# Patient Record
Sex: Male | Born: 1945
Health system: Southern US, Community
[De-identification: ages and names within clinical notes are randomized; demographics above are authoritative.]

## PROBLEM LIST (undated history)

## (undated) DIAGNOSIS — E119 Type 2 diabetes mellitus without complications: Secondary | ICD-10-CM

## (undated) DIAGNOSIS — R519 Headache, unspecified: Secondary | ICD-10-CM

## (undated) DIAGNOSIS — R0600 Dyspnea, unspecified: Secondary | ICD-10-CM

## (undated) DIAGNOSIS — I480 Paroxysmal atrial fibrillation: Secondary | ICD-10-CM

## (undated) DIAGNOSIS — I2510 Atherosclerotic heart disease of native coronary artery without angina pectoris: Secondary | ICD-10-CM

## (undated) DIAGNOSIS — I219 Acute myocardial infarction, unspecified: Secondary | ICD-10-CM

## (undated) DIAGNOSIS — K8020 Calculus of gallbladder without cholecystitis without obstruction: Secondary | ICD-10-CM

## (undated) DIAGNOSIS — I639 Cerebral infarction, unspecified: Secondary | ICD-10-CM

## (undated) DIAGNOSIS — T4145XA Adverse effect of unspecified anesthetic, initial encounter: Secondary | ICD-10-CM

## (undated) DIAGNOSIS — N200 Calculus of kidney: Secondary | ICD-10-CM

## (undated) DIAGNOSIS — K769 Liver disease, unspecified: Secondary | ICD-10-CM

## (undated) DIAGNOSIS — G8929 Other chronic pain: Secondary | ICD-10-CM

## (undated) DIAGNOSIS — I619 Nontraumatic intracerebral hemorrhage, unspecified: Secondary | ICD-10-CM

## (undated) DIAGNOSIS — M1990 Unspecified osteoarthritis, unspecified site: Secondary | ICD-10-CM

## (undated) DIAGNOSIS — H6690 Otitis media, unspecified, unspecified ear: Secondary | ICD-10-CM

## (undated) DIAGNOSIS — R51 Headache: Secondary | ICD-10-CM

## (undated) HISTORY — DX: Type 2 diabetes mellitus without complications: E11.9

## (undated) HISTORY — PX: CARPAL TUNNEL RELEASE: SHX101

## (undated) HISTORY — DX: Nontraumatic intracerebral hemorrhage, unspecified: I61.9

## (undated) HISTORY — DX: Atherosclerotic heart disease of native coronary artery without angina pectoris: I25.10

## (undated) HISTORY — DX: Calculus of kidney: N20.0

## (undated) HISTORY — DX: Paroxysmal atrial fibrillation: I48.0

## (undated) HISTORY — PX: HERNIA REPAIR: SHX51

## (undated) HISTORY — DX: Calculus of gallbladder without cholecystitis without obstruction: K80.20

## (undated) HISTORY — PX: KNEE ARTHROSCOPY: SUR90

## (undated) HISTORY — DX: Liver disease, unspecified: K76.9

## (undated) HISTORY — DX: Acute myocardial infarction, unspecified: I21.9

---

## 1977-03-28 DIAGNOSIS — H6690 Otitis media, unspecified, unspecified ear: Secondary | ICD-10-CM

## 1977-03-28 HISTORY — DX: Otitis media, unspecified, unspecified ear: H66.90

## 1995-03-29 DIAGNOSIS — C4491 Basal cell carcinoma of skin, unspecified: Secondary | ICD-10-CM

## 1995-03-29 HISTORY — DX: Basal cell carcinoma of skin, unspecified: C44.91

## 1998-03-28 DIAGNOSIS — Z8673 Personal history of transient ischemic attack (TIA), and cerebral infarction without residual deficits: Secondary | ICD-10-CM

## 1998-03-28 HISTORY — DX: Personal history of transient ischemic attack (TIA), and cerebral infarction without residual deficits: Z86.73

## 1998-06-26 ENCOUNTER — Inpatient Hospital Stay (HOSPITAL_COMMUNITY): Admission: EM | Admit: 1998-06-26 | Discharge: 1998-07-03 | Payer: Self-pay | Admitting: Emergency Medicine

## 1998-08-21 ENCOUNTER — Ambulatory Visit (HOSPITAL_COMMUNITY): Admission: RE | Admit: 1998-08-21 | Discharge: 1998-08-21 | Payer: Self-pay | Admitting: Neurosurgery

## 1998-08-25 ENCOUNTER — Ambulatory Visit (HOSPITAL_COMMUNITY): Admission: RE | Admit: 1998-08-25 | Discharge: 1998-08-25 | Payer: Self-pay | Admitting: Neurosurgery

## 1998-08-28 ENCOUNTER — Ambulatory Visit (HOSPITAL_COMMUNITY): Admission: RE | Admit: 1998-08-28 | Discharge: 1998-08-28 | Payer: Self-pay | Admitting: Neurosurgery

## 1999-09-15 ENCOUNTER — Ambulatory Visit (HOSPITAL_COMMUNITY): Admission: RE | Admit: 1999-09-15 | Discharge: 1999-09-15 | Payer: Self-pay | Admitting: Neurosurgery

## 2001-03-28 DIAGNOSIS — I219 Acute myocardial infarction, unspecified: Secondary | ICD-10-CM

## 2001-03-28 HISTORY — DX: Acute myocardial infarction, unspecified: I21.9

## 2002-02-15 ENCOUNTER — Inpatient Hospital Stay (HOSPITAL_COMMUNITY): Admission: RE | Admit: 2002-02-15 | Discharge: 2002-02-18 | Payer: Self-pay | Admitting: Cardiology

## 2002-02-15 ENCOUNTER — Encounter: Payer: Self-pay | Admitting: Cardiology

## 2002-02-17 ENCOUNTER — Encounter: Payer: Self-pay | Admitting: *Deleted

## 2003-03-29 HISTORY — PX: CORONARY STENT PLACEMENT: SHX1402

## 2013-01-24 ENCOUNTER — Ambulatory Visit: Payer: Self-pay | Admitting: General Practice

## 2013-10-16 ENCOUNTER — Encounter (INDEPENDENT_AMBULATORY_CARE_PROVIDER_SITE_OTHER): Payer: Self-pay

## 2013-10-16 ENCOUNTER — Encounter: Payer: Self-pay | Admitting: Family Medicine

## 2013-10-16 ENCOUNTER — Ambulatory Visit (INDEPENDENT_AMBULATORY_CARE_PROVIDER_SITE_OTHER): Payer: Commercial Managed Care - HMO | Admitting: Family Medicine

## 2013-10-16 VITALS — BP 142/75 | HR 74 | Temp 98.0°F | Ht 65.75 in | Wt 227.0 lb

## 2013-10-16 DIAGNOSIS — I6990 Unspecified sequelae of unspecified cerebrovascular disease: Secondary | ICD-10-CM

## 2013-10-16 DIAGNOSIS — E785 Hyperlipidemia, unspecified: Secondary | ICD-10-CM

## 2013-10-16 DIAGNOSIS — Z0000 Encounter for general adult medical examination without abnormal findings: Secondary | ICD-10-CM

## 2013-10-16 DIAGNOSIS — I2510 Atherosclerotic heart disease of native coronary artery without angina pectoris: Secondary | ICD-10-CM

## 2013-10-16 LAB — POCT CBC
GRANULOCYTE PERCENT: 63.8 % (ref 37–80)
HCT, POC: 49.3 % (ref 43.5–53.7)
Hemoglobin: 16.3 g/dL (ref 14.1–18.1)
Lymph, poc: 2.9 (ref 0.6–3.4)
MCH, POC: 30.8 pg (ref 27–31.2)
MCHC: 33 g/dL (ref 31.8–35.4)
MCV: 93.3 fL (ref 80–97)
MPV: 9.5 fL (ref 0–99.8)
PLATELET COUNT, POC: 202 10*3/uL (ref 142–424)
POC Granulocyte: 5.9 (ref 2–6.9)
POC LYMPH PERCENT: 31.4 %L (ref 10–50)
RBC: 5.3 M/uL (ref 4.69–6.13)
RDW, POC: 12.3 %
WBC: 9.3 10*3/uL (ref 4.6–10.2)

## 2013-10-16 NOTE — Patient Instructions (Signed)
Consider Immunizations: Shingles, pneumonia, and Tdap. Review handouts on these.

## 2013-10-16 NOTE — Progress Notes (Signed)
   Subjective:    Patient ID: Daniel Short, male    DOB: 10-06-1945, 68 y.o.   MRN: [id number]  HPI 24 your gentleman here today for a physical at the request of his insurance company. He admits that he does not like to go to the doctor much generally feels well except for being overweight. The more he talks information comes out apparently has some stroke or cerebral hemorrhage hemorrhage in the past at which time coronary artery blockages were found and he had 2 stents placed he does not have any knowledge of what the insurance covered it would like to have done today but my suspicion is that would like blood glucose and lipids ordered with consideration to updating taxanes such as pneumococcal vaccine and tdap    Review of Systems  Constitutional: Negative.   HENT: Negative.   Eyes: Negative.   Respiratory: Positive for wheezing.   Cardiovascular: Negative.   Gastrointestinal: Negative.   Endocrine: Negative.   Genitourinary: Negative.   Musculoskeletal: Negative.   Skin: Negative.   Neurological: Negative.   Psychiatric/Behavioral: Negative.        Objective:   Physical Exam  Nursing note and vitals reviewed. Constitutional: He is oriented to person, place, and time. He appears well-developed and well-nourished.  Pt is obese and admits to same  HENT:  Head: Normocephalic.  Right Ear: External ear normal.  Left Ear: External ear normal.  Nose: Nose normal.  Mouth/Throat: Oropharynx is clear and moist.  Eyes: Conjunctivae and EOM are normal. Pupils are equal, round, and reactive to light.  Neck: Normal range of motion. Neck supple.  Cardiovascular: Normal rate, regular rhythm, normal heart sounds and intact distal pulses.   Pulmonary/Chest: Effort normal and breath sounds normal.  Abdominal: Soft. Bowel sounds are normal.  Musculoskeletal: Normal range of motion.  Neurological: He is alert and oriented to person, place, and time.  Skin: Skin is warm and dry.    Psychiatric: He has a normal mood and affect. His behavior is normal. Judgment and thought content normal.          Assessment & Plan:

## 2013-10-17 LAB — HEPATITIS C ANTIBODY: Hep C Virus Ab: <0.1 s/co ratio (ref 0.0–0.9)

## 2013-10-17 LAB — CMP14+EGFR
A/G RATIO: 2 (ref 1.1–2.5)
ALK PHOS: 47 IU/L (ref 39–117)
ALT: 25 IU/L (ref 0–44)
AST: 22 IU/L (ref 0–40)
Albumin: 4.3 g/dL (ref 3.6–4.8)
BUN / CREAT RATIO: 8 — AB (ref 10–22)
BUN: 9 mg/dL (ref 8–27)
CALCIUM: 10 mg/dL (ref 8.6–10.2)
CO2: 25 mmol/L (ref 18–29)
Chloride: 100 mmol/L (ref 97–108)
Creatinine, Ser: 1.06 mg/dL (ref 0.76–1.27)
GFR calc Af Amer: 83 mL/min/{1.73_m2} (ref >59)
GFR, EST NON AFRICAN AMERICAN: 72 mL/min/{1.73_m2} (ref >59)
Globulin, Total: 2.2 g/dL (ref 1.5–4.5)
Glucose: 75 mg/dL (ref 65–99)
Potassium: 4.5 mmol/L (ref 3.5–5.2)
SODIUM: 141 mmol/L (ref 134–144)
Total Bilirubin: 0.8 mg/dL (ref 0.0–1.2)
Total Protein: 6.5 g/dL (ref 6.0–8.5)

## 2013-10-17 LAB — LIPID PANEL
CHOL/HDL RATIO: 8.2 ratio — AB (ref 0.0–5.0)
Cholesterol, Total: 230 mg/dL — ABNORMAL HIGH (ref 100–199)
HDL: 28 mg/dL — AB (ref >39)
LDL Calculated: 145 mg/dL — ABNORMAL HIGH (ref 0–99)
TRIGLYCERIDES: 286 mg/dL — AB (ref 0–149)
VLDL Cholesterol Cal: 57 mg/dL — ABNORMAL HIGH (ref 5–40)

## 2013-10-21 ENCOUNTER — Other Ambulatory Visit: Payer: Self-pay | Admitting: Family Medicine

## 2013-10-22 ENCOUNTER — Telehealth: Payer: Self-pay | Admitting: Family Medicine

## 2013-10-22 MED ORDER — ATORVASTATIN CALCIUM 20 MG PO TABS: 20.0000 mg | ORAL_TABLET | Freq: Every day | ORAL | Status: DC

## 2013-10-22 NOTE — Telephone Encounter (Signed)
rx sent in per [facility] on labs

## 2013-11-27 DIAGNOSIS — E785 Hyperlipidemia, unspecified: Secondary | ICD-10-CM

## 2013-11-27 DIAGNOSIS — I2510 Atherosclerotic heart disease of native coronary artery without angina pectoris: Secondary | ICD-10-CM | POA: Insufficient documentation

## 2013-11-27 DIAGNOSIS — E1169 Type 2 diabetes mellitus with other specified complication: Secondary | ICD-10-CM | POA: Insufficient documentation

## 2013-11-27 NOTE — Progress Notes (Addendum)
   Subjective:    Patient ID: Daniel Short, male    DOB: [birth year], 68 y.o.   MRN: [id number]  HPI    Review of Systems     Objective:   Physical Exam        Assessment & Plan:  1. Coronary atherosclerosis of unspecified type of vessel, native or graft Since on statin, check labs - Lipid panel - CMP14+EGFR  2. Unspecified late effects of cerebrovascular disease No sequela  3. Routine general medical examination at a health care facility Seems to be doing well - POCT CBC - Hepatitis C antibody  4. Other and unspecified hyperlipidemia   Wardell Honour MD

## 2014-09-23 ENCOUNTER — Telehealth: Payer: Self-pay | Admitting: Family Medicine

## 2014-10-14 DIAGNOSIS — L820 Inflamed seborrheic keratosis: Secondary | ICD-10-CM | POA: Diagnosis not present

## 2014-10-14 DIAGNOSIS — D485 Neoplasm of uncertain behavior of skin: Secondary | ICD-10-CM | POA: Diagnosis not present

## 2014-11-24 ENCOUNTER — Ambulatory Visit (INDEPENDENT_AMBULATORY_CARE_PROVIDER_SITE_OTHER): Payer: Commercial Managed Care - HMO | Admitting: Family Medicine

## 2014-11-24 ENCOUNTER — Encounter: Payer: Self-pay | Admitting: Family Medicine

## 2014-11-24 VITALS — BP 146/86 | HR 77 | Temp 97.3°F | Ht 65.75 in | Wt 221.8 lb

## 2014-11-24 DIAGNOSIS — Z87891 Personal history of nicotine dependence: Secondary | ICD-10-CM | POA: Insufficient documentation

## 2014-11-24 DIAGNOSIS — Z720 Tobacco use: Secondary | ICD-10-CM

## 2014-11-24 DIAGNOSIS — Z0000 Encounter for general adult medical examination without abnormal findings: Secondary | ICD-10-CM

## 2014-11-24 DIAGNOSIS — E785 Hyperlipidemia, unspecified: Secondary | ICD-10-CM

## 2014-11-24 DIAGNOSIS — I2510 Atherosclerotic heart disease of native coronary artery without angina pectoris: Secondary | ICD-10-CM

## 2014-11-24 DIAGNOSIS — E669 Obesity, unspecified: Secondary | ICD-10-CM

## 2014-11-24 MED ORDER — ATORVASTATIN CALCIUM 20 MG PO TABS: 20.0000 mg | ORAL_TABLET | Freq: Every day | ORAL | Status: DC

## 2014-11-24 NOTE — Progress Notes (Signed)
   HPI  Patient presents today for annual physical  HLD Not taking meds Not exercising, mows once a week - has Bl leg/knee swelling (r knee >L) without paiun and general low back soreness with activity Not watching diet  Smoking- has cut back, still 3-4 per day  CAD- previous heart attack with stents X 2 No Chest pain, unusual dyspnea, or palpitations  Obesity- no exercise- diet improvements as above  PMH: Smoking status noted Past medical =, surgical, and social Hx reviewed and updated in EMR ROS: Per HPI, otherwise enegative  Objective: BP 146/86 mmHg  Pulse 77  Temp(Src) 97.3 F (36.3 C) (Oral)  Ht 5' 5.75" (1.67 m)  Wt 221 lb 12.8 oz (100.608 kg)  BMI 36.07 kg/m2 Gen: NAD, alert, cooperative with exam HEENT: NCAT, Tms and nares WNL, oropharaynx clear Neck supple- trachea midline CV: RRR, good S1/S2, no murmur Resp: CTABL, no wheezes, non-labored Abd: SNTND, BS present, no guarding or organomegaly Ext: No edema, warm Neuro: Alert and oriented, No gross deficits  Assessment and plan:  # HLD Lipitor- agree wiwth dr. Miller Repeat labs 2 moinths, check up 1 week later Diet and exercise discussed  # Tobacco abuse offerd help, and clinical pharmacist congratulated on progress He is thinking about cutting back but not ready to quit  # obesity Diet adn exercise as above  # annual physical Labs, diet and exercise Declined prostate testing- offered/discussed PSA and rectal Declined C scope    Orders Placed This Encounter  Procedures  . CMP14+EGFR    Standing Status: Future     Number of Occurrences:      Standing Expiration Date: 11/24/2015  . Lipid Panel    Standing Status: Future     Number of Occurrences:      Standing Expiration Date: 11/24/2015    Meds ordered this encounter  Medications  . atorvastatin (LIPITOR) 20 MG tablet    Sig: Take 1 tablet (20 mg total) by mouth daily.    Dispense:  30 tablet    Refill:  3    Sam Bradshaw,  MD [facility] 11/24/2014, 2:48 PM      

## 2014-11-24 NOTE — Patient Instructions (Signed)
Great to meet you!  You have high cholesterol, we have sent lipitor for you to start  We should re-check your labs in 2 months, make a lab appt and follow up in the clinic after that.   Try to cut down on fried and fatty foods.   High Cholesterol High cholesterol refers to having a high level of cholesterol in your blood. Cholesterol is a white, waxy, fat-like protein that your body needs in small amounts. Your liver makes all the cholesterol you need. Excess cholesterol comes from the food you eat. Cholesterol travels in your bloodstream through your blood vessels. If you have high cholesterol, deposits (plaque) may build up on the walls of your blood vessels. This makes the arteries narrower and stiffer. Plaque increases your risk of heart attack and stroke. Work with your health care provider to keep your cholesterol levels in a healthy range. RISK FACTORS Several things can make you more likely to have high cholesterol. These include:   Eating foods high in animal fat (saturated fat) or cholesterol.  Being overweight.  Not getting enough exercise.  Having a family history of high cholesterol. SIGNS AND SYMPTOMS High cholesterol does not cause symptoms. DIAGNOSIS  Your health care provider can do a blood test to check whether you have high cholesterol. If you are older than 20, your health care provider may check your cholesterol every 4-6 years. You may be checked more often if you already have high cholesterol or other risk factors for heart disease. The blood test for cholesterol measures the following:  Bad cholesterol (LDL cholesterol). This is the type of cholesterol that causes heart disease. This number should be less than 100.  Good cholesterol (HDL cholesterol). This type helps protect against heart disease. A healthy level of HDL cholesterol is 60 or higher.  Total cholesterol. This is the combined number of LDL cholesterol and HDL cholesterol. A healthy number is less  than 200. TREATMENT  High cholesterol can be treated with diet changes, lifestyle changes, and medicine.   Diet changes may include eating more whole grains, fruits, vegetables, nuts, and fish. You may also have to cut back on red meat and foods with a lot of added sugar.  Lifestyle changes may include getting at least 40 minutes of aerobic exercise three times a week. Aerobic exercises include walking, biking, and swimming. Aerobic exercise along with a healthy diet can help you maintain a healthy weight. Lifestyle changes may also include quitting smoking.  If diet and lifestyle changes are not enough to lower your cholesterol, your health care provider may prescribe a statin medicine. This medicine has been shown to lower cholesterol and also lower the risk of heart disease. HOME CARE INSTRUCTIONS  Only take over-the-counter or prescription medicines as directed by your health care provider.   Follow a healthy diet as directed by your health care provider. For instance:   Eat chicken (without skin), fish, veal, shellfish, ground [place] breast, and round or loin cuts of red meat.  Do not eat fried foods and fatty meats, such as hot dogs and salami.   Eat plenty of fruits, such as apples.   Eat plenty of vegetables, such as broccoli, potatoes, and carrots.   Eat beans, peas, and lentils.   Eat grains, such as barley, rice, couscous, and bulgur wheat.   Eat pasta without cream sauces.   Use skim or nonfat milk and low-fat or nonfat yogurt and cheeses. Do not eat or drink whole milk, cream, ice  cream, egg yolks, and hard cheeses.   Do not eat stick margarine or tub margarines that contain trans fats (also called partially hydrogenated oils).   Do not eat cakes, cookies, crackers, or other baked goods that contain trans fats.   Do not eat saturated tropical oils, such as coconut and palm oil.   Exercise as directed by your health care provider. Increase your activity  level with activities such as gardening or walking.   Keep all follow-up appointments.  SEEK MEDICAL CARE IF:  You are struggling to maintain a healthy diet or weight.  You need help starting an exercise program.  You need help to stop smoking. SEEK IMMEDIATE MEDICAL CARE IF:  You have chest pain.  You have trouble breathing. Document Released: 03/14/2005 Document Revised: 07/29/2013 Document Reviewed: 01/04/2013 [facility] Patient Information 2015 [place], [place]. This information is not intended to replace advice given to you by your health care provider. Make sure you discuss any questions you have with your health care provider.

## 2015-02-18 ENCOUNTER — Telehealth: Payer: Self-pay | Admitting: Family Medicine

## 2015-02-24 NOTE — Telephone Encounter (Signed)
Patient needs a flu shot and an FOBT to close quality gap for this year.

## 2015-04-16 ENCOUNTER — Ambulatory Visit (INDEPENDENT_AMBULATORY_CARE_PROVIDER_SITE_OTHER): Payer: Commercial Managed Care - HMO | Admitting: Family Medicine

## 2015-04-16 ENCOUNTER — Encounter: Payer: Self-pay | Admitting: Family Medicine

## 2015-04-16 VITALS — BP 133/84 | HR 68 | Temp 96.8°F | Ht 65.75 in | Wt 223.8 lb

## 2015-04-16 DIAGNOSIS — Z720 Tobacco use: Secondary | ICD-10-CM

## 2015-04-16 DIAGNOSIS — E785 Hyperlipidemia, unspecified: Secondary | ICD-10-CM

## 2015-04-16 DIAGNOSIS — Z0000 Encounter for general adult medical examination without abnormal findings: Secondary | ICD-10-CM | POA: Insufficient documentation

## 2015-04-16 DIAGNOSIS — I2510 Atherosclerotic heart disease of native coronary artery without angina pectoris: Secondary | ICD-10-CM | POA: Diagnosis not present

## 2015-04-16 DIAGNOSIS — R8299 Other abnormal findings in urine: Secondary | ICD-10-CM | POA: Diagnosis not present

## 2015-04-16 DIAGNOSIS — E875 Hyperkalemia: Secondary | ICD-10-CM

## 2015-04-16 DIAGNOSIS — E669 Obesity, unspecified: Secondary | ICD-10-CM

## 2015-04-16 DIAGNOSIS — R82998 Other abnormal findings in urine: Secondary | ICD-10-CM

## 2015-04-16 DIAGNOSIS — R17 Unspecified jaundice: Secondary | ICD-10-CM

## 2015-04-16 LAB — POCT URINALYSIS DIPSTICK
BILIRUBIN UA: NEGATIVE
Glucose, UA: NEGATIVE
KETONES UA: NEGATIVE
Nitrite, UA: NEGATIVE
PH UA: 6
Protein, UA: NEGATIVE
RBC UA: NEGATIVE
SPEC GRAV UA: 1.02
Urobilinogen, UA: NEGATIVE

## 2015-04-16 LAB — POCT UA - MICROSCOPIC ONLY
Bacteria, U Microscopic: NEGATIVE
CASTS, UR, LPF, POC: NEGATIVE
Crystals, Ur, HPF, POC: NEGATIVE
EPITHELIAL CELLS, URINE PER MICROSCOPY: NEGATIVE
MUCUS UA: NEGATIVE
RBC, urine, microscopic: NEGATIVE
YEAST UA: NEGATIVE

## 2015-04-16 LAB — POCT GLYCOSYLATED HEMOGLOBIN (HGB A1C): Hemoglobin A1C: 5.9

## 2015-04-16 NOTE — Progress Notes (Signed)
   HPI  Patient presents today today for follow-up and discuss back pain.  Hyperlipidemia Not really watching his diet He is taking Lipitor. No myalgias  Back pain Described as left-sided intermittent aggravating type pain that comes and goes without aggravating or alleviating factors. No radiation No bowel or bladder incontinence, leg weakness, or saddle anesthesia.  Smoking Not really interested in quitting, has cut back to less than a half a pack per day.  Healthcare maintenance Not interested in flu shots His wife has had several colonoscopies and he is beginning to think about colonoscopy. He has seen blood in his stool  Reports dark urine for a week or 2 No blood No dysuria, hematuria, foul-smelling urine, persistent back pain, fever, or current illness  He discloses that he drinks mostly sodas and eats honey buns every morning for breakfast   PMH: Smoking status noted ROS: Per HPI  Objective: BP 133/84 mmHg  Pulse 68  Temp(Src) 96.8 F (36 C) (Oral)  Ht 5' 5.75" (1.67 m)  Wt 223 lb 12.8 oz (101.515 kg)  BMI 36.40 kg/m2 Gen: NAD, alert, cooperative with exam HEENT: NCAT CV: RRR, good S1/S2, no murmur Resp: Nonlabored, inspiratory and expiratory wheezes scattered throughout Ext: No edema, warm Neuro: Alert and oriented, No gross deficits  MSK: No paraspinal muscle or bony tenderness of the low back  Assessment and plan:  # Dark urine Discussed possible dehydration He has been drinking mostly sodas, hardly any water at all  # Hyperlipidemia Continue atorvastatin Labs  # HealthCare maintenance Discussed colonoscopy at length, he will discuss this with his wife, FOBT card given  # Sacroiliac pain Given stretches for rehabilitation.   Orders Placed This Encounter  Procedures  . Lipid panel  . CMP14+EGFR  . POCT UA - Microscopic Only  . POCT urinalysis dipstick  . POCT glycosylated hemoglobin (Hb A1C)     Laroy Apple, MD [facility] 04/16/2015, 2:25 PM

## 2015-04-16 NOTE — Patient Instructions (Addendum)
Great to meet you!  Consider the colonoscopy, Check your stool for blood using the kit I sent with you  Continue your lipitor  We will call with results within 1 week  Come back in 4 months  Try the exercises for your back pain

## 2015-04-17 LAB — CMP14+EGFR
A/G RATIO: 1.8 (ref 1.1–2.5)
ALT: 25 IU/L (ref 0–44)
AST: 22 IU/L (ref 0–40)
Albumin: 4.6 g/dL (ref 3.5–4.8)
Alkaline Phosphatase: 51 IU/L (ref 39–117)
BUN/Creatinine Ratio: 8 — ABNORMAL LOW (ref 10–22)
BUN: 8 mg/dL (ref 8–27)
Bilirubin Total: 1.3 mg/dL — ABNORMAL HIGH (ref 0.0–1.2)
CALCIUM: 9.9 mg/dL (ref 8.6–10.2)
CO2: 24 mmol/L (ref 18–29)
Chloride: 100 mmol/L (ref 96–106)
Creatinine, Ser: 1.02 mg/dL (ref 0.76–1.27)
GFR, EST AFRICAN AMERICAN: 86 mL/min/{1.73_m2} (ref >59)
GFR, EST NON AFRICAN AMERICAN: 74 mL/min/{1.73_m2} (ref >59)
GLOBULIN, TOTAL: 2.6 g/dL (ref 1.5–4.5)
Glucose: 84 mg/dL (ref 65–99)
POTASSIUM: 5.4 mmol/L — AB (ref 3.5–5.2)
SODIUM: 143 mmol/L (ref 134–144)
TOTAL PROTEIN: 7.2 g/dL (ref 6.0–8.5)

## 2015-04-17 LAB — LIPID PANEL
CHOLESTEROL TOTAL: 114 mg/dL (ref 100–199)
Chol/HDL Ratio: 3.9 ratio units (ref 0.0–5.0)
HDL: 29 mg/dL — ABNORMAL LOW (ref >39)
LDL Calculated: 55 mg/dL (ref 0–99)
Triglycerides: 149 mg/dL (ref 0–149)
VLDL Cholesterol Cal: 30 mg/dL (ref 5–40)

## 2015-04-17 NOTE — Addendum Note (Signed)
Addended by: Thana Ates on: 04/17/2015 02:56 PM   Modules accepted: Orders

## 2015-08-20 ENCOUNTER — Other Ambulatory Visit: Payer: Self-pay | Admitting: Family Medicine

## 2015-10-28 ENCOUNTER — Other Ambulatory Visit: Payer: Self-pay | Admitting: Family Medicine

## 2015-11-03 ENCOUNTER — Telehealth: Payer: Self-pay | Admitting: Family Medicine

## 2015-11-03 DIAGNOSIS — Z139 Encounter for screening, unspecified: Secondary | ICD-10-CM

## 2015-11-03 NOTE — Telephone Encounter (Signed)
Pt called to request referral to Dr Denna Haggard for annual skin check referral entered in epic Pt notified

## 2015-11-06 DIAGNOSIS — L820 Inflamed seborrheic keratosis: Secondary | ICD-10-CM | POA: Diagnosis not present

## 2015-11-06 DIAGNOSIS — D485 Neoplasm of uncertain behavior of skin: Secondary | ICD-10-CM | POA: Diagnosis not present

## 2015-11-06 DIAGNOSIS — L821 Other seborrheic keratosis: Secondary | ICD-10-CM | POA: Diagnosis not present

## 2015-12-29 ENCOUNTER — Other Ambulatory Visit: Payer: Self-pay | Admitting: Pediatrics

## 2016-05-19 ENCOUNTER — Ambulatory Visit (INDEPENDENT_AMBULATORY_CARE_PROVIDER_SITE_OTHER): Payer: Medicare HMO

## 2016-05-19 ENCOUNTER — Ambulatory Visit (INDEPENDENT_AMBULATORY_CARE_PROVIDER_SITE_OTHER): Payer: Medicare HMO | Admitting: Family Medicine

## 2016-05-19 ENCOUNTER — Encounter: Payer: Self-pay | Admitting: Family Medicine

## 2016-05-19 VITALS — BP 171/100 | HR 85 | Temp 97.5°F | Ht 65.75 in | Wt 226.4 lb

## 2016-05-19 DIAGNOSIS — M5441 Lumbago with sciatica, right side: Secondary | ICD-10-CM | POA: Diagnosis not present

## 2016-05-19 DIAGNOSIS — G8929 Other chronic pain: Secondary | ICD-10-CM

## 2016-05-19 DIAGNOSIS — I10 Essential (primary) hypertension: Secondary | ICD-10-CM

## 2016-05-19 DIAGNOSIS — E1159 Type 2 diabetes mellitus with other circulatory complications: Secondary | ICD-10-CM | POA: Insufficient documentation

## 2016-05-19 DIAGNOSIS — E785 Hyperlipidemia, unspecified: Secondary | ICD-10-CM

## 2016-05-19 DIAGNOSIS — R030 Elevated blood-pressure reading, without diagnosis of hypertension: Secondary | ICD-10-CM | POA: Diagnosis not present

## 2016-05-19 DIAGNOSIS — M5442 Lumbago with sciatica, left side: Secondary | ICD-10-CM

## 2016-05-19 DIAGNOSIS — M25641 Stiffness of right hand, not elsewhere classified: Secondary | ICD-10-CM | POA: Diagnosis not present

## 2016-05-19 NOTE — Progress Notes (Signed)
HPI  Patient presents today with back pain, hand pain, and review of chronic medical conditions.  Back pain Bilateral low back pain been going on for more than one year, described as dull achy back pain worse with walking with intermittent bilateral radiation down the posterior leg. No leg weakness, numbness or laying in the legs, loss of sensation in the saddle area, or bowel or bladder dysfunction.  Hand swelling Right hand pain and swelling and numbness and tingling across the first 3 fingers for about one week. Patient states that the swelling is most predominant with pain across the posterior hand from the second to fourth MCPs. No Injury His wife is concerned it is RA  Elevated blood pressure Previously noted elevated blood pressure Denies any increased salt intake Denies chest pain, headache, or dyspnea.  Hyperlipidemia Taking statin every other day, previously stopped for hand numbness and tingling similar to his that he is experiencing currently This did not improve with stopping   PMH: Smoking status noted ROS: Per HPI  Objective: BP (!) 171/100   Pulse 85   Temp 97.5 F (36.4 C) (Oral)   Ht 5' 5.75" (1.67 m)   Wt 226 lb 6.4 oz (102.7 kg)   BMI 36.82 kg/m  Gen: NAD, alert, cooperative with exam HEENT: NCAT, EOMI, PERRL CV: RRR, good S1/S2, no murmur Resp: CTABL, no wheezes, non-labored Abd: SNTND, BS present, no guarding or organomegaly Ext: No edema, warm Neuro: Alert and oriented, No gross deficits  Assessment and plan:  # Elevated blood pressure without diagnosis of hypertension Blood pressure log, return to clinic in 4-6 weeks.  # Bilateral back pain with bilateral sciatica, chronic Plain film today Symptoms slightly worse No red flags Consider gabapentin, Cymbalta Discussed avoiding NSAIDs with history of heart disease  # Joint stiffness hand Plain film pending Trial of Tylenol Labs to rule out RA Patient's main complaint is stiffness and  pain across the 2nd through fourth MCP, consider carpal tunnel syndrome considering numbness and tingling pain, Tinel's sign is negative  hyperlipidemia [place] statin, checking labs, nonfasting so LDL was drawn    Orders Placed This Encounter  Procedures  . DG Lumbar Spine 2-3 Views    Standing Status:   Future    Number of Occurrences:   1    Standing Expiration Date:   07/19/2017    Order Specific Question:   Reason for Exam (SYMPTOM  OR DIAGNOSIS REQUIRED)    Answer:   low back pain >1 year, intermittent scaitica BL    Order Specific Question:   Preferred imaging location?    Answer:   Internal  . DG Hand Complete Right    Standing Status:   Future    Number of Occurrences:   1    Standing Expiration Date:   07/17/2017    Order Specific Question:   Reason for Exam (SYMPTOM  OR DIAGNOSIS REQUIRED)    Answer:   R hand swelling and pain X 1 week    Order Specific Question:   Preferred imaging location?    Answer:   Internal  . Sedimentation Rate  . Rheumatoid factor  . LDL Cholesterol, Direct  . CMP14+EGFR  . CBC with [place], MD [facility] 05/19/2016, 5:25 PM

## 2016-05-19 NOTE — Patient Instructions (Signed)
Great to see you!  Stop smoking, avoid ibuprofen and aleve Tylenol 2 pills 2-3 times a day for pain.   We will call with labs and x ray results within 1 week  Come back in 1-2 months with a blood pressure log

## 2016-05-20 LAB — CMP14+EGFR
ALK PHOS: 54 IU/L (ref 39–117)
ALT: 22 IU/L (ref 0–44)
AST: 21 IU/L (ref 0–40)
Albumin/Globulin Ratio: 1.8 (ref 1.2–2.2)
Albumin: 4.2 g/dL (ref 3.5–4.8)
BUN/Creatinine Ratio: 9 — ABNORMAL LOW (ref 10–24)
BUN: 8 mg/dL (ref 8–27)
Bilirubin Total: 1.1 mg/dL (ref 0.0–1.2)
CALCIUM: 9.4 mg/dL (ref 8.6–10.2)
CO2: 24 mmol/L (ref 18–29)
CREATININE: 0.89 mg/dL (ref 0.76–1.27)
Chloride: 100 mmol/L (ref 96–106)
GFR calc Af Amer: 99 (ref >59)
GFR, EST NON AFRICAN AMERICAN: 86 (ref >59)
GLOBULIN, TOTAL: 2.3 (ref 1.5–4.5)
GLUCOSE: 93 mg/dL (ref 65–99)
Potassium: 4.4 mmol/L (ref 3.5–5.2)
SODIUM: 141 mmol/L (ref 134–144)
Total Protein: 6.5 g/dL (ref 6.0–8.5)

## 2016-05-20 LAB — CBC WITH DIFFERENTIAL/PLATELET
BASOS: 1 %
Basophils Absolute: 0.1 10*3/uL (ref 0.0–0.2)
EOS (ABSOLUTE): 0.2 10*3/uL (ref 0.0–0.4)
EOS: 2 %
HEMOGLOBIN: 14.9 g/dL (ref 13.0–17.7)
Hematocrit: 44.5 % (ref 37.5–51.0)
IMMATURE GRANS (ABS): 0 10*3/uL (ref 0.0–0.1)
IMMATURE GRANULOCYTES: 0 %
Lymphocytes Absolute: 3.2 10*3/uL — ABNORMAL HIGH (ref 0.7–3.1)
Lymphs: 34 %
MCH: 30.7 pg (ref 26.6–33.0)
MCHC: 33.5 g/dL (ref 31.5–35.7)
MCV: 92 fL (ref 79–97)
MONOCYTES: 11 %
Monocytes Absolute: 1.1 10*3/uL — ABNORMAL HIGH (ref 0.1–0.9)
NEUTROS PCT: 52 %
Neutrophils Absolute: 4.9 10*3/uL (ref 1.4–7.0)
Platelets: 205 10*3/uL (ref 150–379)
RBC: 4.85 x10E6/uL (ref 4.14–5.80)
RDW: 12.8 % (ref 12.3–15.4)
WBC: 9.4 10*3/uL (ref 3.4–10.8)

## 2016-05-20 LAB — LDL CHOLESTEROL, DIRECT: LDL Direct: 82 mg/dL (ref 0–99)

## 2016-05-20 LAB — RHEUMATOID FACTOR

## 2016-05-20 LAB — SEDIMENTATION RATE: Sed Rate: 7 mm/hr (ref 0–30)

## 2016-06-21 ENCOUNTER — Ambulatory Visit (INDEPENDENT_AMBULATORY_CARE_PROVIDER_SITE_OTHER): Payer: Medicare HMO | Admitting: Family Medicine

## 2016-06-21 ENCOUNTER — Encounter: Payer: Self-pay | Admitting: Family Medicine

## 2016-06-21 VITALS — BP 142/78 | HR 87 | Temp 96.9°F | Ht 65.75 in | Wt 227.0 lb

## 2016-06-21 DIAGNOSIS — M79641 Pain in right hand: Secondary | ICD-10-CM | POA: Diagnosis not present

## 2016-06-21 DIAGNOSIS — M79642 Pain in left hand: Secondary | ICD-10-CM | POA: Diagnosis not present

## 2016-06-21 DIAGNOSIS — I10 Essential (primary) hypertension: Secondary | ICD-10-CM | POA: Diagnosis not present

## 2016-06-21 DIAGNOSIS — G8929 Other chronic pain: Secondary | ICD-10-CM

## 2016-06-21 DIAGNOSIS — M545 Low back pain: Secondary | ICD-10-CM

## 2016-06-21 MED ORDER — HYDROCHLOROTHIAZIDE 12.5 MG PO CAPS: 12.5000 mg | ORAL_CAPSULE | Freq: Every day | ORAL | 3 refills | Status: DC

## 2016-06-21 MED ORDER — GABAPENTIN 300 MG PO CAPS: 300.0000 mg | ORAL_CAPSULE | Freq: Three times a day (TID) | ORAL | 3 refills | Status: DC

## 2016-06-21 NOTE — Progress Notes (Signed)
   HPI  Patient presents today to follow-up for hypertension  Complains of hand pain, tingling pain across the knuckles on bilateral hands and swelling. Labs were negative for rheumatoid arthritis last month. Patient states his pain is been going on for several months.  Patient also has chronic back pain. Patient states he has bilateral low back pain with sciatica mostly in the mornings, he also has flares occasionally the sciatica is difficult to tolerate.  High blood pressure Patient brings in a log today blood pressures ranging 903 systolic to 833 systolic over 38V and 29V. 13 of the 25 blood pressures recorded are above 916 systolic. His wife like him to start medication.  PMH: Smoking status noted ROS: Per HPI  Objective: BP (!) 142/78   Pulse 87   Temp (!) 96.9 F (36.1 C) (Oral)   Ht 5' 5.75" (1.67 m)   Wt 227 lb (103 kg)   BMI 36.92 kg/m  Gen: NAD, alert, cooperative with exam HEENT: NCAT, EOMI, PERRL CV: RRR, good S1/S2, no murmur Resp: CTABL, no wheezes, non-labored Ext: No edema, warm Neuro: Alert and oriented, plus symmetric patellar tendon reflexes Strength 5/5 and sensation intact in bilateral lower extremities  MSK Mild tenderness to palpation in bilateral lumbar spine and the paraspinal muscles and mild tenderness over the bony prominences and lumbar area  Assessment and plan:  # Hypertension New diagnosis Start HCTZ Follow-up 3-4 weeks, repeat labs at that time  # Hand pain Most likely osteoarthritis Patient does complain of swelling and tingling type pain across the bilateral MCPs. Gabapentin also given for nighttime pain as well as possible neuropathic pain on the back No median nerve distribution, no fingertip pain, no warm erythema to cause concern for inflammatory arthritis Previous RF and ESR normal  # Back pain With intermittent bilateral sciatica Starting gabapentin Follow-up 3-4 weeks   Meds ordered this encounter  Medications  .  hydrochlorothiazide (MICROZIDE) 12.5 MG capsule    Sig: Take 1 capsule (12.5 mg total) by mouth daily.    Dispense:  30 capsule    Refill:  3  . gabapentin (NEURONTIN) 300 MG capsule    Sig: Take 1 capsule (300 mg total) by mouth 3 (three) times daily.    Dispense:  90 capsule    Refill:  [place], MD [facility] 06/21/2016, 5:26 PM

## 2016-06-21 NOTE — Patient Instructions (Signed)
Great to see you!  I have started hydrochlorothiazide for blood pressure.   I have started gabapentin for pain. Start with 1 pill once a day at night for 1 week, then add a daytime dose in the morning if it doesn't make you sleepy. After another week you can start an afternoon dose as well.   Come back in 1 month for follow up for blood pressure and pain.

## 2016-07-14 ENCOUNTER — Telehealth: Payer: Self-pay | Admitting: Family Medicine

## 2016-07-14 NOTE — Telephone Encounter (Signed)
appt scheduled Pt notified 

## 2016-07-15 ENCOUNTER — Ambulatory Visit (INDEPENDENT_AMBULATORY_CARE_PROVIDER_SITE_OTHER): Payer: Medicare HMO | Admitting: Family Medicine

## 2016-07-15 ENCOUNTER — Encounter: Payer: Self-pay | Admitting: Family Medicine

## 2016-07-15 VITALS — BP 137/80 | HR 83 | Temp 97.4°F | Ht 65.75 in | Wt 224.2 lb

## 2016-07-15 DIAGNOSIS — I10 Essential (primary) hypertension: Secondary | ICD-10-CM

## 2016-07-15 DIAGNOSIS — G5690 Unspecified mononeuropathy of unspecified upper limb: Secondary | ICD-10-CM

## 2016-07-15 DIAGNOSIS — M545 Low back pain, unspecified: Secondary | ICD-10-CM

## 2016-07-15 DIAGNOSIS — R7309 Other abnormal glucose: Secondary | ICD-10-CM

## 2016-07-15 DIAGNOSIS — M792 Neuralgia and neuritis, unspecified: Secondary | ICD-10-CM

## 2016-07-15 MED ORDER — PREGABALIN 75 MG PO CAPS: 75.0000 mg | ORAL_CAPSULE | Freq: Two times a day (BID) | ORAL | 2 refills | Status: DC

## 2016-07-15 MED ORDER — PREDNISONE 20 MG PO TABS: ORAL_TABLET | ORAL | 0 refills | Status: DC

## 2016-07-15 MED ORDER — HYDROCHLOROTHIAZIDE 25 MG PO TABS: 25.0000 mg | ORAL_TABLET | Freq: Every day | ORAL | 3 refills | Status: DC

## 2016-07-15 NOTE — Progress Notes (Signed)
   HPI  Patient presents today here for back and hand pain as well as hypertension.  Patient has started HCTZ with no improvement of the hand tightness, blood pressure still elevated. No chest pain, dyspnea, palpitations.  Low back pain, midline radiating to bilateral paraspinal muscles. Worsened with activity. Not helped by gabapentin, gabapentin causing months.  Bilateral hand pain described as tightness and tingling with burning type pain. No significant neck pain. No injury to the hands or back.  Family would like to see a specialist  PMH: Smoking status noted ROS: Per HPI  Objective: BP (!) 160/87   Pulse 81   Temp 97.4 F (36.3 C) (Oral)   Ht 5' 5.75" (1.67 m)   Wt 224 lb 3.2 oz (101.7 kg)   BMI 36.46 kg/m  Gen: NAD, alert, cooperative with exam HEENT: NCAT CV: RRR, good S1/S2, no murmur Resp: CTABL, no wheezes, non-labored Ext: No edema, warm Neuro: Alert and oriented, No gross deficits  Msk Tenderness to palpation of the midline lumbar spine, tenderness also in the paraspinal muscles.  Assessment and plan:  # Hypertension New diagnosis, titrate HCTZ to 25 mg.   # Neuropathic hand pain Bilateral, unclear etiology Burning and tingling type pain. Change from gabapentin to Lyrica, may need prior authorization. With tightness type pain and bleeding diuretic may also be helpful rheumatoid factor and sedimentation rate were normal last visit   # Low back pain Musculoskeletal back pain, previous x-ray from last visit shows diffuse degenerative changes, no acute fracture or changes. Short course of prednisone given. Trial of Lyrica Referral to orthopedics for their opinion and evaluation    Orders Placed This Encounter  Procedures  . Ambulatory referral to Orthopedic Surgery    Referral Priority:   Routine    Referral Type:   Surgical    Referral Reason:   Specialty Services Required    Requested Specialty:   Orthopedic Surgery    Number of Visits  Requested:   1    Meds ordered this encounter  Medications  . predniSONE (DELTASONE) 20 MG tablet    Sig: 2 po at sametime daily for 7 days    Dispense:  14 tablet    Refill:  0  . pregabalin (LYRICA) 75 MG capsule    Sig: Take 1 capsule (75 mg total) by mouth 2 (two) times daily.    Dispense:  60 capsule    Refill:  2  . hydrochlorothiazide (HYDRODIURIL) 25 MG tablet    Sig: Take 1 tablet (25 mg total) by mouth daily.    Dispense:  90 tablet    Refill:  [place], MD [facility] 07/15/2016, 8:23 AM

## 2016-07-15 NOTE — Patient Instructions (Signed)
Great to see you!  Stop gabapentin, try lyrica instead.   Use prednisone 2 tabs once daily for 1 week.   I have increased HCTZ to 25 mg.   We will call with lab results  We will work on a referral to orthopedic surgery, you will hear from [place] in about 1 week.

## 2016-07-16 LAB — BMP8+EGFR
BUN/Creatinine Ratio: 9 — ABNORMAL LOW (ref 10–24)
BUN: 9 mg/dL (ref 8–27)
CHLORIDE: 98 mmol/L (ref 96–106)
CO2: 24 mmol/L (ref 18–29)
Calcium: 9.6 mg/dL (ref 8.6–10.2)
Creatinine, Ser: 1.05 mg/dL (ref 0.76–1.27)
GFR calc Af Amer: 82 mL/min/{1.73_m2} (ref >59)
GFR calc non Af Amer: 71 mL/min/{1.73_m2} (ref >59)
Glucose: 133 mg/dL — ABNORMAL HIGH (ref 65–99)
POTASSIUM: 4.2 mmol/L (ref 3.5–5.2)
Sodium: 139 mmol/L (ref 134–144)

## 2016-07-19 DIAGNOSIS — R7309 Other abnormal glucose: Secondary | ICD-10-CM | POA: Diagnosis not present

## 2016-07-19 LAB — BAYER DCA HB A1C WAIVED: HB A1C (BAYER DCA - WAIVED): 6.1 % (ref <7.0)

## 2016-07-19 NOTE — Addendum Note (Signed)
Addended by: Nigel Berthold C on: 07/19/2016 11:41 AM   Modules accepted: Orders

## 2016-07-27 DIAGNOSIS — M5136 Other intervertebral disc degeneration, lumbar region: Secondary | ICD-10-CM | POA: Diagnosis not present

## 2016-07-27 DIAGNOSIS — M545 Low back pain: Secondary | ICD-10-CM | POA: Diagnosis not present

## 2016-08-10 DIAGNOSIS — M5136 Other intervertebral disc degeneration, lumbar region: Secondary | ICD-10-CM | POA: Diagnosis not present

## 2016-08-16 DIAGNOSIS — G8929 Other chronic pain: Secondary | ICD-10-CM | POA: Diagnosis not present

## 2016-08-16 DIAGNOSIS — M4316 Spondylolisthesis, lumbar region: Secondary | ICD-10-CM | POA: Diagnosis not present

## 2016-08-16 DIAGNOSIS — M5136 Other intervertebral disc degeneration, lumbar region: Secondary | ICD-10-CM | POA: Diagnosis not present

## 2016-08-16 DIAGNOSIS — M545 Low back pain: Secondary | ICD-10-CM | POA: Diagnosis not present

## 2016-08-18 ENCOUNTER — Telehealth: Payer: Self-pay | Admitting: Family Medicine

## 2016-08-18 MED ORDER — ATORVASTATIN CALCIUM 20 MG PO TABS: 20.0000 mg | ORAL_TABLET | Freq: Every day | ORAL | 2 refills | Status: DC

## 2016-08-18 NOTE — Telephone Encounter (Signed)
What is the name of the medication? Generic lipitor  Have you contacted your pharmacy to request a refill? Yes. But needs to go to [facility]. New rx with them.   Which pharmacy would you like this sent to? [facility].   Patient notified that their request is being sent to the clinical staff for review and that they should receive a call once it is complete. If they do not receive a call within 24 hours they can check with their pharmacy or our office.

## 2016-08-18 NOTE — Telephone Encounter (Signed)
done

## 2016-08-19 ENCOUNTER — Other Ambulatory Visit: Payer: Self-pay | Admitting: *Deleted

## 2016-08-23 DIAGNOSIS — G5602 Carpal tunnel syndrome, left upper limb: Secondary | ICD-10-CM | POA: Diagnosis not present

## 2016-08-23 DIAGNOSIS — G5601 Carpal tunnel syndrome, right upper limb: Secondary | ICD-10-CM | POA: Diagnosis not present

## 2016-08-23 DIAGNOSIS — G5603 Carpal tunnel syndrome, bilateral upper limbs: Secondary | ICD-10-CM | POA: Diagnosis not present

## 2016-08-26 ENCOUNTER — Ambulatory Visit: Payer: Self-pay | Admitting: Physician Assistant

## 2016-08-27 DIAGNOSIS — G5602 Carpal tunnel syndrome, left upper limb: Secondary | ICD-10-CM | POA: Diagnosis not present

## 2016-08-27 DIAGNOSIS — G5603 Carpal tunnel syndrome, bilateral upper limbs: Secondary | ICD-10-CM | POA: Diagnosis not present

## 2016-08-27 DIAGNOSIS — G5601 Carpal tunnel syndrome, right upper limb: Secondary | ICD-10-CM | POA: Diagnosis not present

## 2016-08-30 ENCOUNTER — Encounter: Payer: Self-pay | Admitting: Family Medicine

## 2016-08-30 ENCOUNTER — Ambulatory Visit (INDEPENDENT_AMBULATORY_CARE_PROVIDER_SITE_OTHER): Payer: Medicare HMO

## 2016-08-30 ENCOUNTER — Ambulatory Visit (INDEPENDENT_AMBULATORY_CARE_PROVIDER_SITE_OTHER): Payer: Medicare HMO | Admitting: Family Medicine

## 2016-08-30 VITALS — BP 136/81 | HR 77 | Temp 97.4°F | Ht 65.75 in | Wt 217.4 lb

## 2016-08-30 DIAGNOSIS — Z720 Tobacco use: Secondary | ICD-10-CM

## 2016-08-30 DIAGNOSIS — R05 Cough: Secondary | ICD-10-CM | POA: Diagnosis not present

## 2016-08-30 DIAGNOSIS — R059 Cough, unspecified: Secondary | ICD-10-CM

## 2016-08-30 DIAGNOSIS — I2510 Atherosclerotic heart disease of native coronary artery without angina pectoris: Secondary | ICD-10-CM

## 2016-08-30 DIAGNOSIS — Z01818 Encounter for other preprocedural examination: Secondary | ICD-10-CM | POA: Diagnosis not present

## 2016-08-30 NOTE — Patient Instructions (Signed)
Great to see you!  We will call with xray results  We are working on a referral to cardiology, you may also call for an appointment

## 2016-08-30 NOTE — Progress Notes (Signed)
   HPI  Patient presents today here for preoperative clearance.  Patient has recent diagnosis of carpal tunnel syndrome, he is being scheduled for lumbar decompression. He has a history of coronary artery disease with stenting 2. He denies chest pain or unusual shortness of breath with walking to the mailbox or climbing a single flight of stairs.  Patient has not been seen by his cardiologist in several years.  He had a slight cough and congestion lately. He states tonight and cough. He denies shortness of breath, fever, chills. He is tolerating food and fluids like usual   PMH: Smoking status noted ROS: Per HPI  Objective: BP 136/81   Pulse 77   Temp 97.4 F (36.3 C) (Oral)   Ht 5' 5.75" (1.67 m)   Wt 217 lb 6.4 oz (98.6 kg)   BMI 35.36 kg/m  Gen: NAD, alert, cooperative with exam HEENT: NCAT, EOMI, PERRL CV: RRR, good S1/S2, no murmur Resp: Nonlabored Ext: No edema, warm Neuro: Alert and oriented, No gross deficits  Assessment and plan:  # Reoperative clearance Given history of CAD without recent cardiology follow-up up recommended seeing a cardiologist to discuss preop evaluation. Medically he is clear, evaluating cough today but likely only seasonal allergies  # Tobacco abuse Recommended cessation, he is not ready  # Cough Likely seasonal allergies/postnasal drip Given smoking evaluated chest x-ray which is pending.  # CAD Pt is due for repeat lipids, previously controlled Continue statin Asymptomatic Refer to cardiology   Orders Placed This Encounter  Procedures  . DG Chest 2 View    Standing Status:   Future    Number of Occurrences:   1    Standing Expiration Date:   10/30/2017    Order Specific Question:   Reason for Exam (SYMPTOM  OR DIAGNOSIS REQUIRED)    Answer:   cough    Order Specific Question:   Preferred imaging location?    Answer:   Internal    Order Specific Question:   Radiology Contrast Protocol - do NOT remove file path    Answer:    \\[email or web address]  . Ambulatory referral to Cardiology    Referral Priority:   Routine    Referral Type:   Consultation    Referral Reason:   Specialty Services Required    Requested Specialty:   Cardiology    Number of Visits Requested:   [place], MD [facility] 08/30/2016, 3:54 PM

## 2016-08-31 ENCOUNTER — Telehealth: Payer: Self-pay | Admitting: Family Medicine

## 2016-08-31 NOTE — Telephone Encounter (Signed)
Wife talked with Dr. Kennon Holter office They stated he needed EKG, labs & note from Dr. Wendi Snipes sent

## 2016-08-31 NOTE — Telephone Encounter (Signed)
Will discuss with referral coordinator  Laroy Apple, MD [facility] 08/31/2016, 12:47 PM

## 2016-09-06 ENCOUNTER — Ambulatory Visit: Payer: Self-pay | Admitting: Cardiology

## 2016-09-06 NOTE — Pre-Procedure Instructions (Signed)
LA DIBELLA  09/06/2016      [facility] [address] [place] [address] [place] [address] Phone: [phone] Fax: [phone]  [place], [place] [address] Phone: [phone] Fax: [phone]    Your procedure is scheduled on Wed. June 20  Report to [facility] Admitting at 12:00 P.M.  Call this number if you have problems the morning of surgery:  [phone]   Remember:  Do not eat food or drink liquids after midnight on Tues. June 19   Take these medicines the morning of surgery with A SIP OF WATER :lyrica,tramadol if needed               1 week prior to surgery stop:advil,motrin, aleve, BC Powders, Goody's, vitamins/herbal medicines               Stop aspirin per Dr. Rolena Infante   Do not wear jewelry.  Do not wear lotions, powders, or perfumes, or deoderant.  Do not shave 48 hours prior to surgery.  Men may shave face and neck.  Do not bring valuables to the hospital.  [facility] is not responsible for any belongings or valuables.  Contacts, dentures or bridgework may not be worn into surgery.  Leave your suitcase in the car.  After surgery it may be brought to your room.  For patients admitted to the hospital, discharge time will be determined by your treatment team.  Patients discharged the day of surgery will not be allowed to drive home.   Special instructions:  [facility]- Preparing For Surgery  Before surgery, you can play an important role. Because skin is not sterile, your skin needs to be as free of germs as possible. You can reduce the number of germs on your skin by washing with CHG (chlorahexidine gluconate) Soap before surgery.  CHG is an antiseptic cleaner which kills germs and bonds with the skin to continue killing germs even after washing.  Please do not use if you have an allergy to CHG or antibacterial soaps. If your skin  becomes reddened/irritated stop using the CHG.  Do not shave (including legs and underarms) for at least 48 hours prior to first CHG shower. It is OK to shave your face.  Please follow these instructions carefully.   1. Shower the NIGHT BEFORE SURGERY and the MORNING OF SURGERY with CHG.   2. If you chose to wash your hair, wash your hair first as usual with your normal shampoo.  3. After you shampoo, rinse your hair and body thoroughly to remove the shampoo.  4. Use CHG as you would any other liquid soap. You can apply CHG directly to the skin and wash gently with a scrungie or a clean washcloth.   5. Apply the CHG Soap to your body ONLY FROM THE NECK DOWN.  Do not use on open wounds or open sores. Avoid contact with your eyes, ears, mouth and genitals (private parts). Wash genitals (private parts) with your normal soap.  6. Wash thoroughly, paying special attention to the area where your surgery will be performed.  7. Thoroughly rinse your body with warm water from the neck down.  8. DO NOT shower/wash with your normal soap after using and rinsing off the CHG Soap.  9. Pat yourself dry with a CLEAN TOWEL.   10. Wear CLEAN PAJAMAS   11. Place CLEAN  SHEETS on your bed the night of your first shower and DO NOT SLEEP WITH PETS.    Day of Surgery: Do not apply any deodorants/lotions. Please wear clean clothes to the hospital/surgery center.      Please read over the following fact sheets that you were given. Coughing and Deep Breathing, MRSA Information and Surgical Site Infection Prevention

## 2016-09-06 NOTE — Telephone Encounter (Signed)
Appointment made and pt aware

## 2016-09-07 ENCOUNTER — Encounter: Payer: Self-pay | Admitting: Cardiovascular Disease

## 2016-09-07 ENCOUNTER — Encounter (HOSPITAL_COMMUNITY): Payer: Self-pay

## 2016-09-07 ENCOUNTER — Encounter (HOSPITAL_COMMUNITY)
Admission: RE | Admit: 2016-09-07 | Discharge: 2016-09-07 | Disposition: A | Discharge status: Home / Self Care | Payer: Medicare HMO | Source: Ambulatory Visit | Attending: Orthopedic Surgery | Admitting: Orthopedic Surgery

## 2016-09-07 ENCOUNTER — Ambulatory Visit (INDEPENDENT_AMBULATORY_CARE_PROVIDER_SITE_OTHER): Payer: Medicare HMO | Admitting: Cardiovascular Disease

## 2016-09-07 VITALS — BP 114/70 | HR 86 | Ht 67.0 in | Wt 219.2 lb

## 2016-09-07 DIAGNOSIS — E669 Obesity, unspecified: Secondary | ICD-10-CM | POA: Diagnosis not present

## 2016-09-07 DIAGNOSIS — I10 Essential (primary) hypertension: Secondary | ICD-10-CM | POA: Diagnosis not present

## 2016-09-07 DIAGNOSIS — I2510 Atherosclerotic heart disease of native coronary artery without angina pectoris: Secondary | ICD-10-CM

## 2016-09-07 DIAGNOSIS — I4510 Unspecified right bundle-branch block: Secondary | ICD-10-CM | POA: Diagnosis not present

## 2016-09-07 DIAGNOSIS — Z01810 Encounter for preprocedural cardiovascular examination: Secondary | ICD-10-CM | POA: Diagnosis not present

## 2016-09-07 DIAGNOSIS — Z720 Tobacco use: Secondary | ICD-10-CM

## 2016-09-07 DIAGNOSIS — Z01818 Encounter for other preprocedural examination: Secondary | ICD-10-CM | POA: Diagnosis not present

## 2016-09-07 DIAGNOSIS — E7800 Pure hypercholesterolemia, unspecified: Secondary | ICD-10-CM

## 2016-09-07 DIAGNOSIS — Z6834 Body mass index (BMI) 34.0-34.9, adult: Secondary | ICD-10-CM | POA: Diagnosis not present

## 2016-09-07 DIAGNOSIS — I252 Old myocardial infarction: Secondary | ICD-10-CM | POA: Diagnosis not present

## 2016-09-07 DIAGNOSIS — F17200 Nicotine dependence, unspecified, uncomplicated: Secondary | ICD-10-CM | POA: Diagnosis not present

## 2016-09-07 DIAGNOSIS — Z8249 Family history of ischemic heart disease and other diseases of the circulatory system: Secondary | ICD-10-CM | POA: Diagnosis not present

## 2016-09-07 DIAGNOSIS — Z01812 Encounter for preprocedural laboratory examination: Secondary | ICD-10-CM | POA: Diagnosis not present

## 2016-09-07 DIAGNOSIS — R9431 Abnormal electrocardiogram [ECG] [EKG]: Secondary | ICD-10-CM | POA: Diagnosis not present

## 2016-09-07 HISTORY — DX: Adverse effect of unspecified anesthetic, initial encounter: T41.45XA

## 2016-09-07 HISTORY — DX: Headache: R51

## 2016-09-07 HISTORY — DX: Dyspnea, unspecified: R06.00

## 2016-09-07 HISTORY — DX: Otitis media, unspecified, unspecified ear: H66.90

## 2016-09-07 HISTORY — DX: Unspecified osteoarthritis, unspecified site: M19.90

## 2016-09-07 HISTORY — DX: Cerebral infarction, unspecified: I63.9

## 2016-09-07 HISTORY — DX: Headache, unspecified: R51.9

## 2016-09-07 LAB — SURGICAL PCR SCREEN
MRSA, PCR: NEGATIVE
Staphylococcus aureus: NEGATIVE

## 2016-09-07 LAB — BASIC METABOLIC PANEL
Anion gap: 10 (ref 5–15)
BUN: 7 mg/dL (ref 6–20)
CO2: 26 mmol/L (ref 22–32)
CREATININE: 1.07 mg/dL (ref 0.61–1.24)
Calcium: 9.8 mg/dL (ref 8.9–10.3)
Chloride: 103 mmol/L (ref 101–111)
GFR calc Af Amer: >60 mL/min (ref >60)
GLUCOSE: 105 mg/dL — AB (ref 65–99)
Potassium: 3.7 mmol/L (ref 3.5–5.1)
SODIUM: 139 mmol/L (ref 135–145)

## 2016-09-07 LAB — TYPE AND SCREEN
ABO/RH(D): B NEG
Antibody Screen: NEGATIVE

## 2016-09-07 LAB — CBC
HEMATOCRIT: 48.8 % (ref 39.0–52.0)
Hemoglobin: 16.6 g/dL (ref 13.0–17.0)
MCH: 31 pg (ref 26.0–34.0)
MCHC: 34 g/dL (ref 30.0–36.0)
MCV: 91 fL (ref 78.0–100.0)
PLATELETS: 186 10*3/uL (ref 150–400)
RBC: 5.36 MIL/uL (ref 4.22–5.81)
RDW: 12.4 % (ref 11.5–15.5)
WBC: 10.4 10*3/uL (ref 4.0–10.5)

## 2016-09-07 LAB — ABO/RH: ABO/RH(D): B NEG

## 2016-09-07 NOTE — Assessment & Plan Note (Signed)
History of hyperlipidemia on statin therapy followed by his PCP 

## 2016-09-07 NOTE — Assessment & Plan Note (Signed)
History of continued tobacco abuse of one third pack per day which he has been doing for 55 years recalcitrant to risk factor modification.

## 2016-09-07 NOTE — Assessment & Plan Note (Signed)
History of essential hypertension blood pressure measures A1 14/70. He is on hydrochlorothiazide. Continue current meds at current dosing

## 2016-09-07 NOTE — Addendum Note (Signed)
Addended by: Zebedee Iba on: 09/07/2016 02:57 PM   Modules accepted: Orders

## 2016-09-07 NOTE — Assessment & Plan Note (Signed)
History of CAD status post stenting back in 2003. He's had no issues since. He apparently needs back surgery of L23 and 4. He does have risk factors including hypertension hyperlipidemia and family history. I am going to get a thrombotic Myoview stress test to risk stratify him.

## 2016-09-07 NOTE — Progress Notes (Signed)
09/07/2016 Daniel Short   04-09-1945  [id number]  Primary Physician Daniel Euler, MD Primary Cardiologist: Daniel Harp MD Daniel Short  HPI:  Daniel Short is a delightful 71 year old mildly overweight married Caucasian male father of 2, grandfather of 4 grandchildren accompanied by his wife Daniel Short  Today. 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 He apparently is scheduled to undergo extensive back surgery on 09/14/16 by Dr. Rolena Short of L34 and 5. His cardiac risk factors include continued tobacco abuse smoking one third pack per day for last 55 years, treated hypertension hyperlipidemia. He did have stents back in 2003 and has a strong family history for heart disease. He does have dyspnea but denies chest pain.   Current Outpatient Prescriptions  Medication Sig Dispense Refill  . aspirin 325 MG tablet Take 325 mg by mouth daily.    Marland Kitchen atorvastatin (LIPITOR) 20 MG tablet Take 1 tablet (20 mg total) by mouth daily. 30 tablet 2  . hydrochlorothiazide (HYDRODIURIL) 25 MG tablet Take 1 tablet (25 mg total) by mouth daily. 90 tablet 3  . pregabalin (LYRICA) 75 MG capsule Take 1 capsule (75 mg total) by mouth 2 (two) times daily. 60 capsule 2  . traMADol (ULTRAM) 50 MG tablet Take 50 mg by mouth every 8 (eight) hours as needed for moderate pain.     No current facility-administered medications for this visit.     No Known Allergies  Social History   Social History  . Marital status: Married    Spouse name: N/A  . Number of children: N/A  . Years of education: N/A   Occupational History  . Not on file.   Social History Main Topics  . Smoking status: Current Every Day Smoker    Packs/day: 0.25    Years: 54.00    Types: Cigarettes  . Smokeless tobacco: Never Used  . Alcohol use No  . Drug use: No  . Sexual activity: Not on file   Other Topics Concern  . Not on file   Social History Narrative  . No narrative on file     Review of  Systems: General: negative for chills, fever, night sweats or weight changes.  Cardiovascular: negative for chest pain, dyspnea on exertion, edema, orthopnea, palpitations, paroxysmal nocturnal dyspnea or shortness of breath Dermatological: negative for rash Respiratory: negative for cough or wheezing Urologic: negative for hematuria Abdominal: negative for nausea, vomiting, diarrhea, bright red blood per rectum, melena, or hematemesis Neurologic: negative for visual changes, syncope, or dizziness All other systems reviewed and are otherwise negative except as noted above.    Blood pressure 114/70, pulse 86, height 5\' 7"  (1.702 m), weight 219 lb 3.2 oz (99.4 kg).  General appearance: alert and no distress Neck: no adenopathy, no carotid bruit, no JVD, supple, symmetrical, trachea midline and thyroid not enlarged, symmetric, no tenderness/mass/nodules Lungs: clear to auscultation bilaterally Heart: regular rate and rhythm, S1, S2 normal, no murmur, click, rub or gallop Extremities: extremities normal, atraumatic, no cyanosis or edema  EKG sinus rhythm 86 with incomplete right bundle branch block and occasional PVCs. I personally reviewed this EKG.  ASSESSMENT AND PLAN:   CAD (coronary artery disease), native coronary artery History of CAD status post stenting back in 2003. He's had no issues since. He apparently needs back surgery of L23 and 4. He does have risk factors including hypertension hyperlipidemia and family history. I am going to get a thrombotic Myoview stress  test to risk stratify him.  HLD (hyperlipidemia) History of hyperlipidemia on statin therapy followed by his PCP  Tobacco abuse History of continued tobacco abuse of one third pack per day which he has been doing for 55 years recalcitrant to risk factor modification.  HTN (hypertension) History of essential hypertension blood pressure measures A1 14/70. He is on hydrochlorothiazide. Continue current meds at current  dosing      Daniel Harp MD [facility], [facility] 09/07/2016 2:48 PM

## 2016-09-07 NOTE — Progress Notes (Addendum)
PCP: Dr. Wendi Snipes @ [facility]  Pt. Stating he is seeing Dr. Gwenlyn Found today for cardiac clearance. Pt. Reported Dr. Rolena Infante told him to stop aspirin today (09/07/16)

## 2016-09-07 NOTE — Patient Instructions (Signed)
Medication Instructions: Your physician recommends that you continue on your current medications as directed. Please refer to the Current Medication list given to you today.   Testing/Procedures: Your physician has requested that you have a lexiscan myoview. For further information please visit [email or web address]. Please follow instruction sheet, as given.  Follow-Up: Your physician recommends that you schedule a follow-up appointment as needed with Dr. Berry.    

## 2016-09-08 ENCOUNTER — Telehealth (HOSPITAL_COMMUNITY): Payer: Self-pay

## 2016-09-08 ENCOUNTER — Telehealth: Payer: Self-pay | Admitting: *Deleted

## 2016-09-08 NOTE — Telephone Encounter (Signed)
Encounter complete. 

## 2016-09-08 NOTE — Telephone Encounter (Signed)
Requesting surgical clearance:  1. Type of surgery: Spine decomplression L3-5, insitu fusion L4-5, central decompression laminectomy- (spinal stenosis) each add segments cervical, thoracic or lumbar, fusion/arthrodesis posterior lumbar fusion, instrumentation posterior non-segmental, graft allograft spine surgery morselized.  2. Surgeon: Dr. Melina Schools  3.Surgical Date:09/14/16  4. Medications that need to be held: None   5. CAD: Yes  6. I will defer to:  Dr. Pearla Dubonnet Information: Antionette Char Fax # [phone] attention Derl Barrow   OV 6/13-stress test 6/15 to determine clearance.

## 2016-09-08 NOTE — Progress Notes (Addendum)
Anesthesia chart review: Patient is a 71 year old scheduled for decompression L3-5 and in situ fusion L4-5 on 09/14/2016 by Dr. Rolena Infante.  History includes smoking, CAD/MI s/p stents '03 (there is a 02/15/02 encounter listed with Dr. Bing Quarry, but records are not otherwise viewable in Epic), hemorrhagic CVA ~ '00 [facility]), exertional dyspnea, arthritis, hernia repair. BMI is consistent with obesity.  - PCP is Dr. Kenn File at [facility]. He medically cleared patient, but recommended cardiology evaluation prior to surgery. - Cardiologist is Dr. Quay Burow, last visit 09/07/16 for preoperative clearance. A stress test was ordered and is being done 09/09/16.   Meds include aspirin 325 mg (on hold), Lipitor, HCTZ, Lyrica, tramadol.  BP (!) 141/73   Pulse 92   Temp 36.6 C   Resp 20   Ht 5\' 7"  (1.702 m)   Wt 217 lb 3.2 oz (98.5 kg)   SpO2 95%   BMI 34.02 kg/m   EKG 09/07/16 ([facility]): SR with occasional PVCs and PACs, LAD, incomplete right BBB, non-specific T wave abnormality.   Cardiac cath 01/2012 requested from [facility], as available.   CXR 08/30/16: IMPRESSION: Enlargement of cardiac silhouette. Hyperinflation without acute infiltrate  Preoperative labs noted. Cr 1.07. Glucose 105. CBC WNL. T&S done.   Chart will be left for follow-up stress test results and clearance status.  George Hugh [facility] Short Stay Center/Anesthesiology Phone [phone] 09/08/2016 1:08 PM  Addendum: 02/15/02 cath reports (Dr. Christy Sartorius) received. Patient initially had LHC that showed 30% mid left main. 30% ostial LAD with remainder of LAD with mild irregularities. Left circumflex with mild irregularities. RCA had ostial 70% and 30% mid stenosis. Distal branch vessels had mild irregularities. EF greater than 55%. Plan included consideration of medical therapy versus percutaneous intervention (given risk of anticoagulation with history of SAH). However,  following removal of arterial sheath patient developed vasovagal reaction and third-degree AV block with heart rate of 30 bpm associated with substernal chest discomfort, diaphoresis and change in mental status. ECG showed ST elevation in inferior leads consistent with inferior wall injury. Patient was taken back to the cath lab and underwent: placement of temporary RV pacemaker, PTCA/Zeta stent X 2 (proximal and ostial) RCA.  George Hugh [facility] Short Stay Center/Anesthesiology Phone [phone] 09/08/2016 5:21 PM  Addendum: Preoperative stress test was low risk.  Nuclear stress test 09/09/16:   The left ventricular ejection fraction is mildly decreased (45-54%).  Nuclear stress EF: 50%.  There was no ST segment deviation noted during stress.  Findings consistent with prior myocardial infarction.  This is a low risk study.  Small area of mid inferior wall infarct no ischemia EF estimated at 50% no discrete RWMA.  Dr. Gwenlyn Found cleared for surgery at low risk.  George Hugh [facility] Short Stay Center/Anesthesiology Phone [phone] 09/13/2016 1:32 PM

## 2016-09-09 ENCOUNTER — Encounter (HOSPITAL_COMMUNITY): Payer: Self-pay

## 2016-09-09 ENCOUNTER — Ambulatory Visit (HOSPITAL_COMMUNITY)
Admission: RE | Admit: 2016-09-09 | Discharge: 2016-09-09 | Disposition: A | Discharge status: Home / Self Care | Payer: Medicare HMO | Source: Ambulatory Visit | Attending: Cardiovascular Disease | Admitting: Cardiovascular Disease

## 2016-09-09 DIAGNOSIS — I2510 Atherosclerotic heart disease of native coronary artery without angina pectoris: Secondary | ICD-10-CM | POA: Insufficient documentation

## 2016-09-09 DIAGNOSIS — Z01818 Encounter for other preprocedural examination: Secondary | ICD-10-CM | POA: Insufficient documentation

## 2016-09-09 DIAGNOSIS — Z8249 Family history of ischemic heart disease and other diseases of the circulatory system: Secondary | ICD-10-CM | POA: Insufficient documentation

## 2016-09-09 DIAGNOSIS — I252 Old myocardial infarction: Secondary | ICD-10-CM | POA: Diagnosis not present

## 2016-09-09 DIAGNOSIS — F17200 Nicotine dependence, unspecified, uncomplicated: Secondary | ICD-10-CM | POA: Insufficient documentation

## 2016-09-09 DIAGNOSIS — I10 Essential (primary) hypertension: Secondary | ICD-10-CM | POA: Diagnosis not present

## 2016-09-09 DIAGNOSIS — R9431 Abnormal electrocardiogram [ECG] [EKG]: Secondary | ICD-10-CM | POA: Diagnosis not present

## 2016-09-09 DIAGNOSIS — Z01812 Encounter for preprocedural laboratory examination: Secondary | ICD-10-CM | POA: Insufficient documentation

## 2016-09-09 DIAGNOSIS — I4510 Unspecified right bundle-branch block: Secondary | ICD-10-CM | POA: Insufficient documentation

## 2016-09-09 DIAGNOSIS — E669 Obesity, unspecified: Secondary | ICD-10-CM | POA: Insufficient documentation

## 2016-09-09 DIAGNOSIS — Z01810 Encounter for preprocedural cardiovascular examination: Secondary | ICD-10-CM | POA: Diagnosis not present

## 2016-09-09 DIAGNOSIS — M48062 Spinal stenosis, lumbar region with neurogenic claudication: Secondary | ICD-10-CM | POA: Diagnosis not present

## 2016-09-09 DIAGNOSIS — Z6834 Body mass index (BMI) 34.0-34.9, adult: Secondary | ICD-10-CM | POA: Insufficient documentation

## 2016-09-09 LAB — MYOCARDIAL PERFUSION IMAGING
CHL CUP NUCLEAR SRS: 6
CHL CUP NUCLEAR SSS: 7
LV dias vol: 105 mL (ref 62–150)
LV sys vol: 52 mL
NUC STRESS TID: 1.18
Peak HR: 90 {beats}/min
Rest HR: 65 {beats}/min
SDS: 2

## 2016-09-09 MED ORDER — TECHNETIUM TC 99M TETROFOSMIN IV KIT
10.3000 | PACK | Freq: Once | INTRAVENOUS | Status: AC | PRN
  Administered 2016-09-09: 10.3 via INTRAVENOUS
  Filled 2016-09-09: qty 11

## 2016-09-09 MED ORDER — TECHNETIUM TC 99M TETROFOSMIN IV KIT
31.9000 | PACK | Freq: Once | INTRAVENOUS | Status: AC | PRN
  Administered 2016-09-09: 31.9 via INTRAVENOUS
  Filled 2016-09-09: qty 32

## 2016-09-09 MED ORDER — REGADENOSON 0.4 MG/5ML IV SOLN
0.4000 mg | Freq: Once | INTRAVENOUS | Status: AC
  Administered 2016-09-09: 0.4 mg via INTRAVENOUS

## 2016-09-12 NOTE — Telephone Encounter (Signed)
Follow up       Request for surgical clearance:  1. What type of surgery is being performed?  Back surgery  When is this surgery scheduled? 09-14-16 Are there any medications that need to be held prior to surgery and how long?  Need medical clearance 2. Name of physician performing surgery? Dr Rolena Infante What is your office phone and fax number?  Fax [phone]

## 2016-09-12 NOTE — Telephone Encounter (Signed)
F/U Call:  Patient calling states that he would like an update on the previous message. Thanks.

## 2016-09-12 NOTE — Telephone Encounter (Signed)
Told pt that I have sent this to Dr. Gwenlyn Found and will give them a call as soon as I hear back from him. Explained that he is in the hospital today, but will be in the office tomorrow and will ask him about it then.   If cleared, I will send clearance letter to number provided and give pt a call to let them know.  Pt verbalized appreciation and understanding.

## 2016-09-13 ENCOUNTER — Telehealth: Payer: Self-pay | Admitting: Cardiovascular Disease

## 2016-09-13 MED ORDER — CEFAZOLIN SODIUM-DEXTROSE 2-4 GM/100ML-% IV SOLN
2.0000 g | INTRAVENOUS | Status: AC
  Administered 2016-09-14 (×2): 2 g via INTRAVENOUS
  Filled 2016-09-13: qty 100

## 2016-09-13 NOTE — Telephone Encounter (Signed)
Spoke with the Dr. Kennon Holter assist. She stated that Dr. Gwenlyn Found stated he is a low risk for the surgery. The patient has been notified and verbalized understanding.

## 2016-09-13 NOTE — Telephone Encounter (Signed)
Clearance has been sent via Epic to Derl Barrow at [facility].

## 2016-09-13 NOTE — Telephone Encounter (Signed)
New message    Pt wife is calling to follow up about surgical clearance. Please call.

## 2016-09-13 NOTE — Addendum Note (Signed)
Addended by: Ricci Barker on: 09/13/2016 01:40 PM   Modules accepted: Level of Service, SmartSet

## 2016-09-13 NOTE — Telephone Encounter (Signed)
This encounter was created in error - please disregard.

## 2016-09-13 NOTE — Telephone Encounter (Signed)
New message    Would like a conversation with Dr Gwenlyn Found

## 2016-09-14 ENCOUNTER — Encounter (HOSPITAL_COMMUNITY): Payer: Self-pay | Admitting: *Deleted

## 2016-09-14 ENCOUNTER — Inpatient Hospital Stay (HOSPITAL_COMMUNITY): Payer: Medicare HMO | Admitting: Anesthesiology

## 2016-09-14 ENCOUNTER — Inpatient Hospital Stay (HOSPITAL_COMMUNITY): Payer: Medicare HMO

## 2016-09-14 ENCOUNTER — Observation Stay (HOSPITAL_COMMUNITY)
Admission: RE | Admit: 2016-09-14 | Discharge: 2016-09-16 | Disposition: A | Discharge status: Home / Self Care | Payer: Medicare HMO | Source: Ambulatory Visit | Attending: Orthopedic Surgery | Admitting: Orthopedic Surgery

## 2016-09-14 ENCOUNTER — Inpatient Hospital Stay (HOSPITAL_COMMUNITY): Payer: Medicare HMO | Admitting: Vascular Surgery

## 2016-09-14 ENCOUNTER — Encounter (HOSPITAL_COMMUNITY)
Admission: RE | Disposition: A | Discharge status: Home / Self Care | Payer: Self-pay | Source: Ambulatory Visit | Attending: Orthopedic Surgery

## 2016-09-14 DIAGNOSIS — I252 Old myocardial infarction: Secondary | ICD-10-CM | POA: Diagnosis not present

## 2016-09-14 DIAGNOSIS — Z8673 Personal history of transient ischemic attack (TIA), and cerebral infarction without residual deficits: Secondary | ICD-10-CM | POA: Diagnosis not present

## 2016-09-14 DIAGNOSIS — E669 Obesity, unspecified: Secondary | ICD-10-CM

## 2016-09-14 DIAGNOSIS — M4316 Spondylolisthesis, lumbar region: Secondary | ICD-10-CM | POA: Diagnosis present

## 2016-09-14 DIAGNOSIS — M4805 Spinal stenosis, thoracolumbar region: Secondary | ICD-10-CM | POA: Diagnosis not present

## 2016-09-14 DIAGNOSIS — M549 Dorsalgia, unspecified: Secondary | ICD-10-CM | POA: Diagnosis present

## 2016-09-14 DIAGNOSIS — Z8249 Family history of ischemic heart disease and other diseases of the circulatory system: Secondary | ICD-10-CM

## 2016-09-14 DIAGNOSIS — M5116 Intervertebral disc disorders with radiculopathy, lumbar region: Secondary | ICD-10-CM | POA: Diagnosis present

## 2016-09-14 DIAGNOSIS — Z7982 Long term (current) use of aspirin: Secondary | ICD-10-CM | POA: Diagnosis not present

## 2016-09-14 DIAGNOSIS — M47896 Other spondylosis, lumbar region: Secondary | ICD-10-CM | POA: Diagnosis not present

## 2016-09-14 DIAGNOSIS — G9763 Postprocedural seroma of a nervous system organ or structure following a nervous system procedure: Secondary | ICD-10-CM | POA: Diagnosis present

## 2016-09-14 DIAGNOSIS — Z79899 Other long term (current) drug therapy: Secondary | ICD-10-CM

## 2016-09-14 DIAGNOSIS — Z9889 Other specified postprocedural states: Secondary | ICD-10-CM | POA: Diagnosis not present

## 2016-09-14 DIAGNOSIS — M5126 Other intervertebral disc displacement, lumbar region: Secondary | ICD-10-CM | POA: Diagnosis not present

## 2016-09-14 DIAGNOSIS — Y839 Surgical procedure, unspecified as the cause of abnormal reaction of the patient, or of later complication, without mention of misadventure at the time of the procedure: Secondary | ICD-10-CM | POA: Diagnosis present

## 2016-09-14 DIAGNOSIS — F17200 Nicotine dependence, unspecified, uncomplicated: Secondary | ICD-10-CM

## 2016-09-14 DIAGNOSIS — G9761 Postprocedural hematoma of a nervous system organ or structure following a nervous system procedure: Secondary | ICD-10-CM | POA: Diagnosis present

## 2016-09-14 DIAGNOSIS — M4326 Fusion of spine, lumbar region: Secondary | ICD-10-CM | POA: Diagnosis not present

## 2016-09-14 DIAGNOSIS — E785 Hyperlipidemia, unspecified: Secondary | ICD-10-CM | POA: Diagnosis present

## 2016-09-14 DIAGNOSIS — G970 Cerebrospinal fluid leak from spinal puncture: Secondary | ICD-10-CM | POA: Diagnosis not present

## 2016-09-14 DIAGNOSIS — Z955 Presence of coronary angioplasty implant and graft: Secondary | ICD-10-CM | POA: Diagnosis not present

## 2016-09-14 DIAGNOSIS — M5489 Other dorsalgia: Secondary | ICD-10-CM | POA: Diagnosis not present

## 2016-09-14 DIAGNOSIS — M545 Low back pain: Secondary | ICD-10-CM | POA: Diagnosis not present

## 2016-09-14 DIAGNOSIS — M48062 Spinal stenosis, lumbar region with neurogenic claudication: Secondary | ICD-10-CM | POA: Diagnosis not present

## 2016-09-14 DIAGNOSIS — R404 Transient alteration of awareness: Secondary | ICD-10-CM | POA: Diagnosis not present

## 2016-09-14 DIAGNOSIS — Z836 Family history of other diseases of the respiratory system: Secondary | ICD-10-CM

## 2016-09-14 DIAGNOSIS — M96841 Postprocedural hematoma of a musculoskeletal structure following other procedure: Secondary | ICD-10-CM | POA: Diagnosis not present

## 2016-09-14 DIAGNOSIS — F17210 Nicotine dependence, cigarettes, uncomplicated: Secondary | ICD-10-CM | POA: Diagnosis present

## 2016-09-14 DIAGNOSIS — M25569 Pain in unspecified knee: Secondary | ICD-10-CM | POA: Diagnosis not present

## 2016-09-14 DIAGNOSIS — Z8679 Personal history of other diseases of the circulatory system: Secondary | ICD-10-CM

## 2016-09-14 DIAGNOSIS — I10 Essential (primary) hypertension: Secondary | ICD-10-CM | POA: Diagnosis not present

## 2016-09-14 DIAGNOSIS — R531 Weakness: Secondary | ICD-10-CM | POA: Diagnosis not present

## 2016-09-14 DIAGNOSIS — M1990 Unspecified osteoarthritis, unspecified site: Secondary | ICD-10-CM

## 2016-09-14 DIAGNOSIS — M48061 Spinal stenosis, lumbar region without neurogenic claudication: Secondary | ICD-10-CM

## 2016-09-14 DIAGNOSIS — I2510 Atherosclerotic heart disease of native coronary artery without angina pectoris: Secondary | ICD-10-CM

## 2016-09-14 DIAGNOSIS — Z419 Encounter for procedure for purposes other than remedying health state, unspecified: Secondary | ICD-10-CM

## 2016-09-14 DIAGNOSIS — Z6835 Body mass index (BMI) 35.0-35.9, adult: Secondary | ICD-10-CM

## 2016-09-14 DIAGNOSIS — M79605 Pain in left leg: Secondary | ICD-10-CM | POA: Diagnosis not present

## 2016-09-14 DIAGNOSIS — M109 Gout, unspecified: Secondary | ICD-10-CM | POA: Diagnosis present

## 2016-09-14 DIAGNOSIS — M25562 Pain in left knee: Secondary | ICD-10-CM | POA: Diagnosis not present

## 2016-09-14 HISTORY — PX: LUMBAR LAMINECTOMY/DECOMPRESSION MICRODISCECTOMY: SHX5026

## 2016-09-14 SURGERY — LUMBAR LAMINECTOMY/DECOMPRESSION MICRODISCECTOMY 2 LEVELS
Anesthesia: General

## 2016-09-14 MED ORDER — HYDROMORPHONE HCL 1 MG/ML IJ SOLN
INTRAMUSCULAR | Status: AC
  Filled 2016-09-14: qty 0.5

## 2016-09-14 MED ORDER — FENTANYL CITRATE (PF) 100 MCG/2ML IJ SOLN
INTRAMUSCULAR | Status: DC | PRN
  Administered 2016-09-14: 100 ug via INTRAVENOUS
  Administered 2016-09-14: 150 ug via INTRAVENOUS
  Administered 2016-09-14 (×2): 100 ug via INTRAVENOUS
  Administered 2016-09-14: 50 ug via INTRAVENOUS

## 2016-09-14 MED ORDER — HYDROMORPHONE HCL 1 MG/ML IJ SOLN
INTRAMUSCULAR | Status: DC | PRN
  Administered 2016-09-14: 1 mg via INTRAVENOUS

## 2016-09-14 MED ORDER — ARTIFICIAL TEARS OPHTHALMIC OINT
TOPICAL_OINTMENT | OPHTHALMIC | Status: AC
  Filled 2016-09-14: qty 3.5

## 2016-09-14 MED ORDER — PROMETHAZINE HCL 25 MG/ML IJ SOLN: 6.2500 mg | INTRAMUSCULAR | Status: DC | PRN

## 2016-09-14 MED ORDER — LIDOCAINE HCL (CARDIAC) 20 MG/ML IV SOLN
INTRAVENOUS | Status: DC | PRN
  Administered 2016-09-14: 80 mg via INTRAVENOUS

## 2016-09-14 MED ORDER — VECURONIUM BROMIDE 10 MG IV SOLR
INTRAVENOUS | Status: AC
  Filled 2016-09-14: qty 10

## 2016-09-14 MED ORDER — CEFAZOLIN SODIUM 1 G IJ SOLR
INTRAMUSCULAR | Status: AC
  Filled 2016-09-14: qty 20

## 2016-09-14 MED ORDER — BUPIVACAINE-EPINEPHRINE (PF) 0.25% -1:200000 IJ SOLN
INTRAMUSCULAR | Status: AC
  Filled 2016-09-14: qty 30

## 2016-09-14 MED ORDER — OXYCODONE HCL 5 MG PO TABS
10.0000 mg | ORAL_TABLET | ORAL | Status: DC | PRN
  Administered 2016-09-14 – 2016-09-16 (×5): 10 mg via ORAL
  Filled 2016-09-14 (×6): qty 2

## 2016-09-14 MED ORDER — BUPIVACAINE-EPINEPHRINE (PF) 0.25% -1:200000 IJ SOLN
INTRAMUSCULAR | Status: DC | PRN
  Administered 2016-09-14: 10 mL

## 2016-09-14 MED ORDER — VECURONIUM BROMIDE 10 MG IV SOLR
INTRAVENOUS | Status: DC | PRN
  Administered 2016-09-14: 4 mg via INTRAVENOUS

## 2016-09-14 MED ORDER — SUCCINYLCHOLINE CHLORIDE 20 MG/ML IJ SOLN
INTRAMUSCULAR | Status: DC | PRN
  Administered 2016-09-14: 120 mg via INTRAVENOUS

## 2016-09-14 MED ORDER — HYDROCHLOROTHIAZIDE 25 MG PO TABS
25.0000 mg | ORAL_TABLET | Freq: Every day | ORAL | Status: DC
  Administered 2016-09-15 – 2016-09-16 (×2): 25 mg via ORAL
  Filled 2016-09-14 (×2): qty 1

## 2016-09-14 MED ORDER — METHOCARBAMOL 500 MG PO TABS
500.0000 mg | ORAL_TABLET | Freq: Four times a day (QID) | ORAL | Status: DC | PRN
  Administered 2016-09-16: 500 mg via ORAL
  Filled 2016-09-14 (×2): qty 1

## 2016-09-14 MED ORDER — ONDANSETRON HCL 4 MG/2ML IJ SOLN
INTRAMUSCULAR | Status: AC
  Filled 2016-09-14: qty 2

## 2016-09-14 MED ORDER — POLYETHYLENE GLYCOL 3350 17 G PO PACK: 17.0000 g | PACK | Freq: Every day | ORAL | Status: DC | PRN

## 2016-09-14 MED ORDER — ONDANSETRON HCL 4 MG/2ML IJ SOLN: 4.0000 mg | Freq: Four times a day (QID) | INTRAMUSCULAR | Status: DC | PRN

## 2016-09-14 MED ORDER — MORPHINE SULFATE (PF) 2 MG/ML IV SOLN: 2.0000 mg | INTRAVENOUS | Status: AC | PRN

## 2016-09-14 MED ORDER — PROPOFOL 10 MG/ML IV BOLUS
INTRAVENOUS | Status: DC | PRN
  Administered 2016-09-14: 200 mg via INTRAVENOUS

## 2016-09-14 MED ORDER — MIDAZOLAM HCL 2 MG/2ML IJ SOLN
INTRAMUSCULAR | Status: AC
  Filled 2016-09-14: qty 2

## 2016-09-14 MED ORDER — 0.9 % SODIUM CHLORIDE (POUR BTL) OPTIME
TOPICAL | Status: DC | PRN
  Administered 2016-09-14: 1000 mL

## 2016-09-14 MED ORDER — ALBUTEROL SULFATE (2.5 MG/3ML) 0.083% IN NEBU
2.5000 mg | INHALATION_SOLUTION | Freq: Once | RESPIRATORY_TRACT | Status: AC
  Administered 2016-09-14: 2.5 mg via RESPIRATORY_TRACT

## 2016-09-14 MED ORDER — DEXAMETHASONE SODIUM PHOSPHATE 10 MG/ML IJ SOLN
INTRAMUSCULAR | Status: AC
  Filled 2016-09-14: qty 1

## 2016-09-14 MED ORDER — ONDANSETRON HCL 4 MG/2ML IJ SOLN
INTRAMUSCULAR | Status: DC | PRN
  Administered 2016-09-14: 4 mg via INTRAVENOUS

## 2016-09-14 MED ORDER — DEXAMETHASONE SODIUM PHOSPHATE 10 MG/ML IJ SOLN
INTRAMUSCULAR | Status: DC | PRN
  Administered 2016-09-14: 10 mg via INTRAVENOUS

## 2016-09-14 MED ORDER — THROMBIN 20000 UNITS EX SOLR
CUTANEOUS | Status: AC
  Filled 2016-09-14: qty 20000

## 2016-09-14 MED ORDER — ROCURONIUM BROMIDE 10 MG/ML (PF) SYRINGE
PREFILLED_SYRINGE | INTRAVENOUS | Status: AC
  Filled 2016-09-14: qty 5

## 2016-09-14 MED ORDER — DEXAMETHASONE SODIUM PHOSPHATE 4 MG/ML IJ SOLN: 4.0000 mg | Freq: Four times a day (QID) | INTRAMUSCULAR | Status: AC

## 2016-09-14 MED ORDER — FENTANYL CITRATE (PF) 250 MCG/5ML IJ SOLN
INTRAMUSCULAR | Status: AC
  Filled 2016-09-14: qty 10

## 2016-09-14 MED ORDER — SODIUM CHLORIDE 0.9 % IV SOLN: 250.0000 mL | INTRAVENOUS | Status: DC

## 2016-09-14 MED ORDER — CEFAZOLIN SODIUM-DEXTROSE 2-4 GM/100ML-% IV SOLN
2.0000 g | Freq: Three times a day (TID) | INTRAVENOUS | Status: AC
  Administered 2016-09-14 – 2016-09-15 (×2): 2 g via INTRAVENOUS
  Filled 2016-09-14 (×3): qty 100

## 2016-09-14 MED ORDER — DEXAMETHASONE 4 MG PO TABS
4.0000 mg | ORAL_TABLET | Freq: Four times a day (QID) | ORAL | Status: AC
  Administered 2016-09-14 – 2016-09-15 (×3): 4 mg via ORAL
  Filled 2016-09-14 (×3): qty 1

## 2016-09-14 MED ORDER — DEXTROSE 5 % IV SOLN
INTRAVENOUS | Status: DC | PRN
  Administered 2016-09-14: 15:00:00 via INTRAVENOUS

## 2016-09-14 MED ORDER — ACETAMINOPHEN 10 MG/ML IV SOLN
INTRAVENOUS | Status: AC
  Filled 2016-09-14: qty 100

## 2016-09-14 MED ORDER — PROPOFOL 10 MG/ML IV BOLUS
INTRAVENOUS | Status: AC
  Filled 2016-09-14: qty 20

## 2016-09-14 MED ORDER — METHOCARBAMOL 1000 MG/10ML IJ SOLN
500.0000 mg | Freq: Four times a day (QID) | INTRAVENOUS | Status: DC | PRN
  Filled 2016-09-14: qty 5

## 2016-09-14 MED ORDER — ARTIFICIAL TEARS OPHTHALMIC OINT
TOPICAL_OINTMENT | OPHTHALMIC | Status: DC | PRN
  Administered 2016-09-14: 1 via OPHTHALMIC

## 2016-09-14 MED ORDER — ALBUMIN HUMAN 5 % IV SOLN
INTRAVENOUS | Status: DC | PRN
  Administered 2016-09-14 (×2): via INTRAVENOUS

## 2016-09-14 MED ORDER — SUGAMMADEX SODIUM 200 MG/2ML IV SOLN
INTRAVENOUS | Status: DC | PRN
  Administered 2016-09-14: 200 mg via INTRAVENOUS

## 2016-09-14 MED ORDER — LACTATED RINGERS IV SOLN
INTRAVENOUS | Status: DC
  Administered 2016-09-14: 23:00:00 via INTRAVENOUS

## 2016-09-14 MED ORDER — ROCURONIUM BROMIDE 100 MG/10ML IV SOLN
INTRAVENOUS | Status: DC | PRN
  Administered 2016-09-14: 100 mg via INTRAVENOUS

## 2016-09-14 MED ORDER — ACETAMINOPHEN 10 MG/ML IV SOLN
INTRAVENOUS | Status: DC | PRN
  Administered 2016-09-14: 1000 mg via INTRAVENOUS

## 2016-09-14 MED ORDER — MENTHOL 3 MG MT LOZG: 1.0000 | LOZENGE | OROMUCOSAL | Status: DC | PRN

## 2016-09-14 MED ORDER — EPHEDRINE SULFATE 50 MG/ML IJ SOLN
INTRAMUSCULAR | Status: DC | PRN
  Administered 2016-09-14: 15 mg via INTRAVENOUS

## 2016-09-14 MED ORDER — ACETAMINOPHEN 325 MG PO TABS: 650.0000 mg | ORAL_TABLET | ORAL | Status: DC | PRN

## 2016-09-14 MED ORDER — EPHEDRINE 5 MG/ML INJ
INTRAVENOUS | Status: AC
  Filled 2016-09-14: qty 10

## 2016-09-14 MED ORDER — SODIUM CHLORIDE 0.9% FLUSH
3.0000 mL | Freq: Two times a day (BID) | INTRAVENOUS | Status: DC
  Administered 2016-09-14 – 2016-09-16 (×3): 3 mL via INTRAVENOUS

## 2016-09-14 MED ORDER — LIDOCAINE 2% (20 MG/ML) 5 ML SYRINGE
INTRAMUSCULAR | Status: AC
  Filled 2016-09-14: qty 5

## 2016-09-14 MED ORDER — SUCCINYLCHOLINE CHLORIDE 200 MG/10ML IV SOSY
PREFILLED_SYRINGE | INTRAVENOUS | Status: AC
  Filled 2016-09-14: qty 20

## 2016-09-14 MED ORDER — PHENOL 1.4 % MT LIQD: 1.0000 | OROMUCOSAL | Status: DC | PRN

## 2016-09-14 MED ORDER — HEMOSTATIC AGENTS (NO CHARGE) OPTIME
TOPICAL | Status: DC | PRN
  Administered 2016-09-14 (×3): 1 via TOPICAL

## 2016-09-14 MED ORDER — ATORVASTATIN CALCIUM 10 MG PO TABS
20.0000 mg | ORAL_TABLET | Freq: Every day | ORAL | Status: DC
  Administered 2016-09-15 – 2016-09-16 (×2): 20 mg via ORAL
  Filled 2016-09-14 (×2): qty 2

## 2016-09-14 MED ORDER — HYDROMORPHONE HCL 1 MG/ML IJ SOLN
0.2500 mg | INTRAMUSCULAR | Status: DC | PRN
  Administered 2016-09-14: 0.5 mg via INTRAVENOUS

## 2016-09-14 MED ORDER — ACETAMINOPHEN 650 MG RE SUPP: 650.0000 mg | RECTAL | Status: DC | PRN

## 2016-09-14 MED ORDER — LACTATED RINGERS IV SOLN
INTRAVENOUS | Status: DC | PRN
  Administered 2016-09-14 (×3): via INTRAVENOUS

## 2016-09-14 MED ORDER — HYDROMORPHONE HCL 1 MG/ML IJ SOLN
INTRAMUSCULAR | Status: AC
  Administered 2016-09-14: 0.5 mg via INTRAVENOUS
  Filled 2016-09-14: qty 0.5

## 2016-09-14 MED ORDER — SODIUM CHLORIDE 0.9% FLUSH: 3.0000 mL | INTRAVENOUS | Status: DC | PRN

## 2016-09-14 MED ORDER — MIDAZOLAM HCL 5 MG/5ML IJ SOLN
INTRAMUSCULAR | Status: DC | PRN
  Administered 2016-09-14 (×2): 1 mg via INTRAVENOUS

## 2016-09-14 MED ORDER — SUGAMMADEX SODIUM 200 MG/2ML IV SOLN
INTRAVENOUS | Status: AC
  Filled 2016-09-14: qty 2

## 2016-09-14 MED ORDER — THROMBIN 20000 UNITS EX KIT
PACK | CUTANEOUS | Status: DC | PRN
  Administered 2016-09-14: 20000 [IU] via TOPICAL

## 2016-09-14 MED ORDER — DEXTROSE 5 % IV SOLN
INTRAVENOUS | Status: DC | PRN
  Administered 2016-09-14: 45 ug/min via INTRAVENOUS

## 2016-09-14 MED ORDER — LACTATED RINGERS IV SOLN
INTRAVENOUS | Status: DC
  Administered 2016-09-14: 14:00:00 via INTRAVENOUS

## 2016-09-14 MED ORDER — ALBUTEROL SULFATE (2.5 MG/3ML) 0.083% IN NEBU
INHALATION_SOLUTION | RESPIRATORY_TRACT | Status: AC
  Filled 2016-09-14: qty 3

## 2016-09-14 MED ORDER — ONDANSETRON HCL 4 MG PO TABS
4.0000 mg | ORAL_TABLET | Freq: Four times a day (QID) | ORAL | Status: DC | PRN
Start: 2016-09-14 — End: 2016-09-16

## 2016-09-14 SURGICAL SUPPLY — 70 items
AGENT HMST KT MTR STRL THRMB (HEMOSTASIS) ×3
BNDG GAUZE ELAST 4 BULKY (GAUZE/BANDAGES/DRESSINGS) ×3 IMPLANT
BUR EGG ELITE 4.0 (BURR) IMPLANT
BUR EGG ELITE 4.0MM (BURR)
CLOSURE WOUND 1/2 X4 (GAUZE/BANDAGES/DRESSINGS) ×1
CORDS BIPOLAR (ELECTRODE) ×3 IMPLANT
DRAIN CHANNEL 15F RND FF W/TCR (WOUND CARE) ×2 IMPLANT
DRAPE C-ARM 42X72 X-RAY (DRAPES) ×3 IMPLANT
DRAPE POUCH INSTRU U-SHP 10X18 (DRAPES) ×3 IMPLANT
DRAPE SURG 17X11 SM STRL (DRAPES) ×3 IMPLANT
DRAPE U-SHAPE 47X51 STRL (DRAPES) ×3 IMPLANT
DRSG AQUACEL AG ADV 3.5X 6 (GAUZE/BANDAGES/DRESSINGS) ×3 IMPLANT
DRSG MEPILEX BORDER 4X4 (GAUZE/BANDAGES/DRESSINGS) ×2 IMPLANT
DRSG OPSITE POSTOP 4X8 (GAUZE/BANDAGES/DRESSINGS) ×2 IMPLANT
DURAPREP 26ML APPLICATOR (WOUND CARE) ×3 IMPLANT
ELECT BLADE 4.0 EZ CLEAN MEGAD (MISCELLANEOUS) ×3
ELECT CAUTERY BLADE 6.4 (BLADE) ×3 IMPLANT
ELECT PENCIL ROCKER SW 15FT (MISCELLANEOUS) ×3 IMPLANT
ELECT REM PT RETURN 9FT ADLT (ELECTROSURGICAL) ×3
ELECTRODE BLDE 4.0 EZ CLN MEGD (MISCELLANEOUS) ×1 IMPLANT
ELECTRODE REM PT RTRN 9FT ADLT (ELECTROSURGICAL) ×1 IMPLANT
EVACUATOR SILICONE 100CC (DRAIN) ×2 IMPLANT
FLOSEAL (HEMOSTASIS) IMPLANT
GLOVE BIO SURGEON STRL SZ 6.5 (GLOVE) ×2 IMPLANT
GLOVE BIO SURGEONS STRL SZ 6.5 (GLOVE) ×1
GLOVE BIOGEL PI IND STRL 6.5 (GLOVE) ×1 IMPLANT
GLOVE BIOGEL PI IND STRL 7.0 (GLOVE) IMPLANT
GLOVE BIOGEL PI IND STRL 8.5 (GLOVE) ×1 IMPLANT
GLOVE BIOGEL PI INDICATOR 6.5 (GLOVE) ×2
GLOVE BIOGEL PI INDICATOR 7.0 (GLOVE) ×2
GLOVE BIOGEL PI INDICATOR 8.5 (GLOVE) ×2
GLOVE SS BIOGEL STRL SZ 8.5 (GLOVE) ×1 IMPLANT
GLOVE SUPERSENSE BIOGEL SZ 8.5 (GLOVE) ×2
GLOVE SURG SS PI 7.0 STRL IVOR (GLOVE) ×2 IMPLANT
GOWN STRL REUS W/ TWL LRG LVL3 (GOWN DISPOSABLE) ×1 IMPLANT
GOWN STRL REUS W/TWL 2XL LVL3 (GOWN DISPOSABLE) ×6 IMPLANT
GOWN STRL REUS W/TWL LRG LVL3 (GOWN DISPOSABLE) ×3
KIT BASIN OR (CUSTOM PROCEDURE TRAY) ×3 IMPLANT
NDL SPNL 18GX3.5 QUINCKE PK (NEEDLE) ×2 IMPLANT
NEEDLE 22X1 1/2 (OR ONLY) (NEEDLE) ×3 IMPLANT
NEEDLE SPNL 18GX3.5 QUINCKE PK (NEEDLE) ×6 IMPLANT
NS IRRIG 1000ML POUR BTL (IV SOLUTION) ×3 IMPLANT
PACK LAMINECTOMY ORTHO (CUSTOM PROCEDURE TRAY) ×3 IMPLANT
PACK UNIVERSAL I (CUSTOM PROCEDURE TRAY) ×3 IMPLANT
PATTIES SURGICAL .5 X.5 (GAUZE/BANDAGES/DRESSINGS) IMPLANT
PATTIES SURGICAL .5 X1 (DISPOSABLE) ×5 IMPLANT
SPONGE LAP 4X18 X RAY DECT (DISPOSABLE) ×6 IMPLANT
SPONGE SURGIFOAM ABS GEL 100 (HEMOSTASIS) ×3 IMPLANT
STAPLER VISISTAT 35W (STAPLE) IMPLANT
STRIP CLOSURE SKIN 1/2X4 (GAUZE/BANDAGES/DRESSINGS) ×1 IMPLANT
SURGIFLO W/THROMBIN 8M KIT (HEMOSTASIS) ×6 IMPLANT
SUT BONE WAX W31G (SUTURE) ×3 IMPLANT
SUT ETHILON 2 0 FS 18 (SUTURE) ×2 IMPLANT
SUT MNCRL AB 3-0 PS2 18 (SUTURE) ×2 IMPLANT
SUT MON AB 3-0 SH 27 (SUTURE) ×3
SUT MON AB 3-0 SH27 (SUTURE) ×1 IMPLANT
SUT VIC AB 0 CT1 27 (SUTURE) ×3
SUT VIC AB 0 CT1 27XBRD ANBCTR (SUTURE) IMPLANT
SUT VIC AB 1 CT1 18XCR BRD 8 (SUTURE) ×1 IMPLANT
SUT VIC AB 1 CT1 27 (SUTURE) ×6
SUT VIC AB 1 CT1 27XBRD ANTBC (SUTURE) ×2 IMPLANT
SUT VIC AB 1 CT1 8-18 (SUTURE) ×3
SUT VIC AB 2-0 CT1 18 (SUTURE) ×6 IMPLANT
SUT VICRYL 0 UR6 27IN ABS (SUTURE) ×3 IMPLANT
SYR BULB IRRIGATION 50ML (SYRINGE) ×3 IMPLANT
SYR CONTROL 10ML LL (SYRINGE) ×3 IMPLANT
TOWEL OR 17X26 10 PK STRL BLUE (TOWEL DISPOSABLE) ×6 IMPLANT
TRAY FOLEY W/METER SILVER 16FR (SET/KITS/TRAYS/PACK) ×2 IMPLANT
WATER STERILE IRR 1000ML POUR (IV SOLUTION) ×3 IMPLANT
YANKAUER SUCT BULB TIP NO VENT (SUCTIONS) ×3 IMPLANT

## 2016-09-14 NOTE — Transfer of Care (Signed)
Immediate Anesthesia Transfer of Care Note  Patient: Daniel Short  Procedure(s) Performed: Procedure(s) with comments: Decompression L3-5, insitu fusion L4-5 (N/A) - 3 hrs  Patient Location: PACU  Anesthesia Type:General  Level of Consciousness: awake and alert   Airway & Oxygen Therapy: Patient Spontanous Breathing and Patient connected to nasal cannula oxygen  Post-op Assessment: Report given to RN and Post -op Vital signs reviewed and stable  Post vital signs: Reviewed and stable  Last Vitals:  Vitals:   09/14/16 1139  BP: 136/73  Pulse: (!) 55  Resp: 20  Temp: 37 C    Last Pain:  Vitals:   09/14/16 1310  TempSrc:   PainSc: 6          Complications: No apparent anesthesia complications

## 2016-09-14 NOTE — Brief Op Note (Signed)
09/14/2016  5:42 PM  PATIENT:  [name]  71 y.o. male  PRE-OPERATIVE DIAGNOSIS:  Grade 1 slip L4-5, stenosis L3-5  POST-OPERATIVE DIAGNOSIS:  Grade 1 slip L4-5, stenosis L3-5  PROCEDURE:  Procedure(s) with comments: Decompression L3-5, insitu fusion L4-5 (N/A) - 3 hrs  SURGEON:  Surgeon(s) and Role:    Melina Schools, MD - Primary  PHYSICIAN ASSISTANT:   ASSISTANTS: carmen mayo   ANESTHESIA:   general  EBL:  Total I/O In: 2100 [I.V.:1600; IV Piggyback:500] Out: 600 [Urine:300; Blood:300]  BLOOD ADMINISTERED:none  DRAINS: hemovac in the back   LOCAL MEDICATIONS USED:  MARCAINE     SPECIMEN:  No Specimen  DISPOSITION OF SPECIMEN:  N/A  COUNTS:  YES  TOURNIQUET:  * No tourniquets in log *  DICTATION: .Dragon Dictation  PLAN OF CARE: Admit to inpatient   PATIENT DISPOSITION:  PACU - hemodynamically stable.

## 2016-09-14 NOTE — Anesthesia Procedure Notes (Signed)
Procedure Name: Intubation Date/Time: 09/14/2016 2:38 PM Performed by: Jacquiline Doe A Pre-anesthesia Checklist: Patient identified, Emergency Drugs available, Suction available and Patient being monitored Patient Re-evaluated:Patient Re-evaluated prior to inductionOxygen Delivery Method: Circle System Utilized and Circle system utilized Preoxygenation: Pre-oxygenation with 100% oxygen Intubation Type: IV induction and Cricoid Pressure applied Ventilation: Mask ventilation without difficulty Laryngoscope Size: Mac and 4 Grade View: Grade I Tube type: Oral Tube size: 8.0 mm Number of attempts: 1 Airway Equipment and Method: Stylet Placement Confirmation: ETT inserted through vocal cords under direct vision,  positive ETCO2 and breath sounds checked- equal and bilateral Secured at: 23 cm Tube secured with: Tape Dental Injury: Teeth and Oropharynx as per pre-operative assessment

## 2016-09-14 NOTE — Anesthesia Preprocedure Evaluation (Addendum)
Anesthesia Evaluation  Patient identified by MRN, date of birth, ID band Patient awake    Reviewed: Allergy & Precautions, NPO status   Airway Mallampati: II  TM Distance: <3 FB Neck ROM: Limited    Dental  (+) Edentulous Upper   Pulmonary shortness of breath, Current Smoker,    + rhonchi  + wheezing      Cardiovascular hypertension, + CAD and + Past MI   Rhythm:Regular Rate:Normal  Stress test results noted   Neuro/Psych    GI/Hepatic negative GI ROS, Neg liver ROS,   Endo/Other  negative endocrine ROS  Renal/GU negative Renal ROS     Musculoskeletal  (+) Arthritis ,   Abdominal (+) + obese,   Peds  Hematology negative hematology ROS (+)   Anesthesia Other Findings   Reproductive/Obstetrics                           Anesthesia Physical Anesthesia Plan  ASA: III  Anesthesia Plan: General   Post-op Pain Management:    Induction: Intravenous  PONV Risk Score and Plan: 3 and Ondansetron, Dexamethasone, Propofol and Midazolam  Airway Management Planned: Oral ETT and Video Laryngoscope Planned  Additional Equipment:   Intra-op Plan:   Post-operative Plan: Extubation in OR and Possible Post-op intubation/ventilation  Informed Consent: I have reviewed the patients History and Physical, chart, labs and discussed the procedure including the risks, benefits and alternatives for the proposed anesthesia with the patient or authorized representative who has indicated his/her understanding and acceptance.   Dental advisory given  Plan Discussed with: CRNA and Anesthesiologist  Anesthesia Plan Comments:        Anesthesia Quick Evaluation

## 2016-09-14 NOTE — Op Note (Signed)
Operative note.  Preoperative diagnosis:  lumbar spinal stenosis L3-4 L4-5      Grade 1 degenerative spondylolisthesis L4-5. Postoperative diagnosis same  Operative procedure  L3-L5 posterior decompression     L4-5 in situ fusion   Complications none  First assistant Asencion Partridge male  Indications. 71 year old male who is had progressive debilitating back buttock and bilateral leg pain consistent with spinal stenosis with neurogenic claudication. Attempts at conservative management fail to alleviate his pain. Despite injection therapy physical therapy and medications he continued to deteriorate and had a very poor quality of life. After discussing treatment options we elected to proceed with surgery. I did review all appropriate risks benefits and alternatives. We obtained preoperative medical clearance from his primary care physician and cardiologist.   Operative note. patient was brought to the operating room placed on the operating table. After successful induction general anesthesia and endotracheal intubation teds SCDs and Foley were inserted. He was turned prone onto the Wilson frame and all bony prominences were well padded.  The back was then prepped and draped in a standard fashion. Timeout was taken to confirm patient procedure and all other pertinent important data.  2 needles were placed into the back to confirm the level for incision with x-ray.  The incision site was marked and infiltrated with quarter percent Marcaine with epinephrine. Midline incision was made, and sharp dissection was carried down to the deep fascia using the deep fascia was then incised and stripped with a Cobb elevator to expose the spinous processes and lamina of L3-L4 and L4-5. I then exposed the L4-5 facet complex and the transverse process of L3,4, and 5.  Once the exposure was complete I placed a Penfield 4 underneath the L4 lamina and took an intraoperative lateral x-ray. This confirmed that I was at the L4-5  level should be noted that compared to a previous x-ray done in February the patient had a transitional lumbar vertebrae. Therefore the radiologist contacted me and thought I was at L5-S1. After reviewing the x-rays I realized that based on the preoperative MRI that I was at the appropriate level for the decompression  The spinous processes of L4 was removed as well as the bulk of the L3 spinous process. Using Kerrison rongeurs I performed a complete laminectomy of L4 and a subtotal laminotomy of L3. I removed the very thickened ligamentum flavum in the central region using Penfield 4 and 2 and 3 mm Kerrison punches. I also removed the superior portion of the L5 spinous process and lamina in order to adequately decompress the spine from L3-L5. Once the central decompression was complete I used my Penfield 4 dissector into the lateral recess and went to 3 mm Kerrison punch to remove all the overhanging osteophytes from the facet as well as the thickened ligamentum flavum causing spinal stenosis. I then performed a foraminotomy of L3 and L4 and L5.  Once the decompression was complete I could then easily used my [facility] and pass it along the entire length of the decompression centrally in the lateral recess and out each foramen without any difficulty. At this point I was pleased with the overall decompression I then decorticated the transverse processes with a high-speed bur and used the autograft harvested from the decompression for my in situ posterior lateral arthrodesis.  Once this was complete I did take a final x-ray confirming that I was spanning the area of maximal spinal stenosis. At this point the wound was copiously irrigated with normal saline and  I used bipolar cautery as well as FloSeal to obtain hemostasis. A drain was placed and brought out of a separate stab incision.  The wound was closed in a layered fashion with interrupted #1 Vicryl suture 2-0 Vicryl suture and a 3-0 Monocryl.  Steri-Strips dry dressings were applied and the patient was ultimately extubated and transferred to PACU that incident.  At the end of the case all needle and sponge counts were correct there were no adverse intraoperative events.

## 2016-09-14 NOTE — Anesthesia Postprocedure Evaluation (Signed)
Anesthesia Post Note  Patient: Daniel Short  Procedure(s) Performed: Procedure(s) (LRB): Decompression L3-5, insitu fusion L4-5 (N/A)     Patient location during evaluation: PACU Anesthesia Type: General Level of consciousness: awake Pain management: pain level controlled Vital Signs Assessment: post-procedure vital signs reviewed and stable Respiratory status: spontaneous breathing Cardiovascular status: stable Anesthetic complications: no    Last Vitals:  Vitals:   09/14/16 1139  BP: 136/73  Pulse: (!) 55  Resp: 20  Temp: 37 C    Last Pain:  Vitals:   09/14/16 1310  TempSrc:   PainSc: 6                  Chapman Matteucci

## 2016-09-14 NOTE — H&P (Signed)
History of Present Illness  The patient is a 71 year old male who comes in today for a preoperative History and Physical. The patient is scheduled for a decompression L3-5, insitu fusion to be performed by Dr. Duane Lope D. Rolena Infante, MD at [facility] on 09-14-16 . Please see the hospital record for complete dictated history and physical. The pt has heart stents and has just had a stress test this am. We do not have the results yet. Pt had a hemorage on his brain in year 2000. The pt is on chronic aspirin.   Problem List/Past Medical  Chronic midline low back pain without sciatica (M54.5)  Lumbar DDD (M51.36)  Spondylolisthesis of lumbar region (M43.16)  Carpal tunnel syndrome of left wrist (G56.02)  Spinal stenosis of lumbar region with neurogenic claudication (Y85.027)  Problems Reconciled   Allergies No Known Drug Allergies Allergies Reconciled   Family History  Chronic Obstructive Lung Disease  father Congestive Heart Failure  brother Heart Disease  mother  Social History  Alcohol use  never consumed alcohol Children  2 Current work status  retired Engineer, agricultural (Currently)  no Drug/Alcohol Rehab (Previously)  no Exercise  does running / walking Illicit drug use  no Living situation  live with spouse Marital status  married Pain Contract  no Tobacco / smoke exposure  no Tobacco use  current every day smoker; smoke(d) 1/2 pack(s) per day  Medication History Lyrica (75MG  Capsule, Oral) Active. (bid) HydroCHLOROthiazide (25MG  Tablet, Oral) Active. (qd) Atorvastatin Calcium (20MG  Tablet, Oral) Active. (qd) Aspirin (325MG  Tablet, Oral) Active. (qd) TraMADol HCl  Other Problems  Bleeding disorder  Blood Clot  Coronary artery disease  Gout  High blood pressure  Hypercholesterolemia  Myocardial infarction   Vitals  09/09/2016 11:46 AM Weight: 219 lb Height: 66in Body Surface Area: 2.08 m Body Mass Index: 35.35 kg/m   Temp.: 98.76F(Oral)  Pulse: 68 (Regular)  BP: 141/91 (Sitting, Left Arm, Standard)  General General Appearance-Not in acute distress. Orientation-Oriented X3. Build & Nutrition-Well nourished and Well developed.  Integumentary General Characteristics Surgical Scars - no surgical scar evidence of previous lumbar surgery. Lumbar Spine-Skin examination of the lumbar spine is without deformity, skin lesions, lacerations or abrasions.  Chest and Lung Exam Auscultation Breath sounds - Rattling - Right Lung Field and Left Lung Field. Stridor - Right Lung Field and Left Lung Field. Hissing sound - Right Lung Field and Left Lung Field.  Cardiovascular Auscultation Rhythm - Regular rate and rhythm.  Abdomen Palpation/Percussion Palpation and Percussion of the abdomen reveal - Soft, Non Tender and No Rebound tenderness.  Peripheral Vascular Lower Extremity Palpation - Posterior tibial pulse - Bilateral - 2+. Dorsalis pedis pulse - Bilateral - 2+.  Neurologic Sensation Lower Extremity - Bilateral - sensation is intact in the lower extremity. Reflexes Patellar Reflex - Bilateral - 2+. Achilles Reflex - Bilateral - 2+. Clonus - Bilateral - clonus not present. Hoffman's Sign - Bilateral - Hoffman's sign not present. Testing Seated Straight Leg Raise - Bilateral - Seated straight leg raise negative.  Musculoskeletal Spine/Ribs/Pelvis  Lumbosacral Spine: Inspection and Palpation - Tenderness - no soft tissue tenderness to palpation and no bony tenderness to palpation, bony and soft tissue palpation of the lumbar spine and SI joint does not recreate their typical pain. Strength and Tone: Strength - Hip Flexion - Bilateral - 5/5. Knee Extension - Bilateral - 5/5. Knee Flexion - Bilateral - 5/5. Ankle Dorsiflexion - Bilateral - 5/5. Ankle Plantarflexion - Bilateral - 5/5. Heel walk -  Bilateral - able to heel walk without difficulty. Toe Walk - Bilateral - able to walk on toes  without difficulty. Heel-Toe Walk - Bilateral - able to heel-toe walk without difficulty. ROM - Flexion - moderately decreased range of motion and painful. Extension - moderately decreased range of motion and painful. Left Lateral Bending - moderately decreased range of motion and painful. Right Lateral Bending - moderately decreased range of motion and painful. Right Rotation - moderately decreased range of motion and painful. Left Rotation - moderately decreased range of motion and painful. Pain - neither flexion or extension is more painful than the other. Lumbosacral Spine - Waddell's Signs - no Waddell's signs present. Lower Extremity Range of Motion - No true hip, knee or ankle pain with range of motion. Gait and Station - [employer] - no assistive devices.  MRI from 08/10/2016 clearly shows severe of canal stenosis at L3-L4, and L4-5.  Also a grade 1 degenerative slip at L4-L5 with lateral recess and foraminal stenosis.  A/P At this point in time, the patient has classic spinal stenosis with neurogenic claudication.. His quality of life is severely limited and the pain is significant. At this point, we have discussed a lumbar decompression as the primary means of alleviating the stenosis to address his pain. Because he has a low grade spondylolisthesis at L4-L5, I would likely also do an insight fusion to prevent iatrogenic worsening of the slip. I do not think the slip is significant enough to warrant screws and an instrumented fusion.   Goal Of Surgery: Discussed that goal of surgery is to reduce pain and improve function and quality of life. Patient is aware that despite all appropriate treatment that there pain and function could be the same, worse, or different.  Posterior Lumbar Decompression/disectomy: Risks of surgery include infection, bleeding, nerve damage, death, stroke, paralysis, failure to heal, need for further surgery, ongoing or worse pain, need for further surgery, CSF  leak, loss of bowel or bladder, and recurrent disc herniation or Stenosis which would necessitate need for further surgery.

## 2016-09-15 ENCOUNTER — Encounter (HOSPITAL_COMMUNITY): Payer: Self-pay | Admitting: Orthopedic Surgery

## 2016-09-15 LAB — POCT I-STAT 4, (NA,K, GLUC, HGB,HCT)
Glucose, Bld: 122 mg/dL — ABNORMAL HIGH (ref 65–99)
HEMATOCRIT: 41 % (ref 39.0–52.0)
HEMOGLOBIN: 13.9 g/dL (ref 13.0–17.0)
Potassium: 3.8 mmol/L (ref 3.5–5.1)
SODIUM: 139 mmol/L (ref 135–145)

## 2016-09-15 MED ORDER — CEFAZOLIN SODIUM-DEXTROSE 2-4 GM/100ML-% IV SOLN
2.0000 g | Freq: Three times a day (TID) | INTRAVENOUS | Status: DC
  Administered 2016-09-15 – 2016-09-16 (×2): 2 g via INTRAVENOUS
  Filled 2016-09-15 (×5): qty 100

## 2016-09-15 MED ORDER — ONDANSETRON 4 MG PO TBDP: 4.0000 mg | ORAL_TABLET | Freq: Three times a day (TID) | ORAL | 0 refills | Status: DC | PRN

## 2016-09-15 MED ORDER — METHOCARBAMOL 500 MG PO TABS: 500.0000 mg | ORAL_TABLET | Freq: Four times a day (QID) | ORAL | 0 refills | Status: DC | PRN

## 2016-09-15 MED ORDER — OXYCODONE HCL 10 MG PO TABS: 10.0000 mg | ORAL_TABLET | ORAL | 0 refills | Status: AC | PRN

## 2016-09-15 MED FILL — Thrombin For Soln 20000 Unit: CUTANEOUS | Qty: 1 | Status: AC

## 2016-09-15 NOTE — Progress Notes (Signed)
    Subjective: 1 Day Post-Op Procedure(s) (LRB): Decompression L3-5, insitu fusion L4-5 (N/A) Patient reports pain as 4 on 0-10 scale.   Denies CP or SOB.  Foley has just been pulled.  Pt has not voided yet.  Positive flatus. Objective: Vital signs in last 24 hours: Temp:  [97.5 F (36.4 C)-98.6 F (37 C)] 98 F (36.7 C) (06/21 0513) Pulse Rate:  [55-85] 76 (06/21 0513) Resp:  [12-20] 20 (06/21 0513) BP: (101-136)/(55-81) 110/57 (06/21 0513) SpO2:  [93 %-98 %] 94 % (06/21 0513) Weight:  [99.3 kg (219 lb)-100.2 kg (220 lb 12.8 oz)] 100.2 kg (220 lb 12.8 oz) (06/20 2104)  Intake/Output from previous day: 06/20 0701 - 06/21 0700 In: 4901.1 [P.O.:600; I.V.:3701.1; IV Piggyback:600] Out: 2435 [Urine:1950; Drains:135; Blood:350] Intake/Output this shift: No intake/output data recorded.  Labs: No results for input(s): HGB in the last 72 hours. No results for input(s): WBC, RBC, HCT, PLT in the last 72 hours. No results for input(s): NA, K, CL, CO2, BUN, CREATININE, GLUCOSE, CALCIUM in the last 72 hours. No results for input(s): LABPT, INR in the last 72 hours.  Physical Exam: Neurologically intact ABD soft Sensation intact distally Dorsiflexion/Plantar flexion intact Incision: no drainage Compartment soft  Assessment/Plan: 1 Day Post-Op Procedure(s) (LRB): Decompression L3-5, insitu fusion L4-5 (N/A) Advance diet Up with therapy    [place], Darla Lesches for Dr. Melina Schools [facility] [phone] 09/15/2016, 7:34 AM    Patient ID: Daniel Short, male   DOB: [birth year], 71 y.o.   MRN: [id number]

## 2016-09-15 NOTE — Progress Notes (Signed)
Patient foley removed at this time patient due to void after noon. Will continue to monitor and update on coming nurse.

## 2016-09-15 NOTE — Anesthesia Postprocedure Evaluation (Signed)
Anesthesia Post Note  Patient: Daniel Short  Procedure(s) Performed: Procedure(s) (LRB): Decompression L3-5, insitu fusion L4-5 (N/A)     Patient location during evaluation: PACU Anesthesia Type: General Level of consciousness: awake Pain management: pain level controlled Vital Signs Assessment: post-procedure vital signs reviewed and stable Respiratory status: spontaneous breathing Cardiovascular status: stable Anesthetic complications: no    Last Vitals:  Vitals:   09/15/16 1030 09/15/16 1334  BP: 119/74 (!) 111/55  Pulse: 75 (!) 57  Resp: 16 20  Temp: 36.5 C 36.5 C    Last Pain:  Vitals:   09/15/16 1334  TempSrc: Oral  PainSc:                  [name]

## 2016-09-15 NOTE — Clinical Social Work Note (Signed)
CSW consulted for "Skilled nursing facility placement." P/T and O/T recommend no follow up. CSW signing off as no further Social Work needs identified. Please reconsult if new Social Work needs arise.   Oretha Ellis, [place], [place] Work  [phone]

## 2016-09-15 NOTE — Progress Notes (Signed)
Patient ID: Daniel Short, male   DOB: 08-Dec-1945, 71 y.o.   MRN: [id number]  Called nurse in [facility] taking care of this pt.  She said his drain input for today was 90 so far.  After discussion with Dr. Rolena Infante we will keep this pt until the am.  Dr. Rolena Infante will roun on him in the morning.  I continued Ancef q8.  Ronette Deter, [facility]

## 2016-09-15 NOTE — Care Management Obs Status (Signed)
[facility] NOTIFICATION   Patient Details  Name: DONIE LEMELIN MRN: [id number] Date of Birth: May 17, 1945   Medicare Observation Status Notification Given:  Yes    Pollie Friar, RN 09/15/2016, 5:06 PM

## 2016-09-15 NOTE — Progress Notes (Signed)
Occupational Therapy Evaluation Patient Details Name: Daniel Short MRN: [id number] DOB: December 25, 1945 Today's Date: 09/15/2016    History of Present Illness L3-L5 posterior decompression; L4-5 fusion. See chart for  PMH   Clinical Impression   PTA, pt independent with ADL and mobility without an AD. Began education regarding back precautions for ADL and functional mobility. Educated on available AE and compensatory techniques for ADL. Son present for session and verbalized understanding. Pt wanting to DC home today. Feel pt safe to DC home with initial 24/7 S when medically stable.     Follow Up Recommendations  No OT follow up;Supervision/Assistance - 24 hour (initially)    Equipment Recommendations  Other (comment) (RW)    Recommendations for Other Services PT consult     Precautions / Restrictions Precautions Precautions: Back Precaution Booklet Issued: Yes (comment) Required Braces or Orthoses: Spinal Brace Spinal Brace: Lumbar corset;Applied in sitting position Restrictions Weight Bearing Restrictions: No      Mobility Bed Mobility Overal bed mobility: Needs Assistance Bed Mobility: Rolling;Sidelying to Sit Rolling: Supervision Sidelying to sit: Min guard       General bed mobility comments: vc for correct technique  Transfers Overall transfer level: Needs assistance Equipment used: Rolling walker (2 wheeled) Transfers: Sit to/from Stand Sit to Stand: Min guard         General transfer comment: multiple cues to not pull up on RW. Pt became frustrated and stated "this is how I need to do it"    Balance Overall balance assessment: No apparent balance deficits (not formally assessed)                                         ADL either performed or assessed with clinical judgement   ADL Overall ADL's : Needs assistance/impaired     Grooming: Supervision/safety;Set up;Standing   Upper Body Bathing: Set up;Supervision/ safety;Sitting    Lower Body Bathing: Moderate assistance;Sit to/from stand   Upper Body Dressing : Supervision/safety;Set up;Sitting   Lower Body Dressing: Moderate assistance;Sit to/from stand   Toilet Transfer: Supervision/safety;RW;Comfort height toilet;Ambulation   Toileting- Clothing Manipulation and Hygiene: Moderate assistance Toileting - Clothing Manipulation Details (indicate cue type and reason): educated on use of AE for hygiene after toileting Tub/ Shower Transfer: Supervision/safety   Functional mobility during ADLs: Supervision/safety;Rolling walker;Cueing for safety General ADL Comments: Educated pt/son on back precautions for ADL and funcitonal mobility. Educated on AE to assist with LB ADL. Educated on reducing risk of falls and proper home set up to increase adherance to back precautions and maximize independence     Vision         Perception     Praxis      Pertinent Vitals/Pain Pain Assessment: 0-10 Pain Score: 4  Pain Location: back Pain Descriptors / Indicators: Aching Pain Intervention(s): Limited activity within patient's tolerance     Hand Dominance Right   Extremity/Trunk Assessment Upper Extremity Assessment Upper Extremity Assessment: Overall WFL for tasks assessed   Lower Extremity Assessment Lower Extremity Assessment: Defer to PT evaluation   Cervical / Trunk Assessment Cervical / Trunk Assessment: Other exceptions;Kyphotic (back fusion)   Communication Communication Communication: No difficulties   Cognition Arousal/Alertness: Awake/alert Behavior During Therapy: Flat affect Overall Cognitive Status: Within Functional Limits for tasks assessed  General Comments: slow processing/decreased STM. pt states he didn't sleep last night. Son states his cognition is normal   General Comments       Exercises     Shoulder Instructions      Home Living Family/patient expects to be discharged to:: Private  residence Living Arrangements: Spouse/significant other;Children Available Help at Discharge: Family;Available 24 hours/day Type of Home: House Home Access: Stairs to enter [employer] of Steps: 1 Entrance Stairs-Rails: None Home Layout: One level     Bathroom Shower/Tub: Occupational psychologist: Handicapped height Bathroom Accessibility: Yes How Accessible: Accessible via walker Home Equipment: Walker - standard;Cane - single point;Shower seat - built in          Prior Functioning/Environment Level of Independence: Independent        Comments: drives        OT Problem List: Decreased activity tolerance;Decreased safety awareness;Decreased knowledge of use of DME or AE;Decreased knowledge of precautions;Pain;Obesity      OT Treatment/Interventions: Self-care/ADL training;DME and/or AE instruction;Therapeutic activities;Patient/family education    OT Goals(Current goals can be found in the care plan section) Acute Rehab OT Goals Patient Stated Goal: to go home today OT Goal Formulation: With patient/family Time For Goal Achievement: 09/22/16 Potential to Achieve Goals: Good ADL Goals Pt Will Perform Lower Body Bathing: with caregiver independent in assisting;with adaptive equipment;sit to/from stand;with supervision Pt Will Perform Lower Body Dressing: with supervision;with caregiver independent in assisting;with adaptive equipment;sit to/from stand Additional ADL Goal #1: Pt/family will independently verbalize understanding of 3/3/ back precautions  OT Frequency: Min 2X/week   Barriers to D/C:            Co-evaluation              AM-PAC PT "6 Clicks" Daily Activity     Outcome Measure Help from another person eating meals?: None Help from another person taking care of personal grooming?: A Little Help from another person toileting, which includes using toliet, bedpan, or urinal?: A Little Help from another person bathing (including  washing, rinsing, drying)?: A Little Help from another person to put on and taking off regular upper body clothing?: None Help from another person to put on and taking off regular lower body clothing?: A Little 6 Click Score: 20   End of Session Equipment Utilized During Treatment: Rolling walker;Back brace Nurse Communication: Mobility status;Precautions  Activity Tolerance: Patient tolerated treatment well Patient left: in chair;with call bell/phone within reach;with family/visitor present  OT Visit Diagnosis: Other abnormalities of gait and mobility (R26.89);Pain;Muscle weakness (generalized) (M62.81) Pain - part of body:  (back)                Time: 7782-4235 OT Time Calculation (min): 34 min Charges:  OT General Charges $OT Visit: 1 Procedure OT Evaluation $OT Eval Moderate Complexity: 1 Procedure OT Treatments $Self Care/Home Management : 8-22 mins G-Codes: OT G-codes **NOT FOR INPATIENT CLASS** Functional Assessment Tool Used: Clinical judgement Functional Limitation: Self care Self Care Current Status (T6144): At least 40 percent but less than 60 percent impaired, limited or restricted Self Care Goal Status (R1540): At least 1 percent but less than 20 percent impaired, limited or restricted   [facility], OT/L  [phone] 09/15/2016  Ruthanna Macchia,H[place] 09/15/2016, 10:28 AM

## 2016-09-15 NOTE — Evaluation (Addendum)
Physical Therapy Evaluation Patient Details Name: Daniel Short MRN: [id number] DOB: 06/05/1945 Today's Date: 09/15/2016   History of Present Illness  L3-L5 posterior decompression; L4-5 fusion. See chart for  PMH  Clinical Impression  Patient is s/p above surgery resulting in functional limitations due to the deficits listed below (see PT Problem List). Pt is supervision for bed mobility, transfers, ambulation of 150 feet and ascent/descent of 1 step. Pt able to recall 3/3 back precautions. Patient's mobility is currently appropriate for discharge home however PT will continue to follow in the acute setting to progress ambulation and improve endurance to safely mobilize in his home environment.       Follow Up Recommendations No PT follow up;Supervision for mobility/OOB    Equipment Recommendations  None recommended by PT    Recommendations for Other Services OT consult     Precautions / Restrictions Precautions Precautions: Back Precaution Booklet Issued: Yes (comment) Required Braces or Orthoses: Spinal Brace Spinal Brace: Lumbar corset;Applied in sitting position Restrictions Weight Bearing Restrictions: No      Mobility  Bed Mobility Overal bed mobility: Needs Assistance Bed Mobility: Rolling;Sidelying to Sit Rolling: Supervision Sidelying to sit: Min guard       General bed mobility comments: vc for correct technique  Transfers Overall transfer level: Needs assistance Equipment used: Rolling walker (2 wheeled) Transfers: Sit to/from Stand Sit to Stand: Min guard         General transfer comment: vc for hand placement for ascend/descend  Ambulation/Gait Ambulation/Gait assistance: Supervision Ambulation Distance (Feet): 200 Feet Assistive device: Rolling walker (2 wheeled) Gait Pattern/deviations: Step-through pattern;Decreased step length - right;Decreased step length - left;Trunk flexed;Antalgic;Shuffle Gait velocity: slowed Gait velocity  interpretation: Below normal speed for age/gender General Gait Details: slow, steady candence, with slight shuffle secondary to pain, vc for upright posture  Stairs Stairs: Yes Stairs assistance: Supervision Stair Management: One rail Right Number of Stairs: 2 General stair comments: safe ascent/descent, vc for not twisting toward the stairs from RW      Balance Overall balance assessment: No apparent balance deficits (not formally assessed)                                           Pertinent Vitals/Pain Pain Assessment: 0-10 Pain Score: 4  Pain Location: back Pain Descriptors / Indicators: Aching Pain Intervention(s): Monitored during session;Limited activity within patient's tolerance  VSS    Home Living Family/patient expects to be discharged to:: Private residence Living Arrangements: Spouse/significant other;Children Available Help at Discharge: Family;Available 24 hours/day Type of Home: House Home Access: Stairs to enter Entrance Stairs-Rails: None Entrance Stairs-Number of Steps: 1 Home Layout: One level Home Equipment: Walker - standard;Cane - single point;Shower seat - built in      Prior Function Level of Independence: Independent         Comments: drives     Hand Dominance   Dominant Hand: Right    Extremity/Trunk Assessment   Upper Extremity Assessment Upper Extremity Assessment: Overall WFL for tasks assessed    Lower Extremity Assessment Lower Extremity Assessment: RLE deficits/detail;LLE deficits/detail RLE Deficits / Details: R hip unassessed, R knee and ankle ROM WFL, and strength 4-/5  RLE Sensation:  (intact) LLE Deficits / Details: L hip unassessed, L knee and ankle ROM WFL, strength 3+/5 LLE Sensation:  (intact)    Cervical / Trunk Assessment Cervical / Trunk Assessment: Other  exceptions;Kyphotic (back fusion)  Communication   Communication: No difficulties  Cognition Arousal/Alertness: Awake/alert Behavior  During Therapy: Flat affect Overall Cognitive Status: Within Functional Limits for tasks assessed                                 General Comments: slow processing/decreased STM. pt states he didn't sleep last night. Son states his cognition is normal             Assessment/Plan    PT Assessment Patient needs continued PT services  PT Problem List Decreased strength;Decreased range of motion;Decreased activity tolerance;Pain;Decreased mobility       PT Treatment Interventions Gait training;Stair training;DME instruction;Functional mobility training;Therapeutic activities;Therapeutic exercise;Patient/family education    PT Goals (Current goals can be found in the Care Plan section)  Acute Rehab PT Goals Patient Stated Goal: to go home today PT Goal Formulation: With patient Time For Goal Achievement: 09/22/16 Potential to Achieve Goals: Good    Frequency Min 5X/week    AM-PAC PT "6 Clicks" Daily Activity  Outcome Measure Difficulty turning over in bed (including adjusting bedclothes, sheets and blankets)?: None Difficulty moving from lying on back to sitting on the side of the bed? : None Difficulty sitting down on and standing up from a chair with arms (e.g., wheelchair, bedside commode, etc,.)?: None Help needed moving to and from a bed to chair (including a wheelchair)?: None Help needed walking in hospital room?: None Help needed climbing 3-5 steps with a railing? : A Little 6 Click Score: 23    End of Session   Activity Tolerance: Patient tolerated treatment well Patient left: in chair;with call bell/phone within reach;with family/visitor present Nurse Communication: Mobility status PT Visit Diagnosis: Other abnormalities of gait and mobility (R26.89);Pain;Muscle weakness (generalized) (M62.81) Pain - part of body:  (back)    Time: 1025-1103 PT Time Calculation (min) (ACUTE ONLY): 38 min   Charges:   PT Evaluation $PT Eval Low Complexity: 1  Procedure PT Treatments $Gait Training: 8-22 mins   PT G Codes:        Kacee Koren B. Migdalia Dk PT, DPT Acute Rehabilitation  [phone] Pager [phone]    [place] 09/15/2016, 11:20 AM

## 2016-09-15 NOTE — Progress Notes (Signed)
Patient admitted to 5C15 from PACU. HD#1 POD#0; S/P posterior decompression L3-L5; Assessment complete; skin assessment complete; IV: RA; no c/o of pain; fall prevention plan discussed with patient; family visiting; orders reviewed and implemented.  Call light within reach and bed alarm activated. Will continue to monitor patient.

## 2016-09-15 NOTE — Care Management CC44 (Signed)
Condition Code 44 Documentation Completed  Patient Details  Name: Daniel Short MRN: [id number] Date of Birth: [birth year]   Condition Code 44 given:  Yes Patient signature on Condition Code 44 notice:  Yes Documentation of 2 MD's agreement:  Yes Code 44 added to claim:  Yes    Pollie Friar, RN 09/15/2016, 5:06 PM

## 2016-09-15 NOTE — Plan of Care (Signed)
Problem: Pain Management: Goal: Pain level will decrease Outcome: Progressing Patient pain managed with PRN oxycodone; patient satisfied. Will continue to monitor.

## 2016-09-16 NOTE — Progress Notes (Signed)
Pt discharged at this time with wife taking all personal belongings. Surgical dressing site clean and dry. Suture removed per Md verbal with dry dressing applied. IV discontinued. Discharge instructions provided with prescriptions with verbal understanding. No noted distress. Pt will follow up with MD

## 2016-09-16 NOTE — Progress Notes (Signed)
JP drain came loose, minimal drainage at insertion site.

## 2016-09-16 NOTE — Care Management Note (Signed)
Case Management Note  Patient Details  Name: LELAN CUSH MRN: [id number] Date of Birth: [birth year]  Subjective/Objective:   Decompression L3-L5, fusion L-4-L5                 Action/Plan: Discharge Planning: NCM spoke to pt and wife at bedside. PT/OT did not recommend HH. Pt is requesting RW. Contacted [facility] rep for RW for home. RW delivered to room.  PCP Timmothy Euler   Expected Discharge Date:  09/16/16               Expected Discharge Plan:  Home/Self Care  In-House Referral:  NA  Discharge planning Services  CM Consult  Post Acute Care Choice:  NA Choice offered to:  NA  DME Arranged:  Walker rolling DME Agency:  [place]:  NA [place] Agency:  NA  Status of Service:  Completed, signed off  If discussed at [place] of Stay Meetings, dates discussed:    Additional Comments:  Erenest Rasher, RN 09/16/2016, 10:42 AM

## 2016-09-16 NOTE — Progress Notes (Signed)
Physical Therapy Treatment Patient Details Name: Daniel Short MRN: [id number] DOB: October 13, 1945 Today's Date: 09/16/2016    History of Present Illness L3-L5 posterior decompression; L4-5 fusion. See chart for  PMH    PT Comments    Pt continues to progress towards goals. Increased ambulation distance this session with improved technique. Continues to require safety cues to maintain precautions during mobility. Pts RW delivered to room; adjusted to appropriate height during session. Current recommendations appropriate. Will continue to follow acutely to continue progressing mobility.    Follow Up Recommendations  No PT follow up;Supervision for mobility/OOB     Equipment Recommendations  None recommended by PT    Recommendations for Other Services       Precautions / Restrictions Precautions Precautions: Back Precaution Booklet Issued: Yes (comment) Precaution Comments: Reviewed back precautions. Pt able to recall 1/3 and required cues for not lifting and twisting precautions Required Braces or Orthoses: Spinal Brace Spinal Brace: Lumbar corset;Applied in sitting position Restrictions Weight Bearing Restrictions: No    Mobility  Bed Mobility               General bed mobility comments: Sitting in chair upon entry   Transfers Overall transfer level: Needs assistance Equipment used: Rolling walker (2 wheeled) Transfers: Sit to/from Stand Sit to Stand: Min guard         General transfer comment: vc for hand placement for ascend/descend  Ambulation/Gait Ambulation/Gait assistance: Supervision Ambulation Distance (Feet): 225 Feet Assistive device: Rolling walker (2 wheeled) Gait Pattern/deviations: Step-through pattern;Decreased step length - right;Decreased step length - left;Trunk flexed;Antalgic;Shuffle Gait velocity: slowed Gait velocity interpretation: Below normal speed for age/gender General Gait Details: Slow, steady gait. Verbal cues throughout for  upright posture and maintenance of precautions throughout gait. Especially no twisting.    Stairs            Wheelchair Mobility    Modified Rankin (Stroke Patients Only)       Balance Overall balance assessment: No apparent balance deficits (not formally assessed)                                          Cognition Arousal/Alertness: Awake/alert Behavior During Therapy: WFL for tasks assessed/performed Overall Cognitive Status: Within Functional Limits for tasks assessed                                 General Comments: slow processing/decreased STM. pt states he didn't sleep last night. Son states his cognition is normal      Exercises      General Comments General comments (skin integrity, edema, etc.): Reviewed car transfer technique with pt. Also reviewed generalized walking program to perform at home. Asking questions about when to wear brace, and education provided about that as well.       Pertinent Vitals/Pain Pain Assessment: Faces Faces Pain Scale: Hurts even more Pain Location: back Pain Descriptors / Indicators: Aching Pain Intervention(s): Monitored during session;Limited activity within patient's tolerance;Repositioned    Home Living                      Prior Function            PT Goals (current goals can now be found in the care plan section) Acute Rehab PT Goals Patient Stated Goal: to go  home today PT Goal Formulation: With patient Time For Goal Achievement: 09/22/16 Potential to Achieve Goals: Good Progress towards PT goals: Progressing toward goals    Frequency    Min 5X/week      PT Plan Current plan remains appropriate    Co-evaluation              AM-PAC PT "6 Clicks" Daily Activity  Outcome Measure  Difficulty turning over in bed (including adjusting bedclothes, sheets and blankets)?: None Difficulty moving from lying on back to sitting on the side of the bed? :  None Difficulty sitting down on and standing up from a chair with arms (e.g., wheelchair, bedside commode, etc,.)?: None Help needed moving to and from a bed to chair (including a wheelchair)?: None Help needed walking in hospital room?: None Help needed climbing 3-5 steps with a railing? : A Little 6 Click Score: 23    End of Session Equipment Utilized During Treatment: Gait belt;Back brace Activity Tolerance: Patient tolerated treatment well Patient left: in chair;with call bell/phone within reach;with family/visitor present Nurse Communication: Mobility status;Patient requests pain meds PT Visit Diagnosis: Other abnormalities of gait and mobility (R26.89);Pain;Muscle weakness (generalized) (M62.81) Pain - part of body:  (back )     Time: 0962-8366 PT Time Calculation (min) (ACUTE ONLY): 15 min  Charges:  $Gait Training: 8-22 mins                    G Codes:  Functional Assessment Tool Used: AM-PAC 6 Clicks Basic Mobility;Clinical judgement Functional Limitation: Mobility: Walking and moving around Mobility: Walking and Moving Around Current Status ([id number]): At least 1 percent but less than 20 percent impaired, limited or restricted Mobility: Walking and Moving Around Goal Status [phone]): At least 1 percent but less than 20 percent impaired, limited or restricted   Leighton Ruff, PT, DPT  Acute Rehabilitation Services  Pager: [place] 09/16/2016, 1:00 PM

## 2016-09-16 NOTE — Progress Notes (Signed)
Pt sitting up in chair watching television. Surgical dressing dry and intact. Pt ambulates in the hall with brace on and aligned. Gait steady.  No noted distress.

## 2016-09-16 NOTE — Progress Notes (Signed)
    Subjective: Procedure(s) (LRB): Decompression L3-5, insitu fusion L4-5 (N/A) 2 Days Post-Op  Patient reports pain as 1 on 0-10 scale.  Reports decreased leg pain reports incisional back pain   Positive void Positive bowel movement Positive flatus Negative chest pain or shortness of breath  Objective: Vital signs in last 24 hours: Temp:  [97.7 F (36.5 C)-98.8 F (37.1 C)] 98.3 F (36.8 C) (06/22 0514) Pulse Rate:  [57-98] 76 (06/22 0514) Resp:  [16-20] 20 (06/22 0514) BP: (99-121)/(48-74) 100/48 (06/22 0514) SpO2:  [93 %-96 %] 94 % (06/22 0514)  Intake/Output from previous day: 06/21 0701 - 06/22 0700 In: 949.8 [P.O.:240; I.V.:709.8] Out: 125 [Drains:125]  Labs:  Recent Labs  09/14/16 1624  HCT 41.0    Recent Labs  09/14/16 1624  NA 139  K 3.8  GLUCOSE 122*   No results for input(s): LABPT, INR in the last 72 hours.  Physical Exam: Neurologically intact ABD soft Intact pulses distally Incision: dressing C/D/I and no drainage Compartment soft  Assessment/Plan: Patient stable  xrays n/a Continue mobilization with physical therapy Continue care  Advance diet Up with therapy  Doing well Plan on d/c to home today  Melina Schools, MD [place] [phone]

## 2016-09-17 ENCOUNTER — Inpatient Hospital Stay (HOSPITAL_COMMUNITY)
Admission: EM | Admit: 2016-09-17 | Discharge: 2016-09-20 | DRG: 519 | Disposition: A | Discharge status: Home / Self Care | Payer: Medicare HMO | Attending: Orthopedic Surgery | Admitting: Orthopedic Surgery

## 2016-09-17 ENCOUNTER — Emergency Department (HOSPITAL_COMMUNITY): Payer: Medicare HMO

## 2016-09-17 ENCOUNTER — Emergency Department (HOSPITAL_BASED_OUTPATIENT_CLINIC_OR_DEPARTMENT_OTHER)
Admit: 2016-09-17 | Discharge: 2016-09-17 | Disposition: A | Discharge status: Home / Self Care | Payer: Medicare HMO | Attending: Orthopedic Surgery | Admitting: Orthopedic Surgery

## 2016-09-17 ENCOUNTER — Encounter (HOSPITAL_COMMUNITY)
Admission: EM | Disposition: A | Discharge status: Home / Self Care | Payer: Self-pay | Source: Home / Self Care | Attending: Orthopedic Surgery

## 2016-09-17 ENCOUNTER — Emergency Department (HOSPITAL_COMMUNITY): Payer: Medicare HMO | Admitting: Anesthesiology

## 2016-09-17 ENCOUNTER — Encounter (HOSPITAL_COMMUNITY): Payer: Self-pay | Admitting: Emergency Medicine

## 2016-09-17 DIAGNOSIS — M109 Gout, unspecified: Secondary | ICD-10-CM | POA: Diagnosis present

## 2016-09-17 DIAGNOSIS — G9763 Postprocedural seroma of a nervous system organ or structure following a nervous system procedure: Secondary | ICD-10-CM | POA: Diagnosis present

## 2016-09-17 DIAGNOSIS — I2510 Atherosclerotic heart disease of native coronary artery without angina pectoris: Secondary | ICD-10-CM | POA: Diagnosis present

## 2016-09-17 DIAGNOSIS — Y839 Surgical procedure, unspecified as the cause of abnormal reaction of the patient, or of later complication, without mention of misadventure at the time of the procedure: Secondary | ICD-10-CM | POA: Diagnosis present

## 2016-09-17 DIAGNOSIS — M549 Dorsalgia, unspecified: Secondary | ICD-10-CM | POA: Diagnosis present

## 2016-09-17 DIAGNOSIS — I10 Essential (primary) hypertension: Secondary | ICD-10-CM | POA: Diagnosis not present

## 2016-09-17 DIAGNOSIS — M25569 Pain in unspecified knee: Secondary | ICD-10-CM

## 2016-09-17 DIAGNOSIS — Z8673 Personal history of transient ischemic attack (TIA), and cerebral infarction without residual deficits: Secondary | ICD-10-CM

## 2016-09-17 DIAGNOSIS — Z6835 Body mass index (BMI) 35.0-35.9, adult: Secondary | ICD-10-CM | POA: Diagnosis not present

## 2016-09-17 DIAGNOSIS — M48061 Spinal stenosis, lumbar region without neurogenic claudication: Secondary | ICD-10-CM | POA: Diagnosis present

## 2016-09-17 DIAGNOSIS — Z836 Family history of other diseases of the respiratory system: Secondary | ICD-10-CM | POA: Diagnosis not present

## 2016-09-17 DIAGNOSIS — M5126 Other intervertebral disc displacement, lumbar region: Secondary | ICD-10-CM

## 2016-09-17 DIAGNOSIS — M545 Low back pain, unspecified: Secondary | ICD-10-CM

## 2016-09-17 DIAGNOSIS — Z955 Presence of coronary angioplasty implant and graft: Secondary | ICD-10-CM

## 2016-09-17 DIAGNOSIS — Z9889 Other specified postprocedural states: Secondary | ICD-10-CM

## 2016-09-17 DIAGNOSIS — M5116 Intervertebral disc disorders with radiculopathy, lumbar region: Principal | ICD-10-CM | POA: Diagnosis present

## 2016-09-17 DIAGNOSIS — G9761 Postprocedural hematoma of a nervous system organ or structure following a nervous system procedure: Secondary | ICD-10-CM | POA: Diagnosis present

## 2016-09-17 DIAGNOSIS — M48062 Spinal stenosis, lumbar region with neurogenic claudication: Secondary | ICD-10-CM

## 2016-09-17 DIAGNOSIS — M4316 Spondylolisthesis, lumbar region: Secondary | ICD-10-CM | POA: Diagnosis present

## 2016-09-17 DIAGNOSIS — F17210 Nicotine dependence, cigarettes, uncomplicated: Secondary | ICD-10-CM | POA: Diagnosis present

## 2016-09-17 DIAGNOSIS — Z79899 Other long term (current) drug therapy: Secondary | ICD-10-CM

## 2016-09-17 DIAGNOSIS — G9782 Other postprocedural complications and disorders of nervous system: Secondary | ICD-10-CM

## 2016-09-17 DIAGNOSIS — Z8249 Family history of ischemic heart disease and other diseases of the circulatory system: Secondary | ICD-10-CM | POA: Diagnosis not present

## 2016-09-17 DIAGNOSIS — Z8679 Personal history of other diseases of the circulatory system: Secondary | ICD-10-CM

## 2016-09-17 DIAGNOSIS — T148XXA Other injury of unspecified body region, initial encounter: Secondary | ICD-10-CM | POA: Diagnosis present

## 2016-09-17 DIAGNOSIS — Z7982 Long term (current) use of aspirin: Secondary | ICD-10-CM | POA: Diagnosis not present

## 2016-09-17 DIAGNOSIS — I252 Old myocardial infarction: Secondary | ICD-10-CM

## 2016-09-17 DIAGNOSIS — F17200 Nicotine dependence, unspecified, uncomplicated: Secondary | ICD-10-CM | POA: Diagnosis present

## 2016-09-17 DIAGNOSIS — E669 Obesity, unspecified: Secondary | ICD-10-CM | POA: Diagnosis present

## 2016-09-17 DIAGNOSIS — E785 Hyperlipidemia, unspecified: Secondary | ICD-10-CM | POA: Diagnosis present

## 2016-09-17 DIAGNOSIS — M1990 Unspecified osteoarthritis, unspecified site: Secondary | ICD-10-CM | POA: Diagnosis present

## 2016-09-17 HISTORY — PX: LUMBAR LAMINECTOMY/DECOMPRESSION MICRODISCECTOMY: SHX5026

## 2016-09-17 LAB — BASIC METABOLIC PANEL
Anion gap: 8 (ref 5–15)
BUN: 14 mg/dL (ref 6–20)
CO2: 30 mmol/L (ref 22–32)
CREATININE: 0.94 mg/dL (ref 0.61–1.24)
Calcium: 8.8 mg/dL — ABNORMAL LOW (ref 8.9–10.3)
Chloride: 100 mmol/L — ABNORMAL LOW (ref 101–111)
GFR calc Af Amer: >60 mL/min (ref >60)
GLUCOSE: 113 mg/dL — AB (ref 65–99)
POTASSIUM: 3.5 mmol/L (ref 3.5–5.1)
Sodium: 138 mmol/L (ref 135–145)

## 2016-09-17 LAB — CBC WITH DIFFERENTIAL/PLATELET
Basophils Absolute: 0 10*3/uL (ref 0.0–0.1)
Basophils Relative: 0 %
EOS ABS: 0.1 10*3/uL (ref 0.0–0.7)
EOS PCT: 1 %
HCT: 36 % — ABNORMAL LOW (ref 39.0–52.0)
Hemoglobin: 11.9 g/dL — ABNORMAL LOW (ref 13.0–17.0)
LYMPHS ABS: 3.7 10*3/uL (ref 0.7–4.0)
LYMPHS PCT: 29 %
MCH: 30.2 pg (ref 26.0–34.0)
MCHC: 33.1 g/dL (ref 30.0–36.0)
MCV: 91.4 fL (ref 78.0–100.0)
MONO ABS: 1.5 10*3/uL — AB (ref 0.1–1.0)
MONOS PCT: 12 %
Neutro Abs: 7.4 10*3/uL (ref 1.7–7.7)
Neutrophils Relative %: 58 %
PLATELETS: 173 10*3/uL (ref 150–400)
RBC: 3.94 MIL/uL — ABNORMAL LOW (ref 4.22–5.81)
RDW: 12.3 % (ref 11.5–15.5)
WBC: 12.6 10*3/uL — AB (ref 4.0–10.5)

## 2016-09-17 LAB — SYNOVIAL CELL COUNT + DIFF, W/ CRYSTALS
Crystals, Fluid: NONE SEEN
Lymphocytes-Synovial Fld: 60 % — ABNORMAL HIGH (ref 0–20)
Monocyte-Macrophage-Synovial Fluid: 4 % — ABNORMAL LOW (ref 50–90)
NEUTROPHIL, SYNOVIAL: 35 % — AB (ref 0–25)
WBC, SYNOVIAL: 32 /mm3 (ref 0–200)

## 2016-09-17 LAB — GRAM STAIN: SPECIAL REQUESTS: NORMAL

## 2016-09-17 SURGERY — LUMBAR LAMINECTOMY/DECOMPRESSION MICRODISCECTOMY
Anesthesia: General

## 2016-09-17 MED ORDER — SODIUM CHLORIDE 0.9% FLUSH: 3.0000 mL | INTRAVENOUS | Status: DC | PRN

## 2016-09-17 MED ORDER — ONDANSETRON HCL 4 MG/2ML IJ SOLN: 4.0000 mg | Freq: Four times a day (QID) | INTRAMUSCULAR | Status: DC | PRN

## 2016-09-17 MED ORDER — ONDANSETRON HCL 4 MG/2ML IJ SOLN
4.0000 mg | Freq: Once | INTRAMUSCULAR | Status: AC
  Administered 2016-09-17: 4 mg via INTRAVENOUS
  Filled 2016-09-17: qty 2

## 2016-09-17 MED ORDER — HEMOSTATIC AGENTS (NO CHARGE) OPTIME
TOPICAL | Status: DC | PRN
  Administered 2016-09-17: 1 via TOPICAL

## 2016-09-17 MED ORDER — METHYLPREDNISOLONE ACETATE 40 MG/ML IJ SUSP
INTRAMUSCULAR | Status: DC | PRN
  Administered 2016-09-17: 40 mg

## 2016-09-17 MED ORDER — HYDROMORPHONE HCL 1 MG/ML IJ SOLN: 0.2500 mg | INTRAMUSCULAR | Status: DC | PRN

## 2016-09-17 MED ORDER — METHYLPREDNISOLONE ACETATE 80 MG/ML IJ SUSP
INTRAMUSCULAR | Status: AC
  Filled 2016-09-17: qty 1

## 2016-09-17 MED ORDER — LACTATED RINGERS IV SOLN
INTRAVENOUS | Status: DC | PRN
  Administered 2016-09-17 (×2): via INTRAVENOUS

## 2016-09-17 MED ORDER — THROMBIN 20000 UNITS EX SOLR
CUTANEOUS | Status: DC | PRN
  Administered 2016-09-17 (×2): 20000 [IU] via TOPICAL

## 2016-09-17 MED ORDER — METHOCARBAMOL 1000 MG/10ML IJ SOLN
500.0000 mg | Freq: Four times a day (QID) | INTRAVENOUS | Status: DC | PRN
  Filled 2016-09-17: qty 5

## 2016-09-17 MED ORDER — MORPHINE SULFATE (PF) 2 MG/ML IV SOLN: 2.0000 mg | INTRAVENOUS | Status: AC | PRN

## 2016-09-17 MED ORDER — SODIUM CHLORIDE 0.9 % IV SOLN
250.0000 mL | INTRAVENOUS | Status: DC
  Administered 2016-09-18: 250 mL via INTRAVENOUS

## 2016-09-17 MED ORDER — FENTANYL CITRATE (PF) 250 MCG/5ML IJ SOLN
INTRAMUSCULAR | Status: AC
  Filled 2016-09-17: qty 5

## 2016-09-17 MED ORDER — HYDROMORPHONE HCL 1 MG/ML IJ SOLN
1.0000 mg | Freq: Once | INTRAMUSCULAR | Status: AC
  Administered 2016-09-17: 1 mg via INTRAVENOUS
  Filled 2016-09-17: qty 1

## 2016-09-17 MED ORDER — ACETAMINOPHEN 650 MG RE SUPP: 650.0000 mg | RECTAL | Status: DC | PRN

## 2016-09-17 MED ORDER — SUGAMMADEX SODIUM 200 MG/2ML IV SOLN
INTRAVENOUS | Status: AC
  Filled 2016-09-17: qty 2

## 2016-09-17 MED ORDER — POLYETHYLENE GLYCOL 3350 17 G PO PACK
17.0000 g | PACK | Freq: Every day | ORAL | Status: DC | PRN
  Administered 2016-09-18: 17 g via ORAL
  Filled 2016-09-17: qty 1

## 2016-09-17 MED ORDER — BUPIVACAINE-EPINEPHRINE (PF) 0.25% -1:200000 IJ SOLN
INTRAMUSCULAR | Status: AC
  Filled 2016-09-17: qty 30

## 2016-09-17 MED ORDER — THROMBIN 20000 UNITS EX KIT
PACK | CUTANEOUS | Status: AC
  Filled 2016-09-17: qty 1

## 2016-09-17 MED ORDER — DEXAMETHASONE SODIUM PHOSPHATE 10 MG/ML IJ SOLN
INTRAMUSCULAR | Status: AC
  Filled 2016-09-17: qty 1

## 2016-09-17 MED ORDER — 0.9 % SODIUM CHLORIDE (POUR BTL) OPTIME
TOPICAL | Status: DC | PRN
  Administered 2016-09-17 (×2): 1000 mL

## 2016-09-17 MED ORDER — FENTANYL CITRATE (PF) 100 MCG/2ML IJ SOLN
INTRAMUSCULAR | Status: DC | PRN
  Administered 2016-09-17 (×2): 50 ug via INTRAVENOUS

## 2016-09-17 MED ORDER — METHOCARBAMOL 1000 MG/10ML IJ SOLN
500.0000 mg | Freq: Once | INTRAVENOUS | Status: AC
  Administered 2016-09-17: 500 mg via INTRAVENOUS
  Filled 2016-09-17: qty 5

## 2016-09-17 MED ORDER — DEXTROSE 5 % IV SOLN
INTRAVENOUS | Status: DC | PRN
  Administered 2016-09-17: 25 ug/min via INTRAVENOUS

## 2016-09-17 MED ORDER — ONDANSETRON HCL 4 MG PO TABS: 4.0000 mg | ORAL_TABLET | Freq: Four times a day (QID) | ORAL | Status: DC | PRN

## 2016-09-17 MED ORDER — HYDROMORPHONE HCL 1 MG/ML IJ SOLN
0.5000 mg | Freq: Once | INTRAMUSCULAR | Status: AC
  Administered 2016-09-17: 0.5 mg via INTRAVENOUS
  Filled 2016-09-17: qty 1

## 2016-09-17 MED ORDER — ONDANSETRON HCL 4 MG/2ML IJ SOLN
INTRAMUSCULAR | Status: DC | PRN
  Administered 2016-09-17: 4 mg via INTRAVENOUS

## 2016-09-17 MED ORDER — MIDAZOLAM HCL 2 MG/2ML IJ SOLN
INTRAMUSCULAR | Status: AC
  Filled 2016-09-17: qty 2

## 2016-09-17 MED ORDER — SODIUM CHLORIDE 0.9% FLUSH
3.0000 mL | Freq: Two times a day (BID) | INTRAVENOUS | Status: DC
  Administered 2016-09-18 – 2016-09-20 (×5): 3 mL via INTRAVENOUS

## 2016-09-17 MED ORDER — MIDAZOLAM HCL 5 MG/5ML IJ SOLN
INTRAMUSCULAR | Status: DC | PRN
  Administered 2016-09-17: 1 mg via INTRAVENOUS

## 2016-09-17 MED ORDER — CEFAZOLIN SODIUM-DEXTROSE 2-4 GM/100ML-% IV SOLN
2.0000 g | Freq: Three times a day (TID) | INTRAVENOUS | Status: AC
  Administered 2016-09-17 – 2016-09-18 (×2): 2 g via INTRAVENOUS
  Filled 2016-09-17 (×2): qty 100

## 2016-09-17 MED ORDER — OXYCODONE HCL 5 MG PO TABS
10.0000 mg | ORAL_TABLET | ORAL | Status: DC | PRN
  Administered 2016-09-17 – 2016-09-20 (×11): 10 mg via ORAL
  Filled 2016-09-17 (×11): qty 2

## 2016-09-17 MED ORDER — PHENOL 1.4 % MT LIQD: 1.0000 | OROMUCOSAL | Status: DC | PRN

## 2016-09-17 MED ORDER — METHYLPREDNISOLONE ACETATE 40 MG/ML IJ SUSP
20.0000 mg | Freq: Once | INTRAMUSCULAR | Status: AC
  Administered 2016-09-17: 20 mg via INTRA_ARTICULAR
  Filled 2016-09-17: qty 1

## 2016-09-17 MED ORDER — GABAPENTIN 600 MG PO TABS
600.0000 mg | ORAL_TABLET | Freq: Once | ORAL | Status: AC
  Administered 2016-09-17: 600 mg via ORAL
  Filled 2016-09-17: qty 1

## 2016-09-17 MED ORDER — HYDROMORPHONE HCL 1 MG/ML IJ SOLN: 1.0000 mg | Freq: Once | INTRAMUSCULAR | Status: DC

## 2016-09-17 MED ORDER — BUPIVACAINE HCL (PF) 0.5 % IJ SOLN
5.0000 mL | Freq: Once | INTRAMUSCULAR | Status: AC
  Administered 2016-09-17: 5 mL
  Filled 2016-09-17: qty 10

## 2016-09-17 MED ORDER — SUGAMMADEX SODIUM 200 MG/2ML IV SOLN
INTRAVENOUS | Status: DC | PRN
  Administered 2016-09-17: 200 mg via INTRAVENOUS

## 2016-09-17 MED ORDER — ROCURONIUM BROMIDE 100 MG/10ML IV SOLN
INTRAVENOUS | Status: DC | PRN
  Administered 2016-09-17: 20 mg via INTRAVENOUS
  Administered 2016-09-17: 50 mg via INTRAVENOUS

## 2016-09-17 MED ORDER — MENTHOL 3 MG MT LOZG: 1.0000 | LOZENGE | OROMUCOSAL | Status: DC | PRN

## 2016-09-17 MED ORDER — LACTATED RINGERS IV SOLN: INTRAVENOUS | Status: DC

## 2016-09-17 MED ORDER — CEFAZOLIN SODIUM-DEXTROSE 2-4 GM/100ML-% IV SOLN
2.0000 g | Freq: Once | INTRAVENOUS | Status: AC
  Administered 2016-09-17: 2 g via INTRAVENOUS
  Filled 2016-09-17: qty 100

## 2016-09-17 MED ORDER — PROPOFOL 10 MG/ML IV BOLUS
INTRAVENOUS | Status: DC | PRN
  Administered 2016-09-17: 100 mg via INTRAVENOUS

## 2016-09-17 MED ORDER — METHYLPREDNISOLONE ACETATE 40 MG/ML IJ SUSP
INTRAMUSCULAR | Status: AC
  Filled 2016-09-17: qty 1

## 2016-09-17 MED ORDER — CEFAZOLIN SODIUM-DEXTROSE 2-3 GM-% IV SOLR
INTRAVENOUS | Status: DC | PRN
  Administered 2016-09-17: 2 g via INTRAVENOUS

## 2016-09-17 MED ORDER — LIDOCAINE HCL (CARDIAC) 20 MG/ML IV SOLN
INTRAVENOUS | Status: DC | PRN
Start: 2016-09-17 — End: 2016-09-17
  Administered 2016-09-17: 50 mg via INTRAVENOUS

## 2016-09-17 MED ORDER — ONDANSETRON HCL 4 MG/2ML IJ SOLN
INTRAMUSCULAR | Status: AC
  Filled 2016-09-17: qty 2

## 2016-09-17 MED ORDER — DEXAMETHASONE SODIUM PHOSPHATE 10 MG/ML IJ SOLN
10.0000 mg | Freq: Once | INTRAMUSCULAR | Status: AC
  Administered 2016-09-17: 10 mg via INTRAVENOUS
  Filled 2016-09-17: qty 1

## 2016-09-17 MED ORDER — ACETAMINOPHEN 325 MG PO TABS: 650.0000 mg | ORAL_TABLET | ORAL | Status: DC | PRN

## 2016-09-17 MED ORDER — PHENYLEPHRINE HCL 10 MG/ML IJ SOLN
INTRAMUSCULAR | Status: AC
  Filled 2016-09-17: qty 1

## 2016-09-17 MED ORDER — METHOCARBAMOL 500 MG PO TABS
500.0000 mg | ORAL_TABLET | Freq: Four times a day (QID) | ORAL | Status: DC | PRN
  Administered 2016-09-17 – 2016-09-19 (×4): 500 mg via ORAL
  Filled 2016-09-17 (×4): qty 1

## 2016-09-17 MED ORDER — GADOBENATE DIMEGLUMINE 529 MG/ML IV SOLN
20.0000 mL | Freq: Once | INTRAVENOUS | Status: AC | PRN
  Administered 2016-09-17: 20 mL via INTRAVENOUS

## 2016-09-17 MED ORDER — DEXAMETHASONE SODIUM PHOSPHATE 10 MG/ML IJ SOLN
INTRAMUSCULAR | Status: DC | PRN
  Administered 2016-09-17: 10 mg via INTRAVENOUS

## 2016-09-17 SURGICAL SUPPLY — 59 items
BNDG GAUZE ELAST 4 BULKY (GAUZE/BANDAGES/DRESSINGS) ×1 IMPLANT
CANISTER SUCT 3000ML PPV (MISCELLANEOUS) ×3 IMPLANT
CLOSURE STERI-STRIP 1/2X4 (GAUZE/BANDAGES/DRESSINGS) ×1
CLOSURE WOUND 1/2 X4 (GAUZE/BANDAGES/DRESSINGS) ×1
CLSR STERI-STRIP ANTIMIC 1/2X4 (GAUZE/BANDAGES/DRESSINGS) ×2 IMPLANT
COVER SURGICAL LIGHT HANDLE (MISCELLANEOUS) ×5 IMPLANT
DRAIN CHANNEL 15F RND FF W/TCR (WOUND CARE) ×2 IMPLANT
DRAPE SURG 17X23 STRL (DRAPES) ×5 IMPLANT
DRAPE U-SHAPE 47X51 STRL (DRAPES) ×3 IMPLANT
DRSG ADAPTIC 3X8 NADH LF (GAUZE/BANDAGES/DRESSINGS) ×2 IMPLANT
DRSG AQUACEL AG ADV 3.5X 6 (GAUZE/BANDAGES/DRESSINGS) ×3 IMPLANT
DRSG OPSITE POSTOP 4X10 (GAUZE/BANDAGES/DRESSINGS) ×2 IMPLANT
DURAPREP 26ML APPLICATOR (WOUND CARE) ×3 IMPLANT
ELECT BLADE 4.0 EZ CLEAN MEGAD (MISCELLANEOUS) ×3
ELECT PENCIL ROCKER SW 15FT (MISCELLANEOUS) ×1 IMPLANT
ELECT REM PT RETURN 9FT ADLT (ELECTROSURGICAL) ×3
ELECTRODE BLDE 4.0 EZ CLN MEGD (MISCELLANEOUS) ×1 IMPLANT
ELECTRODE REM PT RTRN 9FT ADLT (ELECTROSURGICAL) ×1 IMPLANT
EVACUATOR SILICONE 100CC (DRAIN) ×2 IMPLANT
GLOVE BIO SURGEON STRL SZ 6.5 (GLOVE) ×1 IMPLANT
GLOVE BIO SURGEONS STRL SZ 6.5 (GLOVE)
GLOVE BIOGEL PI IND STRL 6.5 (GLOVE) ×1 IMPLANT
GLOVE BIOGEL PI IND STRL 8.5 (GLOVE) ×1 IMPLANT
GLOVE BIOGEL PI INDICATOR 6.5 (GLOVE) ×4
GLOVE BIOGEL PI INDICATOR 8.5 (GLOVE) ×2
GLOVE SS BIOGEL STRL SZ 8.5 (GLOVE) ×1 IMPLANT
GLOVE SUPERSENSE BIOGEL SZ 8.5 (GLOVE) ×2
GOWN STRL REUS W/ TWL XL LVL3 (GOWN DISPOSABLE) ×2 IMPLANT
GOWN STRL REUS W/TWL 2XL LVL3 (GOWN DISPOSABLE) ×3 IMPLANT
GOWN STRL REUS W/TWL XL LVL3 (GOWN DISPOSABLE) ×3
KIT BASIN OR (CUSTOM PROCEDURE TRAY) ×3 IMPLANT
KIT ROOM TURNOVER OR (KITS) ×3 IMPLANT
NDL SPNL 18GX3.5 QUINCKE PK (NEEDLE) ×2 IMPLANT
NEEDLE 22X1 1/2 (OR ONLY) (NEEDLE) ×3 IMPLANT
NEEDLE SPNL 18GX3.5 QUINCKE PK (NEEDLE) ×6 IMPLANT
NS IRRIG 1000ML POUR BTL (IV SOLUTION) ×3 IMPLANT
PACK LAMINECTOMY ORTHO (CUSTOM PROCEDURE TRAY) ×3 IMPLANT
PACK UNIVERSAL I (CUSTOM PROCEDURE TRAY) ×3 IMPLANT
PAD ARMBOARD 7.5X6 YLW CONV (MISCELLANEOUS) ×8 IMPLANT
PATTIES SURGICAL .5 X.5 (GAUZE/BANDAGES/DRESSINGS) IMPLANT
PATTIES SURGICAL .5 X1 (DISPOSABLE) ×3 IMPLANT
SPONGE SURGIFOAM ABS GEL 100 (HEMOSTASIS) ×3 IMPLANT
STRIP CLOSURE SKIN 1/2X4 (GAUZE/BANDAGES/DRESSINGS) ×1 IMPLANT
SURGIFLO W/THROMBIN 8M KIT (HEMOSTASIS) ×3 IMPLANT
SUT BONE WAX W31G (SUTURE) ×3 IMPLANT
SUT MON AB 3-0 SH 27 (SUTURE) ×3
SUT MON AB 3-0 SH27 (SUTURE) ×1 IMPLANT
SUT PROLENE 2 0 FS (SUTURE) ×4 IMPLANT
SUT SILK 2 0 FS (SUTURE) ×2 IMPLANT
SUT VIC AB 1 CT1 18XCR BRD 8 (SUTURE) IMPLANT
SUT VIC AB 1 CT1 27 (SUTURE) ×3
SUT VIC AB 1 CT1 27XBRD ANBCTR (SUTURE) ×1 IMPLANT
SUT VIC AB 1 CT1 8-18 (SUTURE) ×3
SUT VIC AB 2-0 CT1 18 (SUTURE) ×5 IMPLANT
SYR CONTROL 10ML LL (SYRINGE) ×3 IMPLANT
TOWEL OR 17X24 6PK STRL BLUE (TOWEL DISPOSABLE) ×3 IMPLANT
TOWEL OR 17X26 10 PK STRL BLUE (TOWEL DISPOSABLE) ×3 IMPLANT
WATER STERILE IRR 1000ML POUR (IV SOLUTION) ×3 IMPLANT
YANKAUER SUCT BULB TIP NO VENT (SUCTIONS) ×3 IMPLANT

## 2016-09-17 NOTE — ED Triage Notes (Signed)
Patient present today with complaints of Lower back pain and bilateral knees. Patient reports pain started last night after discharge. Patient denies any injury. Patient able to move bilateral extremities.  Patient denies any sensation changes  Pedal pulse intact. Patient able to pull down and pull up with feet.

## 2016-09-17 NOTE — Anesthesia Postprocedure Evaluation (Signed)
Anesthesia Post Note  Patient: Daniel Short  Procedure(s) Performed: Procedure(s) (LRB): WOUND EXPLORATION, GILL DECOMPRESSION,FAR LATERAL DISCECTOMY LEFT L4-L5, AND EVACUATION OF HEMATOMA (N/A)     Patient location during evaluation: PACU Anesthesia Type: General Level of consciousness: awake and alert Pain management: pain level controlled Vital Signs Assessment: post-procedure vital signs reviewed and stable Respiratory status: spontaneous breathing, nonlabored ventilation, respiratory function stable and patient connected to nasal cannula oxygen Cardiovascular status: blood pressure returned to baseline and stable Postop Assessment: no signs of nausea or vomiting Anesthetic complications: no    Last Vitals:  Vitals:   09/17/16 2015 09/17/16 2041  BP: 140/70 137/79  Pulse:  71  Resp:  18  Temp:  37.2 C    Last Pain:  Vitals:   09/17/16 2041  TempSrc: Oral  PainSc:                  Damyra Luscher,W. EDMOND

## 2016-09-17 NOTE — Congregational Nurse Program (Addendum)
Daniel Euler, MD Chief Complaint: Significant increase and posterior left knee pain and back pain. History: Daniel Short is a very pleasant 71 year old gentleman who underwent a lumbar decompression and in situ fusion on Wednesday, 09/14/2016. Postoperatively the patient improved. On postoperative day 2 his drain was self DC'd but he was ambulating voiding spontaneously with improving back buttock and radicular leg pain. Patient ultimately was evaluated and cleared by physical therapy for home discharge. He was tolerating a regular diet, and was neurologically intact. His overall radicular pain was improving. He was discharged Friday morning 09/16/2016.  The patient called me this morning to indicate that he was having horrific increase and posterior left knee pain in his back pain was getting worse. As a result I advised him to come to the emergency room oracular evaluate him.  The patient arrived in to the emergency room and I was notified. An MRI had been ordered by the emergency room staff.  Past Medical History:  Diagnosis Date  . Arthritis   . CAD (coronary artery disease)    with stent placement  . Complication of anesthesia    wife states he woke up during heart stent,; with stroke he tried to put out IV'S  . Coronary artery arteriosclerosis    had stenting in past; not sure when  . CVA (cerebrovascular accident due to intracerebral hemorrhage) ([place])   . Dyspnea    when walking short distances  . Ear infection 1979   right ear due to wax build up , stayed in hospital several days  . Headache   . Heart attack [facility])    developed 3rd degree AVB, inferior ST elevation following 02/15/02 LHC showing 70% RCA--> s/p temporary PPM and proximal/ostial RCA Zeta stents   . Stroke [facility]) 2000   cva due to intracerebral hemorrhage    Allergies  Allergen Reactions  . No Known Allergies     No current facility-administered medications on file prior to encounter.    Current Outpatient  Prescriptions on File Prior to Encounter  Medication Sig Dispense Refill  . aspirin 325 MG tablet Take 325 mg by mouth daily.    Marland Kitchen atorvastatin (LIPITOR) 20 MG tablet Take 1 tablet (20 mg total) by mouth daily. 30 tablet 2  . hydrochlorothiazide (HYDRODIURIL) 25 MG tablet Take 1 tablet (25 mg total) by mouth daily. 90 tablet 3  . methocarbamol (ROBAXIN) 500 MG tablet Take 1 tablet (500 mg total) by mouth every 6 (six) hours as needed for muscle spasms. 30 tablet 0  . ondansetron (ZOFRAN ODT) 4 MG disintegrating tablet Take 1 tablet (4 mg total) by mouth every 8 (eight) hours as needed for nausea or vomiting. 20 tablet 0  . oxyCODONE 10 MG TABS Take 1 tablet (10 mg total) by mouth every 4 (four) hours as needed for severe pain. 42 tablet 0  . pregabalin (LYRICA) 75 MG capsule Take 1 capsule (75 mg total) by mouth 2 (two) times daily. 60 capsule 2    Physical Exam: Vitals:   09/17/16 0943 09/17/16 0945  BP: 128/72 125/68  Pulse: 78 76  Resp: 18   Temp: 97.9 F (36.6 C)   He is alert and oriented 3.  No shortness of breath, or chest pain.  Abdomen is soft nontender, no rebound tenderness.  Rectal exam: Normal sensation to light touch and pinprick in the perianal region. Digital rectal exam normal, normal rectal tone.  No shoulder or elbow wrist hip near ankle pain with isolated joint range of  motion except for the left knee. Examination of the left knee reveals posterior tenderness. No pulsatile mass. He has no significant pain with circumduction of the knee no gross instability with varus and valgus stress testing at 0 and 30. No palpable defect in the quad or patellar tendon.  Neurological exam.  Upper extremity. 5 out of 5 motor strength throughout. Sensation to light touch and pinprick is intact throughout. 1+ upper extremity deep tendon reflexes biceps, triceps, brachial radialis. Negative Hoffmann sign.  Lower extremity. 5 out of 5 motor strength in the quad, hamstring, tibialis  anterior, GA, EHL. He does elicit posterior knee pain with quad strength testing. But his extensor mechanism is intact. Sensation to light touch and pinprick is intact throughout the lower extremities. Negative Babinski test, 1+ deep tendon reflexes at the knee and Achilles. No clonus.  Lumbar spine. The incision is clean dry and intact dressings are intact. There is no significant swelling no erythema no drainage.  Compartments are soft and nontender. 2+ dorsalis pedis, posterior tibialis pulses bilaterally.   Image: Dg Chest 2 View  Result Date: 08/30/2016 CLINICAL DATA:  Cough, shortness of breath and weakness for 1 day, history smoking, coronary artery disease EXAM: CHEST  2 VIEW COMPARISON:  02/17/2002 FINDINGS: Enlargement of cardiac silhouette. Mediastinal contours and pulmonary vascularity normal. Lungs hyperinflated but clear. No pulmonary infiltrate, pleural effusion, or pneumothorax. Bones unremarkable. IMPRESSION: Enlargement of cardiac silhouette. Hyperinflation without acute infiltrate Electronically Signed   By: Lavonia Dana M.D.   On: 08/30/2016 16:57   Dg Lumbar Spine 2-3 Views  Result Date: 09/14/2016 CLINICAL DATA:  Intraoperative localization. EXAM: LUMBAR SPINE - 2-3 VIEW COMPARISON:  Lumbar spine radiographs May 19, 2016 and the lumbar spine localization September 23, 2006 at 1522 hours FINDINGS: Lateral radiograph for localization labeled "#1" at 1502 hours: Surgical instrument indicates the spinous process of L2 and L3. Lateral radiograph for localization labeled "#2" at 1705 hours: Surgical instrument indicates the posterior elements at L3-4 and L5-S1 on radiograph labeled #2 using the reference of last well-formed intervertebral disc space at L5-S1. IMPRESSION: Lateral radiograph for localization, surgical instruments span L3-4 through L5-S1 posterior elements. Findings called to the operating room and relayed to Dr. Rolena Infante on September 14, 2016 at 1725 hours. Electronically Signed    By: Elon Alas M.D.   On: 09/14/2016 17:30   Dg Lumbar Spine 1 View  Result Date: 09/14/2016 CLINICAL DATA:  Decompression L3-L5, fusion L4-5. EXAM: LUMBAR SPINE - 1 VIEW COMPARISON:  05/19/2016 FINDINGS: Posterior surgical instruments are directed at the L4-5 disc space. This numbering is different than on prior plain film. I discussed the case with Dr. Rolena Infante who agrees with my numbering as this is the same numbering scheme that was performed on outside MRI. IMPRESSION: Intraoperative localization as above. Electronically Signed   By: Rolm Baptise M.D.   On: 09/14/2016 15:31    A/P: At this point. Patient's neurological exam is essentially unremarkable. There is no clinical evidence to support a cauda equina syndrome at present. He does not have any significant true radicular leg pain. The bulk of his pain is located to the posterior aspect of the left knee. He does have a history of gout and this may represent a gout flare. Although his clinical exam does not demonstrate any focal neurological deficits I would like to get an MRI of his back to ensure that there is no significant abnormality. In addition I am going to aspirate the left knee to rule  out gout, as well as injected with Marcaine And prednisone to aid in relief of his severe pain.  I will also order a lower extremity Doppler exam to ensure that he does not have a deep venous thrombosis (DVT). Continue to monitor his clinical exam. If he is unable to ambulate and there are no significant findings on radiographic examination then we will admit for acute pain control and plan on discharge to a skilled nursing facility/rehabilitation center.    MRI results reviewed. Per the radiologist's question of CSF leak versus hematoma/seroma. Acute L4-5 far lateral disc herniation with significant left L4 neural compression.  At this point I do not think the patient has a CSF leak. Clinically he has no spinal headaches, and the wound  demonstrates no evidence of swelling, or drainage of clear fluid. However he does have a large fluid collection causing significant stenosis. While this may be CSF, my first thought is that it is a hematoma. Fortunately at this point time he does not have signs of a cauda equina syndrome. He is however complaining of severe left neuropathic leg pain. More than likely this is emanating from the L4 neural compression due to the new left foraminal and extraforaminal disc herniation. After discussing the MRI results with the patient and his family have elected to return to the OR today for washout and evacuation of fluid collection exploration for CSF leak and L4 left foraminal discectomy decompression. I discussed the risks with the patient which include infection, bleeding, nerve damage, death, stroke, paralysis, CSF leak, ongoing or worse pain, need for additional surgery, need for fusion surgery with instrumentation. All of the patient and his family's questions were addressed. Consent will be obtained and we'll plan on surgery this afternoon.  Postoperatively aliment patient and plan on ultimately transferring to skilled nursing facility/rehabilitation center instead of returning home. According to his wife she had great difficulty taking care of him.

## 2016-09-17 NOTE — Anesthesia Procedure Notes (Signed)
Procedure Name: Intubation Date/Time: 09/17/2016 4:43 PM Performed by: Rejeana Brock L Pre-anesthesia Checklist: Patient identified, Emergency Drugs available, Suction available and Patient being monitored Patient Re-evaluated:Patient Re-evaluated prior to inductionOxygen Delivery Method: Circle System Utilized Preoxygenation: Pre-oxygenation with 100% oxygen Intubation Type: IV induction Ventilation: Mask ventilation without difficulty Laryngoscope Size: Mac and 4 Grade View: Grade I Tube type: Oral Tube size: 7.5 mm Number of attempts: 1 Airway Equipment and Method: Stylet and Oral airway Placement Confirmation: ETT inserted through vocal cords under direct vision,  positive ETCO2 and breath sounds checked- equal and bilateral Secured at: 22 cm Tube secured with: Tape Dental Injury: Teeth and Oropharynx as per pre-operative assessment

## 2016-09-17 NOTE — Transfer of Care (Signed)
Immediate Anesthesia Transfer of Care Note  Patient: Daniel Short  Procedure(s) Performed: Procedure(s): WOUND EXPLORATION, GILL DECOMPRESSION,FAR LATERAL DISCECTOMY LEFT L4-L5, AND EVACUATION OF HEMATOMA (N/A)  Patient Location: PACU  Anesthesia Type:General  Level of Consciousness: awake  Airway & Oxygen Therapy: Patient Spontanous Breathing  Post-op Assessment: Report given to RN and Post -op Vital signs reviewed and stable  Post vital signs: Reviewed and stable  Last Vitals:  Vitals:   09/17/16 1922 09/17/16 1930  BP: (!) 145/78 (!) 152/83  Pulse: 76   Resp:    Temp: 36.3 C     Last Pain:  Vitals:   09/17/16 1922  TempSrc:   PainSc: 3          Complications: No apparent anesthesia complications

## 2016-09-17 NOTE — Brief Op Note (Signed)
09/17/2016  7:30 PM  PATIENT:  [name]  71 y.o. male  PRE-OPERATIVE DIAGNOSIS:  lumbar pain  POST-OPERATIVE DIAGNOSIS:  lumbar pain  PROCEDURE:  Procedure(s): WOUND EXPLORATION, GILL DECOMPRESSION,FAR LATERAL DISCECTOMY LEFT L4-L5, AND EVACUATION OF HEMATOMA (N/A)  SURGEON:  Surgeon(s) and Role:    Melina Schools, MD - Primary  PHYSICIAN ASSISTANT:   ASSISTANTS: none   ANESTHESIA:   general  EBL:  No intake/output data recorded.  BLOOD ADMINISTERED:none  DRAINS: back    LOCAL MEDICATIONS USED:  NONE  SPECIMEN:  No Specimen  DISPOSITION OF SPECIMEN:  N/A  COUNTS:  YES  TOURNIQUET:  * No tourniquets in log *  DICTATION: .Dragon Dictation  PLAN OF CARE: Admit to inpatient   PATIENT DISPOSITION:  PACU - hemodynamically stable.

## 2016-09-17 NOTE — Progress Notes (Signed)
Pt admitted to the unit from pacu; pt A&O x4; MAE x4; IV intact and transfusing; foley intact and unclamped; SCD's on; pt oriented to the unit and room; fall/safety precaution and prevention education completed; bed alarm on; VSS; pt skin clean, dry and intact except for back surgical incision with JP to gravity. Honeycomb and gauze dsg clean, dry and intact. Pt remains on 2 liters oxygen and call light within reach and family at bedside. Will closely monitor. Delia Heady RN

## 2016-09-17 NOTE — Anesthesia Preprocedure Evaluation (Addendum)
Anesthesia Evaluation  Patient identified by MRN, date of birth, ID band Patient awake    Reviewed: Allergy & Precautions, H&P , NPO status , Patient's Chart, lab work & pertinent test results  Airway Mallampati: III  TM Distance: >3 FB Neck ROM: Full    Dental no notable dental hx. (+) Edentulous Upper, Dental Advisory Given   Pulmonary Current Smoker,    Pulmonary exam normal breath sounds clear to auscultation       Cardiovascular hypertension, Pt. on medications + CAD, + Past MI and + Cardiac Stents   Rhythm:Regular Rate:Normal     Neuro/Psych  Headaches, CVA negative psych ROS   GI/Hepatic negative GI ROS, Neg liver ROS,   Endo/Other  negative endocrine ROS  Renal/GU negative Renal ROS  negative genitourinary   Musculoskeletal  (+) Arthritis , Osteoarthritis,    Abdominal   Peds  Hematology negative hematology ROS (+)   Anesthesia Other Findings   Reproductive/Obstetrics negative OB ROS                            Anesthesia Physical Anesthesia Plan  ASA: III and emergent  Anesthesia Plan: General   Post-op Pain Management:    Induction: Intravenous  PONV Risk Score and Plan: 2 and Ondansetron and Dexamethasone  Airway Management Planned: Oral ETT  Additional Equipment:   Intra-op Plan:   Post-operative Plan: Extubation in OR  Informed Consent: I have reviewed the patients History and Physical, chart, labs and discussed the procedure including the risks, benefits and alternatives for the proposed anesthesia with the patient or authorized representative who has indicated his/her understanding and acceptance.   Dental advisory given  Plan Discussed with: CRNA  Anesthesia Plan Comments:         Anesthesia Quick Evaluation

## 2016-09-17 NOTE — H&P (Signed)
Chief Complaint: Significant increase and posterior left knee pain and back pain. History: Daniel Short is a very pleasant 71 year old gentleman who underwent a lumbar decompression and in situ fusion on Wednesday, 09/14/2016. Postoperatively the patient improved. On postoperative day 2 his drain was self DC'd but he was ambulating voiding spontaneously with improving back buttock and radicular leg pain. Patient ultimately was evaluated and cleared by physical therapy for home discharge. He was tolerating a regular diet, and was neurologically intact. His overall radicular pain was improving. He was discharged Friday morning 09/16/2016.  The patient called me this morning to indicate that he was having horrific increase and posterior left knee pain in his back pain was getting worse. As a result I advised him to come to the emergency room oracular evaluate him.  The patient arrived in to the emergency room and I was notified. An MRI had been ordered by the emergency room staff.  Past Medical History:  Diagnosis Date  . Arthritis   . CAD (coronary artery disease)    with stent placement  . Complication of anesthesia    wife states he woke up during heart stent,; with stroke he tried to put out IV'S  . Coronary artery arteriosclerosis    had stenting in past; not sure when  . CVA (cerebrovascular accident due to intracerebral hemorrhage) ([place])   . Dyspnea    when walking short distances  . Ear infection 1979   right ear due to wax build up , stayed in hospital several days  . Headache   . Heart attack [facility])    developed 3rd degree AVB, inferior ST elevation following 02/15/02 LHC showing 70% RCA--> s/p temporary PPM and proximal/ostial RCA Zeta stents   . Stroke [facility]) 2000   cva due to intracerebral hemorrhage    Allergies  Allergen Reactions  . No Known Allergies     No current facility-administered medications on file prior to encounter.    Current Outpatient Prescriptions on File  Prior to Encounter  Medication Sig Dispense Refill  . aspirin 325 MG tablet Take 325 mg by mouth daily.    Marland Kitchen atorvastatin (LIPITOR) 20 MG tablet Take 1 tablet (20 mg total) by mouth daily. 30 tablet 2  . hydrochlorothiazide (HYDRODIURIL) 25 MG tablet Take 1 tablet (25 mg total) by mouth daily. 90 tablet 3  . methocarbamol (ROBAXIN) 500 MG tablet Take 1 tablet (500 mg total) by mouth every 6 (six) hours as needed for muscle spasms. 30 tablet 0  . ondansetron (ZOFRAN ODT) 4 MG disintegrating tablet Take 1 tablet (4 mg total) by mouth every 8 (eight) hours as needed for nausea or vomiting. 20 tablet 0  . oxyCODONE 10 MG TABS Take 1 tablet (10 mg total) by mouth every 4 (four) hours as needed for severe pain. 42 tablet 0  . pregabalin (LYRICA) 75 MG capsule Take 1 capsule (75 mg total) by mouth 2 (two) times daily. 60 capsule 2    Physical Exam: Vitals:   09/17/16 0943 09/17/16 0945  BP: 128/72 125/68  Pulse: 78 76  Resp: 18   Temp: 97.9 F (36.6 C)   He is alert and oriented 3.  No shortness of breath, or chest pain.  Abdomen is soft nontender, no rebound tenderness.  Rectal exam: Normal sensation to light touch and pinprick in the perianal region. Digital rectal exam normal, normal rectal tone.  No shoulder or elbow wrist hip near ankle pain with isolated joint range of motion except for the  left knee. Examination of the left knee reveals posterior tenderness. No pulsatile mass. He has no significant pain with circumduction of the knee no gross instability with varus and valgus stress testing at 0 and 30. No palpable defect in the quad or patellar tendon.  Neurological exam.  Upper extremity. 5 out of 5 motor strength throughout. Sensation to light touch and pinprick is intact throughout. 1+ upper extremity deep tendon reflexes biceps, triceps, brachial radialis. Negative Hoffmann sign.  Lower extremity. 5 out of 5 motor strength in the quad, hamstring, tibialis anterior, GA, EHL. He  does elicit posterior knee pain with quad strength testing. But his extensor mechanism is intact. Sensation to light touch and pinprick is intact throughout the lower extremities. Negative Babinski test, 1+ deep tendon reflexes at the knee and Achilles. No clonus.  Lumbar spine. The incision is clean dry and intact dressings are intact. There is no significant swelling no erythema no drainage.  Compartments are soft and nontender. 2+ dorsalis pedis, posterior tibialis pulses bilaterally.   Image: Dg Chest 2 View  Result Date: 08/30/2016 CLINICAL DATA:  Cough, shortness of breath and weakness for 1 day, history smoking, coronary artery disease EXAM: CHEST  2 VIEW COMPARISON:  02/17/2002 FINDINGS: Enlargement of cardiac silhouette. Mediastinal contours and pulmonary vascularity normal. Lungs hyperinflated but clear. No pulmonary infiltrate, pleural effusion, or pneumothorax. Bones unremarkable. IMPRESSION: Enlargement of cardiac silhouette. Hyperinflation without acute infiltrate Electronically Signed   By: Lavonia Dana M.D.   On: 08/30/2016 16:57   Dg Lumbar Spine 2-3 Views  Result Date: 09/14/2016 CLINICAL DATA:  Intraoperative localization. EXAM: LUMBAR SPINE - 2-3 VIEW COMPARISON:  Lumbar spine radiographs May 19, 2016 and the lumbar spine localization September 23, 2006 at 1522 hours FINDINGS: Lateral radiograph for localization labeled "#1" at 1502 hours: Surgical instrument indicates the spinous process of L2 and L3. Lateral radiograph for localization labeled "#2" at 1705 hours: Surgical instrument indicates the posterior elements at L3-4 and L5-S1 on radiograph labeled #2 using the reference of last well-formed intervertebral disc space at L5-S1. IMPRESSION: Lateral radiograph for localization, surgical instruments span L3-4 through L5-S1 posterior elements. Findings called to the operating room and relayed to Dr. Rolena Infante on September 14, 2016 at 1725 hours. Electronically Signed   By: Elon Alas  M.D.   On: 09/14/2016 17:30   Dg Lumbar Spine 1 View  Result Date: 09/14/2016 CLINICAL DATA:  Decompression L3-L5, fusion L4-5. EXAM: LUMBAR SPINE - 1 VIEW COMPARISON:  05/19/2016 FINDINGS: Posterior surgical instruments are directed at the L4-5 disc space. This numbering is different than on prior plain film. I discussed the case with Dr. Rolena Infante who agrees with my numbering as this is the same numbering scheme that was performed on outside MRI. IMPRESSION: Intraoperative localization as above. Electronically Signed   By: Rolm Baptise M.D.   On: 09/14/2016 15:31    A/P: At this point. Patient's neurological exam is essentially unremarkable. There is no clinical evidence to support a cauda equina syndrome at present. He does not have any significant true radicular leg pain. The bulk of his pain is located to the posterior aspect of the left knee. He does have a history of gout and this may represent a gout flare. Although his clinical exam does not demonstrate any focal neurological deficits I would like to get an MRI of his back to ensure that there is no significant abnormality. In addition I am going to aspirate the left knee to rule out gout, as well  as injected with Marcaine And prednisone to aid in relief of his severe pain.  I will also order a lower extremity Doppler exam to ensure that he does not have a deep venous thrombosis (DVT). Continue to monitor his clinical exam. If he is unable to ambulate and there are no significant findings on radiographic examination then we will admit for acute pain control and plan on discharge to a skilled nursing facility/rehabilitation center.    MRI results reviewed. Per the radiologist's question of CSF leak versus hematoma/seroma. Acute L4-5 far lateral disc herniation with significant left L4 neural compression.  At this point I do not think the patient has a CSF leak. Clinically he has no spinal headaches, and the wound demonstrates no evidence of  swelling, or drainage of clear fluid. However he does have a large fluid collection causing significant stenosis. While this may be CSF, my first thought is that it is a hematoma. Fortunately at this point time he does not have signs of a cauda equina syndrome. He is however complaining of severe left neuropathic leg pain. More than likely this is emanating from the L4 neural compression due to the new left foraminal and extraforaminal disc herniation. After discussing the MRI results with the patient and his family have elected to return to the OR today for washout and evacuation of fluid collection exploration for CSF leak and L4 left foraminal discectomy decompression. I discussed the risks with the patient which include infection, bleeding, nerve damage, death, stroke, paralysis, CSF leak, ongoing or worse pain, need for additional surgery, need for fusion surgery with instrumentation. All of the patient and his family's questions were addressed. Consent will be obtained and we'll plan on surgery this afternoon.  Postoperatively aliment patient and plan on ultimately transferring to skilled nursing facility/rehabilitation center instead of returning home. According to his wife she had great difficulty taking care of him.

## 2016-09-17 NOTE — ED Provider Notes (Signed)
[place] DEPT Provider Note   CSN: [id number] Arrival date & time: 09/17/16  4097     History   Chief Complaint Chief Complaint  Patient presents with  . Back Pain  . Post-op Problem    HPI Daniel Short is a 71 y.o. male.  The history is provided by the patient.  Back Pain   This is a new problem. The current episode started 12 to 24 hours ago. The problem occurs constantly. The problem has not changed since onset.Associated with: recent surgery. The pain is present in the lumbar spine. The quality of the pain is described as aching. The pain radiates to the right knee and left knee. The pain is severe. The symptoms are aggravated by bending, certain positions and twisting. The pain is the same all the time. Associated symptoms include leg pain. Pertinent negatives include no chest pain, no fever, no numbness, no abdominal pain, no bowel incontinence, no perianal numbness, no bladder incontinence, no paresthesias and no paresis. He has tried muscle relaxants and analgesics for the symptoms. The treatment provided no relief.   71 year old male who presents with low back pain. POD 3 from Lumbar decompression and fusion for lumbar spinal stenosis by Dr. Melina Schools. He was just discharged from the hospital yesterday.. Beginning yesterday evening began to have progressively worsening low back pain, and bilateral leg pain to the knees. Taking home oxycodone without relief in his pain. No new numbness. Strength of legs limited by pain. Symptoms worse with movement. No fevers or chills. No urinary retention or bowel incontinence. No abdominal pain, nausea or vomiting.  Past Medical History:  Diagnosis Date  . Arthritis   . CAD (coronary artery disease)    with stent placement  . Complication of anesthesia    wife states he woke up during heart stent,; with stroke he tried to put out IV'S  . Coronary artery arteriosclerosis    had stenting in past; not sure when  . CVA  (cerebrovascular accident due to intracerebral hemorrhage) ([place])   . Dyspnea    when walking short distances  . Ear infection 1979   right ear due to wax build up , stayed in hospital several days  . Headache   . Heart attack [facility])    developed 3rd degree AVB, inferior ST elevation following 02/15/02 LHC showing 70% RCA--> s/p temporary PPM and proximal/ostial RCA Zeta stents   . Stroke [facility]) 2000   cva due to intracerebral hemorrhage    Patient Active Problem List   Diagnosis Date Noted  . Status post lumbar surgery 09/14/2016  . Back pain 09/14/2016  . Preoperative clearance 09/07/2016  . HTN (hypertension) 05/19/2016  . Healthcare maintenance 04/16/2015  . Obesity 11/24/2014  . Tobacco abuse 11/24/2014  . Annual physical exam 11/24/2014  . CAD (coronary artery disease), native coronary artery 11/27/2013  . HLD (hyperlipidemia) 11/27/2013    Past Surgical History:  Procedure Laterality Date  . CORONARY STENT PLACEMENT  2005  . HERNIA REPAIR    . KNEE ARTHROSCOPY Right   . LUMBAR LAMINECTOMY/DECOMPRESSION MICRODISCECTOMY N/A 09/14/2016   Procedure: Decompression L3-5, insitu fusion L4-5;  Surgeon: Melina Schools, MD;  Location: [place];  Service: Orthopedics;  Laterality: N/A;  3 hrs       Home Medications    Prior to Admission medications   Medication Sig Start Date End Date Taking? Authorizing Provider  aspirin 325 MG tablet Take 325 mg by mouth daily.    [provider]  atorvastatin (LIPITOR) 20 MG tablet Take 1 tablet (20 mg total) by mouth daily. 08/18/16   Timmothy Euler, MD  hydrochlorothiazide (HYDRODIURIL) 25 MG tablet Take 1 tablet (25 mg total) by mouth daily. 07/15/16   Timmothy Euler, MD  methocarbamol (ROBAXIN) 500 MG tablet Take 1 tablet (500 mg total) by mouth every 6 (six) hours as needed for muscle spasms. 09/15/16   Mayo, Darla Lesches, PA-C  ondansetron (ZOFRAN ODT) 4 MG disintegrating tablet Take 1 tablet (4 mg total) by mouth every 8  (eight) hours as needed for nausea or vomiting. 09/15/16   Mayo, Darla Lesches, PA-C  oxyCODONE 10 MG TABS Take 1 tablet (10 mg total) by mouth every 4 (four) hours as needed for severe pain. 09/15/16 09/22/16  Mayo, Darla Lesches, PA-C  pregabalin (LYRICA) 75 MG capsule Take 1 capsule (75 mg total) by mouth 2 (two) times daily. 07/15/16   Timmothy Euler, MD    Family History Family History  Problem Relation Age of Onset  . Heart attack Mother   . Heart attack Father   . COPD Father   . Heart attack Brother     Social History Social History  Substance Use Topics  . Smoking status: Current Every Day Smoker    Packs/day: 0.25    Years: 54.00    Types: Cigarettes  . Smokeless tobacco: Never Used  . Alcohol use No     Allergies   No known allergies   Review of Systems Review of Systems  Constitutional: Negative for fever.  Respiratory: Negative for shortness of breath.   Cardiovascular: Negative for chest pain.  Gastrointestinal: Negative for abdominal pain and bowel incontinence.  Genitourinary: Negative for bladder incontinence.  Musculoskeletal: Positive for back pain.  Neurological: Negative for numbness and paresthesias.  All other systems reviewed and are negative.    Physical Exam Updated Vital Signs BP 100/65   Pulse 67   Temp 98.2 F (36.8 C) (Oral)   Resp 20   SpO2 93%   Physical Exam Physical Exam  Nursing note and vitals reviewed. Constitutional: Well developed, well nourished, non-toxic, very uncomfortable secondary to pain Head: Normocephalic and atraumatic.  Mouth/Throat: Oropharynx is clear and moist.  Neck: Normal range of motion. Neck supple.   Cardiovascular: Normal rate and regular rhythm.   Pulmonary/Chest: Effort normal and breath sounds normal.  Abdominal: Soft. There is no tenderness. There is no rebound and no guarding.  Musculoskeletal: Limited ROM of bilateral legs due to pain. No deformities, bruising or  swelling. Neurological: Alert, no facial droop, fluent speech, full strength ankle/large toe dorsi/plantar flexion bilaterally, sensation to light touch in tact in bilaterally lower extremities. Skin: Skin is warm and dry. Post-operative wounds over lumbar back clean dry and in tact without drainage, surrounding erythema or induration Psychiatric: Cooperative   ED Treatments / Results  Labs (all labs ordered are listed, but only abnormal results are displayed) Labs Reviewed  CBC WITH DIFFERENTIAL/PLATELET - Abnormal; Notable for the following:       Result Value   WBC 12.6 (*)    RBC 3.94 (*)    Hemoglobin 11.9 (*)    HCT 36.0 (*)    Monocytes Absolute 1.5 (*)    All other components within normal limits  BASIC METABOLIC PANEL - Abnormal; Notable for the following:    Chloride 100 (*)    Glucose, Bld 113 (*)    Calcium 8.8 (*)    All other components within normal limits  SYNOVIAL CELL COUNT + DIFF, W/ CRYSTALS - Abnormal; Notable for the following:    Neutrophil, Synovial 35 (*)    Lymphocytes-Synovial Fld 60 (*)    Monocyte-Macrophage-Synovial Fluid 4 (*)    All other components within normal limits  GRAM STAIN    EKG  EKG Interpretation None       Radiology Mr Lumbar Spine W Wo Contrast  Result Date: 09/17/2016 CLINICAL DATA:  Severe increase in posterior LEFT knee pain. EXAM: MRI LUMBAR SPINE WITHOUT AND WITH CONTRAST TECHNIQUE: Multiplanar and multiecho pulse sequences of the lumbar spine were obtained without and with intravenous contrast. CONTRAST:  70mL MULTIHANCE GADOBENATE DIMEGLUMINE 529 MG/ML IV SOLN COMPARISON:  The patient's preoperative MRI is not available for assessment and comparison. Intraoperative plain films were performed 09/14/2016. Plain films from 05/19/2016 are reviewed, but this numbering scheme is not employed. FINDINGS: Segmentation: Standard numbering scheme. Numbering used today is identical with that which was assessed at time of surgery.  Alignment:  Anatomic Vertebrae:  No worrisome osseous lesion Conus medullaris: Extends to the L1 level and appears normal. Paraspinal and other soft tissues: Paraspinous muscle edema is an expected postoperative finding. Normal-appearing aorta and kidneys. Disc levels: L1-L2:  Normal. L2-L3: Congenital and acquired stenosis with short pedicles. Facet arthropathy. Small 3 x 4 mm subdural collection dorsally, likely postoperative effusion. L3-L4: Posterior decompression with spinous process removal. Central protrusion, but no apparent previous discectomy. T2 hyperintense fluid without significant enhancement fills the laminectomy site. Severe stenosis, with LEFT greater than RIGHT thecal sac compression and displacement ventrally. L4-L5: Status post laminectomy, spinous process removal, and in situ fusion. Critical stenosis. Near complete compression of the thecal sac ventrally. T2 hyperintense fluid extends to the subarticular zone on the LEFT, see image 26 series 7. Large fluid collection fills the laminectomy site, cross-section 24 x 28 x 46 mm. Findings are consistent with a CSF leak from the LEFT L5 nerve root. Postoperative seroma or hematoma not excluded. There is a large disc extrusion in the LEFT foraminal and extraforaminal compartment. Some disc material on the LEFT subarticular zone. See axial image 24, and sagittal images 11-12, all series. Severe LEFT L4 nerve root impingement. L5-S1:  Unremarkable. IMPRESSION: The patient's LEFT knee pain likely derives from a large leftward foraminal and extraforaminal disc extrusion at L4-5. Severe LEFT L4 nerve root impingement is present. I am unable to assess interval change from preoperative MR. Critical stenosis at L4-5 due to a large T2 hyperintense fluid collection at the laminectomy site, measuring 24 x 28 x 46 mm. Suspect CSF leak originating from the LEFT L5 nerve root although postoperative seroma not excluded. Severe additional stenosis at L3-4 due to  compression by the large T2 hyperintense fluid collection. Small subdural fluid collection extends dorsally as far as the L2-L3 interspace, an additional finding suggesting a dural tear. These results were called by telephone at the time of interpretation on 09/17/2016 at 2:15 pm to Dr. Brantley Stage , who verbally acknowledged these results. Electronically Signed   By: Staci Righter M.D.   On: 09/17/2016 14:16    Procedures Procedures (including critical care time) CRITICAL CARE Performed by: Forde Dandy   Total critical care time: 35 minutes  Critical care time was exclusive of separately billable procedures and treating other patients.  Critical care was necessary to treat or prevent imminent or life-threatening deterioration.  Critical care was time spent personally by me on the following activities: development of treatment plan with patient and/or surrogate as  well as nursing, discussions with consultants, evaluation of patient's response to treatment, examination of patient, obtaining history from patient or surrogate, ordering and performing treatments and interventions, ordering and review of laboratory studies, ordering and review of radiographic studies, pulse oximetry and re-evaluation of patient's condition.  Medications Ordered in ED Medications  ceFAZolin (ANCEF) IVPB 2g/100 mL premix (2 g Intravenous New Bag/Given 09/17/16 1557)  ondansetron (ZOFRAN) injection 4 mg (4 mg Intravenous Given 09/17/16 1014)  HYDROmorphone (DILAUDID) injection 0.5 mg (0.5 mg Intravenous Given 09/17/16 1014)  bupivacaine (MARCAINE) 0.5 % injection 5 mL (5 mLs Infiltration Given by Other 09/17/16 1139)  methylPREDNISolone acetate (DEPO-MEDROL) injection 20 mg (20 mg Intra-articular Given 09/17/16 1140)  methocarbamol (ROBAXIN) 500 mg in dextrose 5 % 50 mL IVPB (0 mg Intravenous Stopped 09/17/16 1552)  dexamethasone (DECADRON) injection 10 mg (10 mg Intravenous Given 09/17/16 1207)  HYDROmorphone (DILAUDID)  injection 1 mg (1 mg Intravenous Given 09/17/16 1248)  gabapentin (NEURONTIN) tablet 600 mg (600 mg Oral Given 09/17/16 1228)  gadobenate dimeglumine (MULTIHANCE) injection 20 mL (20 mLs Intravenous Contrast Given 09/17/16 1351)     Initial Impression / Assessment and Plan / ED Course  I have reviewed the triage vital signs and the nursing notes.  Pertinent labs & imaging results that were available during my care of the patient were reviewed by me and considered in my medical decision making (see chart for details).     71 year old male who presents with back pain after recent laminectomy and discectomy. Appears very uncomfortable secondary to pain, but has no focal neurological deficits. Dr. Rolena Infante has evaluated patient in ED. MRI of the lumbar spine is obtained, showing possible CSF leak, recurrent disc herniation with spinal stenosis in the lumbar spine. Discussed with Dr. Rolena Infante who will take patient to the OR for exploration.  Final Clinical Impressions(s) / ED Diagnoses   Final diagnoses:  Back pain  Lumbar disc herniation  Spinal stenosis of lumbar region with neurogenic claudication  Postoperative CSF leak    New Prescriptions New Prescriptions   No medications on file     Forde Dandy, MD 09/17/16 [phone]

## 2016-09-17 NOTE — Op Note (Signed)
Operative dictation.  Preoperative diagnosis. Postoperative hematoma lumbar spine with severe spinal stenosis L3-4, L4-5.  Acute far lateral L4-5 left disc herniation with significant L4 neural compression.  Postoperative diagnosis same.  Operative procedure. Gill decompression left side L4/5 with complete pars resection and decompression of exiting L4 nerve root left side.  Evacuation of hematoma L3-4 L4-5.  Exploration of spinal wound.  Intraoperative findings. No evidence of CSF leak. Direct inspection revealed no leak near or the L5 nerve root on the left side. Valsalva to 40 mmHg failed to demonstrate CSF leak. Thecal sac was adequately decompressed with no evidence of ongoing leak or loss of volume. Acute far lateral left L4-5 disc herniation. Resected under direct visualization. Complete foraminotomy left L4-5. No evidence of residual nerve root compression. No evidence of residual wrist residual spinal stenosis.  Complications none  Condition stable.  Operative note. The patient was brought to the operating room placed supine on the operating table. After successful induction of general anesthesia and endotracheal intubation teds SCDs and a Foley were inserted. The patient was turned prone onto the Wilson frame. All bony prominences were well-padded. The back was then prepped and draped in standard fashion. Timeout was taken to confirm patient, procedure, and all other important data.  The previous incision was opened and I bluntly dissected down to the deep fascia. Sutures were removed from the superficial fascia and the skin and then I removed the deep fascial Vicryl sutures. I then bluntly dissected down to the thecal sac. At this point I noticed there was hematoma and seroma which I evacuated. The thecal sac itself reexpanded and occupied its normal volume. I placed McCullough retractors into the wound and irrigated the copes of normal saline.  Using a Penfield 4 I dissected into  the left lateral recess and expose the L4 and L5 pedicles. I then identified the L5 nerve root and using a 2 and 33 mm Kerrison punch performed a more generous foraminotomy of L5 and resected more of the superior leading edge of the L5 spinous process and lamina. This allowed further decompression inferiorly and laterally. With the L5 nerve root exposed I then performed a Valsalva to 40 mg meter mercury. There was no clear CSF fluid leak emanating from the L5 nerve root or from the thecal sac itself. The fact that thecal sac itself remained completely decompressed and was pulsatile and there was no evidence that there was loss of volume of the thecal sac due to a persistent CSF leak.  Satisfied that there was no CSF leak and was more of a hematoma causing the compression I then placed a [facility] in the L4 foramen and took an intraoperative x-ray. I confirm this level. Once this was confirmed I used my 2 and 3 mm Kerrison punch to resect the entire pars and inferior L4 facet complex this allowed me to completely unroofed the L4 nerve root on the left side and evaluated I then protected and exposed the disc space. There was an acute disc herniation noted which I was able to resect using pituitary rongeurs. An annulotomy was performed with a 15 blade scalpel and then using an neural hook I went into the disc space to ensure there was no other free fragments of disc material.  At this point I completely decompressed the L4 nerve root on the left side into the foramen. At this point I really reviewed the MRI to confirm I had adequately decompressed the entire left side. The large hematoma causing  severe central stenosis at 3445 admission evacuated. The L4-5 foraminal disc on the left side had been removed. The significant L4 neural compression the neural foramen at been addressed by way of removing the pars Gordy Levan decompression). At this point I then carried my [place] for [facility] elevator superiorly to make  sure I had adequate left lateral recess decompressions at L3-4. I then undercut some more of the lateral aspect of the lamina at L3 bilaterally just to ensure an adequate decompression. I then went to the right hand side check the L4-5 and L3-4 disks to ensure there was no disc herniation here and at the foramen remained patent.  At this point I was pleased with my decompression. I copiously irrigated with normal saline and made sure hemostasis using bipolar electrocautery, FloSeal, and thrombin Gelfoam. I then placed half a cc of Depo-Medrol (20 mg/mL) over the left L4 and L5 nerve root for postoperative analgesia.  At this point I made sure the field was dry and there was no ongoing bleeding. Once I confirm this I did place a deep drain and then closed the deep fascia with interrupted #1 Vicryl suture. Superficial with 2-0 Vicryl suture. And then a running 2-0 Prolene suture for the skin. I tied this and the drain in with a 2-0 silk stitch.  At the end of the case all needle sponge counts were correct. Patient was ultimately extubated and transferred to the PACU without incident.

## 2016-09-17 NOTE — ED Notes (Signed)
Called pharmacy about medication  

## 2016-09-17 NOTE — ED Notes (Signed)
Patient is unable to walk at this time, because he will be going to OR.

## 2016-09-17 NOTE — Progress Notes (Signed)
VASCULAR LAB PRELIMINARY  PRELIMINARY  PRELIMINARY  PRELIMINARY  Bilateral lower extremity venous duplex completed.    Preliminary report:  There is no obvious evidence of DVT or SVT noted in the bilateral lower extremities.   Adhya Cocco, RVT 09/17/2016, 12:45 PM

## 2016-09-18 ENCOUNTER — Encounter (HOSPITAL_COMMUNITY): Payer: Self-pay | Admitting: Orthopedic Surgery

## 2016-09-18 MED ORDER — POLYETHYLENE GLYCOL 3350 17 G PO PACK
17.0000 g | PACK | Freq: Every day | ORAL | Status: DC
  Administered 2016-09-18 – 2016-09-20 (×3): 17 g via ORAL
  Filled 2016-09-18 (×2): qty 1

## 2016-09-18 MED ORDER — DOCUSATE SODIUM 100 MG PO CAPS
100.0000 mg | ORAL_CAPSULE | Freq: Two times a day (BID) | ORAL | Status: DC
  Administered 2016-09-18 – 2016-09-20 (×5): 100 mg via ORAL
  Filled 2016-09-18 (×5): qty 1

## 2016-09-18 MED ORDER — BISACODYL 5 MG PO TBEC: 5.0000 mg | DELAYED_RELEASE_TABLET | Freq: Every day | ORAL | Status: DC | PRN

## 2016-09-18 MED ORDER — CEFAZOLIN SODIUM-DEXTROSE 2-4 GM/100ML-% IV SOLN
2.0000 g | Freq: Three times a day (TID) | INTRAVENOUS | Status: AC
  Administered 2016-09-18 – 2016-09-20 (×6): 2 g via INTRAVENOUS
  Filled 2016-09-18 (×7): qty 100

## 2016-09-18 NOTE — Evaluation (Signed)
Occupational Therapy Evaluation Patient Details Name: Daniel Short MRN: [id number] DOB: [birth year] Today's Date: 09/18/2016    History of Present Illness Pt is a 71 y.o. with recent lumbar decompression and fusion Wednesday 09/14/16. He was discharged with assistance from family however experienced significant increase in posterior L knee and back pain and was readmitted. Pt found to have acute L4-5 far lateral disc herniation with significant L4 neural compression and hematoma. He is now s/p wound exploration, GILL decompression, far lateral discectomy L  L4-5, and evacuation of hematoma. PMH significant for: arthritis, CAD, complication of anesthesia, coronary artery arteriosclerosis, CVA, dyspnea, and heart attack.   Clinical Impression   Limited evaluation performed due to pt not receiving brace as of yet. Pt reports increasing difficulty with ADL once discharged home from previous admission requiring significant assistance. Prior to previous admission he was independent with ADL and functional mobility.  Able to complete grooming tasks with set-up, LB ADL with mod assist, and UB ADL with min guard assist. Per orders, pt cleared to complete toilet transfers without brace and thus limited mobility to stand-pivot toilet transfer to and from [facility] with pt requiring min assist. Pt reporting some dizziness throughout session resolving in reclined position. Pt and son educated on back precautions during ADL. At current functional level, feel pt would benefit from SNF level rehabilitation in order to maximize independence with ADL and functional mobility prior to returning home.     Follow Up Recommendations  SNF;Supervision/Assistance - 24 hour    Equipment Recommendations  Other (comment) (TBD at next venue of care)    Recommendations for Other Services       Precautions / Restrictions Precautions Precautions: Back Precaution Booklet Issued: No Precaution Comments: Reviewed back precautions  during ADL. Pt able to recall 3/3 with increased time.  Required Braces or Orthoses: Spinal Brace Spinal Brace: Thoracolumbosacral orthotic;Applied in sitting position (Per orders may ambulate to bathroom without brace) Restrictions Weight Bearing Restrictions: No      Mobility Bed Mobility Overal bed mobility: Needs Assistance Bed Mobility: Rolling;Sidelying to Sit;Sit to Sidelying Rolling: Min assist Sidelying to sit: Min assist     Sit to sidelying: Min assist General bed mobility comments: Min assist to manage B LE's.  Transfers Overall transfer level: Needs assistance Equipment used: Rolling walker (2 wheeled) Transfers: Sit to/from [facility] Sit to Stand: Min assist;From elevated surface Stand pivot transfers: Min assist;From elevated surface       General transfer comment: Min assist for steadying.    Balance                                           ADL either performed or assessed with clinical judgement   ADL Overall ADL's : Needs assistance/impaired Eating/Feeding: Set up;Bed level   Grooming: Set up;Wash/dry face;Sitting   Upper Body Bathing: Min guard;Sitting   Lower Body Bathing: Moderate assistance;Sit to/from stand   Upper Body Dressing : Min guard;Sitting   Lower Body Dressing: Moderate assistance;Sit to/from stand   Toilet Transfer: Minimal assistance;Stand-pivot;BSC;RW Armed forces technical officer Details (indicate cue type and reason): Per orders, pt able to complete toilet transfers without brace Toileting- Clothing Manipulation and Hygiene: Moderate assistance;Sit to/from stand       Functional mobility during ADLs: Minimal assistance;Rolling walker (stand-pivot only) General ADL Comments: Limited mobility to stand-pivot toilet transfer this session due to pt not having brace  yet. Per orders, able to complete toilet transfers without brace. Pt educated on back precautions during ADL and he was able to recall  precautions with increased processing time. He required VC's to adhere to back precautions and prevent bending and twisting. Son present and engaged in session.      Vision   Vision Assessment?: No apparent visual deficits     Perception     Praxis      Pertinent Vitals/Pain Pain Assessment: 0-10 Pain Score: 4  Pain Location: back and head Pain Descriptors / Indicators: Aching;Pressure;Sore Pain Intervention(s): Limited activity within patient's tolerance;Monitored during session;Repositioned     Hand Dominance Right   Extremity/Trunk Assessment Upper Extremity Assessment Upper Extremity Assessment: Generalized weakness   Lower Extremity Assessment Lower Extremity Assessment: Defer to PT evaluation   Cervical / Trunk Assessment Cervical / Trunk Assessment: Other exceptions Cervical / Trunk Exceptions: s/p spine surgery   Communication Communication Communication: No difficulties   Cognition Arousal/Alertness: Awake/alert Behavior During Therapy: WFL for tasks assessed/performed Overall Cognitive Status: Within Functional Limits for tasks assessed                                 General Comments: Slow processing at baseline.   General Comments  Son present during session.     Exercises     Shoulder Instructions      Home Living Family/patient expects to be discharged to:: Private residence Living Arrangements: Spouse/significant other Available Help at Discharge: Family;Available 24 hours/day Type of Home: House Home Access: Stairs to enter [employer] of Steps: 1 Entrance Stairs-Rails: None Home Layout: One level     Bathroom Shower/Tub: Occupational psychologist: Handicapped height Bathroom Accessibility: Yes How Accessible: Accessible via walker Home Equipment: Walker - standard;Shower seat;Cane - single point          Prior Functioning/Environment Level of Independence: Independent                 OT  Problem List: Decreased strength;Decreased activity tolerance;Impaired balance (sitting and/or standing);Decreased safety awareness;Decreased knowledge of use of DME or AE;Decreased knowledge of precautions;Pain      OT Treatment/Interventions: Self-care/ADL training;Therapeutic exercise;Energy conservation;DME and/or AE instruction;Therapeutic activities;Patient/family education;Balance training    OT Goals(Current goals can be found in the care plan section) Acute Rehab OT Goals Patient Stated Goal: to have less pain OT Goal Formulation: With patient/family Time For Goal Achievement: 10/02/16 Potential to Achieve Goals: Good ADL Goals Pt Will Perform Grooming: with min guard assist;standing Pt Will Perform Upper Body Dressing: with supervision;sitting (including brace) Pt Will Perform Lower Body Dressing: with min guard assist;with adaptive equipment;sit to/from stand Pt Will Transfer to Toilet: with supervision;ambulating;bedside commode (BSC over toilet) Pt Will Perform Toileting - Clothing Manipulation and hygiene: with min guard assist;sit to/from stand Additional ADL Goal #1: Pt will adhere to 3/3 back precautions during morning ADL routine with no more than 1 VC.  OT Frequency: Min 2X/week   Barriers to D/C:            Co-evaluation              AM-PAC PT "6 Clicks" Daily Activity     Outcome Measure Help from another person eating meals?: None Help from another person taking care of personal grooming?: A Little Help from another person toileting, which includes using toliet, bedpan, or urinal?: A Lot Help from another person bathing (including washing, rinsing, drying)?:  A Lot Help from another person to put on and taking off regular upper body clothing?: A Little Help from another person to put on and taking off regular lower body clothing?: A Lot 6 Click Score: 16   End of Session Equipment Utilized During Treatment: Rolling walker (brace not delivered; cleared for  [facility] transfer) Nurse Communication: Mobility status (pt needs brace; IV beeping)  Activity Tolerance: Patient tolerated treatment well Patient left: in bed;with call bell/phone within reach;with family/visitor present  OT Visit Diagnosis: Other abnormalities of gait and mobility (R26.89);Pain;Muscle weakness (generalized) (M62.81) Pain - part of body:  (back)                Time: 0826-0902 OT Time Calculation (min): 36 min Charges:  OT General Charges $OT Visit: 1 Procedure OT Evaluation $OT Eval Moderate Complexity: 1 Procedure OT Treatments $Self Care/Home Management : 8-22 mins G-Codes:     Norman Herrlich, MS OTR/L  Pager: [place] A Jachin Coury 09/18/2016, 10:52 AM

## 2016-09-18 NOTE — Progress Notes (Signed)
Orthopedic Tech Progress Note Patient Details:  Daniel Short 1945/04/25 [id number]  Patient ID: Daniel Short, male   DOB: [birth year], 71 y.o.   MRN: [id number]   [name] 09/18/2016, 9:26 AM Called in bio-tech brace order; spoke with answering service

## 2016-09-18 NOTE — Progress Notes (Signed)
Pt foley removed per order at 0630. JP drain remains intact to back with gravity suctioning. Delia Heady RN

## 2016-09-18 NOTE — Progress Notes (Addendum)
Daniel Short  MRN: [id number] DOB/Age: Jul 26, 1945 71 y.o. Physician: Rolena Infante Procedure: Procedure(s) (LRB): WOUND EXPLORATION, GILL DECOMPRESSION,FAR LATERAL DISCECTOMY LEFT L4-L5, AND EVACUATION OF HEMATOMA (N/A)     Subjective: Back pain as expected, awaiting brace but should be delivered today. No complaints of leg pain.Eating small amounts. Positive flatus.   Vital Signs Temp:  [97.3 F (36.3 C)-99 F (37.2 C)] 98.6 F (37 C) (06/24 0509) Pulse Rate:  [63-100] 63 (06/24 0509) Resp:  [16-20] 18 (06/24 0509) BP: (100-152)/(61-87) 109/62 (06/24 0509) SpO2:  [64 %-100 %] 98 % (06/24 0509) Weight:  [93.4 kg (206 lb)] 93.4 kg (206 lb) (06/23 2041)  Lab Results  Recent Labs  09/17/16 0956  WBC 12.6*  HGB 11.9*  HCT 36.0*  PLT 173   BMET  Recent Labs  09/17/16 0956  NA 138  K 3.5  CL 100*  CO2 30  GLUCOSE 113*  BUN 14  CREATININE 0.94  CALCIUM 8.8*   No results found for: INR   Exam Good strength on bilat EHL JP drain charged with mild drainage Abd soft and non painful        Plan Continue IV abx while JP drain in Leave JP drain today Mobilize once brace arrives No BM since wed so need to monitor status  Colace and miralax and dulcolax ordered  [facility] PA-C   09/18/2016, 9:07 AM Contact # [phone]

## 2016-09-18 NOTE — Progress Notes (Signed)
Patient resting in bed with eyes closed.  No complaints of pain or discomfort at this time.  Up periodically to bedside chair with brace on.  Patient educated on use of brace.  JP drain set to charge.  Bandage dry, clean and intact.

## 2016-09-18 NOTE — Evaluation (Signed)
Physical Therapy Evaluation Patient Details Name: Daniel Short MRN: [id number] DOB: 1945-07-23 Today's Date: 09/18/2016   History of Present Illness  Pt is a 71 y.o. with recent lumbar decompression and fusion Wednesday 09/14/16. He was discharged with assistance from family however experienced significant increase in posterior L knee and back pain and was readmitted. Pt found to have acute L4-5 far lateral disc herniation with significant L4 neural compression and hematoma. He is now s/p wound exploration, GILL decompression, far lateral discectomy L  L4-5, and evacuation of hematoma. PMH significant for: arthritis, CAD, complication of anesthesia, coronary artery arteriosclerosis, CVA, dyspnea, and heart attack.  Clinical Impression  Patient presents with problems listed below.  Will benefit from acute PT to maximize functional mobility prior to discharge.  Patient independent prior to initial surgery.  Now requiring up to mod assist with mobility.  Recommend ST-SNF for continued therapy with goal to improve to Mod I level and return home with wife.    Follow Up Recommendations SNF    Equipment Recommendations  None recommended by PT    Recommendations for Other Services       Precautions / Restrictions Precautions Precautions: Back Precaution Booklet Issued: Yes (comment) Precaution Comments: Reviewed back precautions and other information with patient and wife. Required Braces or Orthoses: Spinal Brace Spinal Brace: Thoracolumbosacral orthotic;Applied in sitting position Restrictions Weight Bearing Restrictions: No      Mobility  Bed Mobility Overal bed mobility: Needs Assistance Bed Mobility: Rolling;Sidelying to Sit Rolling: Modified independent (Device/Increase time) Sidelying to sit: Min guard       General bed mobility comments: Verbal cues for rolling - patient using rail.  Patient able to use UE's to move trunk to sitting position.  Min guard for safety.   Instructed patient and wife on adjusting brace, brace application.  Transfers Overall transfer level: Needs assistance Equipment used: Rolling walker (2 wheeled) Transfers: Sit to/from Stand Sit to Stand: Min guard         General transfer comment: Reviewed technique.  Assist for safety.  Ambulation/Gait Ambulation/Gait assistance: Min guard Ambulation Distance (Feet): 180 Feet Assistive device: Rolling walker (2 wheeled) Gait Pattern/deviations: Step-through pattern;Decreased stride length;Trunk flexed Gait velocity: slowed Gait velocity interpretation: Below normal speed for age/gender General Gait Details: Patient with fairly steady gait.  Cues to stand upright during gait.  Assist for safety.  Stairs            Wheelchair Mobility    Modified Rankin (Stroke Patients Only)       Balance Overall balance assessment: Needs assistance         Standing balance support: Bilateral upper extremity supported Standing balance-Leahy Scale: Poor                               Pertinent Vitals/Pain Pain Assessment: 0-10 Pain Score: 5  Pain Location: back Pain Descriptors / Indicators: Aching;Sore Pain Intervention(s): Monitored during session;Repositioned    Home Living Family/patient expects to be discharged to:: Private residence Living Arrangements: Spouse/significant other Available Help at Discharge: Family;Available 24 hours/day Type of Home: House Home Access: Stairs to enter Entrance Stairs-Rails: None Entrance Stairs-Number of Steps: 1 Home Layout: One level Home Equipment: Walker - standard;Shower seat;Cane - single point;Walker - 4 wheels      Prior Function Level of Independence: Independent (Prior to first back surgery)         Comments: drives     Hand Dominance  Dominant Hand: Right    Extremity/Trunk Assessment   Upper Extremity Assessment Upper Extremity Assessment: Defer to OT evaluation    Lower Extremity  Assessment Lower Extremity Assessment: Generalized weakness       Communication   Communication: No difficulties  Cognition Arousal/Alertness: Awake/alert Behavior During Therapy: WFL for tasks assessed/performed Overall Cognitive Status: Within Functional Limits for tasks assessed                                 General Comments: Slow processing at baseline.      General Comments      Exercises     Assessment/Plan    PT Assessment Patient needs continued PT services  PT Problem List Decreased strength;Decreased balance;Decreased mobility;Decreased knowledge of use of DME;Decreased knowledge of precautions;Pain       PT Treatment Interventions DME instruction;Gait training;Stair training;Functional mobility training;Therapeutic activities;Patient/family education    PT Goals (Current goals can be found in the Care Plan section)  Acute Rehab PT Goals Patient Stated Goal: to have less pain PT Goal Formulation: With patient/family Time For Goal Achievement: 09/25/16 Potential to Achieve Goals: Good    Frequency Min 5X/week   Barriers to discharge        Co-evaluation               AM-PAC PT "6 Clicks" Daily Activity  Outcome Measure Difficulty turning over in bed (including adjusting bedclothes, sheets and blankets)?: None Difficulty moving from lying on back to sitting on the side of the bed? : A Little Difficulty sitting down on and standing up from a chair with arms (e.g., wheelchair, bedside commode, etc,.)?: A Little Help needed moving to and from a bed to chair (including a wheelchair)?: A Little Help needed walking in hospital room?: A Little Help needed climbing 3-5 steps with a railing? : A Little 6 Click Score: 19    End of Session Equipment Utilized During Treatment: Gait belt;Back brace Activity Tolerance: Patient tolerated treatment well Patient left: in chair;with call bell/phone within reach;with family/visitor present Nurse  Communication: Mobility status (Patient in chair) PT Visit Diagnosis: Unsteadiness on feet (R26.81);Other abnormalities of gait and mobility (R26.89);Muscle weakness (generalized) (M62.81);Pain Pain - part of body:  (back)    Time: 9767-3419 PT Time Calculation (min) (ACUTE ONLY): 33 min   Charges:   PT Evaluation $PT Eval Moderate Complexity: 1 Procedure PT Treatments $Gait Training: 8-22 mins   PT G Codes:        Carita Pian. Sanjuana Kava, [facility] Acute Rehab Services Pager [place] 09/18/2016, 10:45 PM

## 2016-09-19 MED FILL — Thrombin For Soln Kit 20000 Unit: CUTANEOUS | Qty: 2 | Status: AC

## 2016-09-19 NOTE — Progress Notes (Signed)
Progress note concerning pt/wife education  Spent time with pt and pt's wife making wife feel educated on all the do's and don'ts so when pt finally goes home she is not so concerned.  Wife seems to connect going straight home with likelihood of surgical failure again.  Tried to allay her fears.    09/19/16 1239  PT Visit Information  Last PT Received On 09/19/16  Assistance Needed +1  History of Present Illness Pt is a 71 y.o. with recent lumbar decompression and fusion Wednesday 09/14/16. He was discharged with assistance from family however experienced significant increase in posterior L knee and back pain and was readmitted. Pt found to have acute L4-5 far lateral disc herniation with significant L4 neural compression and hematoma. He is now s/p wound exploration, GILL decompression, far lateral discectomy L  L4-5, and evacuation of hematoma. PMH significant for: arthritis, CAD, complication of anesthesia, coronary artery arteriosclerosis, CVA, dyspnea, and heart attack.  Subjective Data  Subjective We'll do whatever Dr. Rolena Infante wants...  Patient Stated Goal to have less pain  Precautions  Precautions Back  Precaution Booklet Issued Yes (comment)  Precaution Comments Reviewed back precautions and other information with patient and wife.  Pain Assessment  Faces Pain Scale 2  Pain Location back  Pain Descriptors / Indicators Sore  Pain Intervention(s) Monitored during session  Cognition  Arousal/Alertness Awake/alert  Behavior During Therapy WFL for tasks assessed/performed  Overall Cognitive Status Within Functional Limits for tasks assessed  General Comments Slow processing at baseline.  General Comments  General comments (skin integrity, edema, etc.) pt's wife very concerned with any talk concerning going directly home and not the nursing home.  Spent extensive time trying to alleviate most of her fears about him returning home.  Reinforced all eduction with the wife, demonstrated for  pt /wife getting in/oob, log roll, we rediscussed donning/doffing the brace and it's wearing schedule, discussed lifting restrictions and progression of activity.  pt's wife still concerned that the home was a contributing factor to the surgical failure.  I explained that we have nothing else to educate on, but that she should always defer to Dr brooks  Pt is ready to d/c from mobility standpoint.  PT - End of Session  Equipment Utilized During Treatment Back brace  Nurse Communication Mobility status  PT - Assessment/Plan  PT Plan Current plan remains appropriate  PT Visit Diagnosis Pain  Pain - part of body (back)  PT Frequency (ACUTE ONLY) Min 5X/week  Follow Up Recommendations No PT follow up;Supervision/Assistance - 24 hour  PT equipment None recommended by PT  AM-PAC PT "6 Clicks" Daily Activity Outcome Measure  Difficulty turning over in bed (including adjusting bedclothes, sheets and blankets)? 4  Difficulty moving from lying on back to sitting on the side of the bed?  3  Difficulty sitting down on and standing up from a chair with arms (e.g., wheelchair, bedside commode, etc,.)? 3  Help needed moving to and from a bed to chair (including a wheelchair)? 3  Help needed walking in hospital room? 3  Help needed climbing 3-5 steps with a railing?  3  6 Click Score 19  Mobility G Code  CJ  Acute Rehab PT Goals  Time For Goal Achievement 09/25/16  Potential to Achieve Goals Good  PT Time Calculation  PT Start Time (ACUTE ONLY) 1206  PT Stop Time (ACUTE ONLY) 1238  PT Time Calculation (min) (ACUTE ONLY) 32 min  PT General Charges  $$ ACUTE PT VISIT  1 Procedure  PT Treatments  $Self Care/Home Management 23-37   09/19/2016  Donnella Sham, [place] [phone]  (pager)

## 2016-09-19 NOTE — Progress Notes (Signed)
Physical Therapy Treatment Patient Details Name: Daniel Short MRN: [id number] DOB: [birth year] Today's Date: 09/19/2016    History of Present Illness Pt is a 71 y.o. with recent lumbar decompression and fusion Wednesday 09/14/16. He was discharged with assistance from family however experienced significant increase in posterior L knee and back pain and was readmitted. Pt found to have acute L4-5 far lateral disc herniation with significant L4 neural compression and hematoma. He is now s/p wound exploration, GILL decompression, far lateral discectomy L  L4-5, and evacuation of hematoma. PMH significant for: arthritis, CAD, complication of anesthesia, coronary artery arteriosclerosis, CVA, dyspnea, and heart attack.    PT Comments    Progressing well.  Emphasis on pt ed, transfer safety, progressive ambulation with RW and stairs.   Follow Up Recommendations  No PT follow up;Supervision/Assistance - 24 hour     Equipment Recommendations  None recommended by PT    Recommendations for Other Services       Precautions / Restrictions Precautions Precautions: Back Precaution Booklet Issued: Yes (comment) Precaution Comments: Reviewed back precautions and other information with patient and wife. Required Braces or Orthoses: Spinal Brace Spinal Brace: Thoracolumbosacral orthotic;Applied in sitting position Restrictions Weight Bearing Restrictions: No    Mobility  Bed Mobility Overal bed mobility: Needs Assistance Bed Mobility: Rolling;Sidelying to Sit Rolling: Modified independent (Device/Increase time)         General bed mobility comments: light use of rail, but pt should be able to accomplish side to sit without assist or rail.  Cues for better technique  Transfers Overall transfer level: Needs assistance Equipment used: Rolling walker (2 wheeled) Transfers: Sit to/from Stand Sit to Stand: Supervision         General transfer comment: cues for safety, no assist  needed  Ambulation/Gait Ambulation/Gait assistance: Supervision Ambulation Distance (Feet): 350 Feet Assistive device: Rolling walker (2 wheeled) Gait Pattern/deviations: Step-through pattern Gait velocity: slowed Gait velocity interpretation: Below normal speed for age/gender General Gait Details: steady gait, still slow with cadence, but can speed up to cue   Stairs Stairs: Yes   Stair Management: One rail Right Number of Stairs: 3 General stair comments: safe with rail  Wheelchair Mobility    Modified Rankin (Stroke Patients Only)       Balance             Standing balance-Leahy Scale: Good                              Cognition Arousal/Alertness: Awake/alert Behavior During Therapy: WFL for tasks assessed/performed Overall Cognitive Status: Within Functional Limits for tasks assessed                                 General Comments: Slow processing at baseline.      Exercises      General Comments General comments (skin integrity, edema, etc.): Reinforced back care/precautions, transitions to/from sit, donning brace, lifting restrictions, and progression of activity.      Pertinent Vitals/Pain Pain Assessment: Faces Faces Pain Scale: Hurts a little bit Pain Location: back Pain Descriptors / Indicators: Sore Pain Intervention(s): Monitored during session    Home Living                      Prior Function            PT Goals (current goals can  now be found in the care plan section) Acute Rehab PT Goals Patient Stated Goal: to have less pain PT Goal Formulation: With patient/family Time For Goal Achievement: 09/25/16 Potential to Achieve Goals: Good Progress towards PT goals: Progressing toward goals    Frequency    Min 5X/week      PT Plan Current plan remains appropriate    Co-evaluation              AM-PAC PT "6 Clicks" Daily Activity  Outcome Measure  Difficulty turning over in bed  (including adjusting bedclothes, sheets and blankets)?: None Difficulty moving from lying on back to sitting on the side of the bed? : A Little Difficulty sitting down on and standing up from a chair with arms (e.g., wheelchair, bedside commode, etc,.)?: A Little Help needed moving to and from a bed to chair (including a wheelchair)?: A Little Help needed walking in hospital room?: A Little Help needed climbing 3-5 steps with a railing? : A Little 6 Click Score: 19    End of Session Equipment Utilized During Treatment: Back brace Activity Tolerance: Patient tolerated treatment well Patient left: Other (comment) (in bathroom instructed to call nurse for help.) Nurse Communication: Mobility status PT Visit Diagnosis: Other abnormalities of gait and mobility (R26.89);Pain Pain - part of body:  (back incision)     Time: 9357-0177 PT Time Calculation (min) (ACUTE ONLY): 25 min  Charges:  $Gait Training: 8-22 mins $Therapeutic Activity: 8-22 mins                    G Codes:       Oct 19, 2016  Donnella Sham, PT [phone]  (pager)   Tessie Fass Girtie Wiersma 10/19/16, 11:37 AM

## 2016-09-19 NOTE — NC FL2 (Signed)
[place] LEVEL OF CARE SCREENING TOOL     IDENTIFICATION  Patient Name: Daniel Short Birthdate: Jan 01, 1946 Sex: male Admission Date (Current Location): 09/17/2016  [facility] and [place] Number:  [employer] and Address:  [facility] [place], [place] [address] [place], [place] [address]      Provider Number: [id number]  Attending Physician Name and Address:  Melina Schools, MD  Relative Name and Phone Number:       Current Level of Care: Hospital Recommended Level of Care: [place] Prior Approval Number:    Date Approved/Denied:   PASRR Number: [id number]  Discharge Plan: SNF    Current Diagnoses: Patient Active Problem List   Diagnosis Date Noted  . Hematoma 09/17/2016  . Status post lumbar surgery 09/14/2016  . Back pain 09/14/2016  . Preoperative clearance 09/07/2016  . HTN (hypertension) 05/19/2016  . Healthcare maintenance 04/16/2015  . Obesity 11/24/2014  . Tobacco abuse 11/24/2014  . Annual physical exam 11/24/2014  . CAD (coronary artery disease), native coronary artery 11/27/2013  . HLD (hyperlipidemia) 11/27/2013    Orientation RESPIRATION BLADDER Height & Weight     Self, Time, Situation, Place  Normal Continent Weight: 206 lb (93.4 kg) Height:  5\' 5"  (165.1 cm)  BEHAVIORAL SYMPTOMS/MOOD NEUROLOGICAL BOWEL NUTRITION STATUS      Continent    AMBULATORY STATUS COMMUNICATION OF NEEDS Skin   Limited Assist Verbally Surgical wounds                       Personal Care Assistance Level of Assistance  Bathing, Dressing Bathing Assistance: Limited assistance   Dressing Assistance: Limited assistance     Functional Limitations Info             SPECIAL CARE FACTORS FREQUENCY  PT (By licensed PT), OT (By licensed OT)     PT Frequency: 5x/wk OT Frequency: 5x/wk            Contractures      Additional Factors Info  Code Status, Allergies Code Status Info: full Allergies  Info: nka           Current Medications (09/19/2016):  This is the current hospital active medication list Current Facility-Administered Medications  Medication Dose Route Frequency Provider Last Rate Last Dose  . 0.9 %  sodium chloride infusion  250 mL Intravenous Continuous Melina Schools, MD 1 mL/hr at 09/18/16 0906 250 mL at 09/18/16 0906  . acetaminophen (TYLENOL) tablet 650 mg  650 mg Oral Q4H PRN Melina Schools, MD       Or  . acetaminophen (TYLENOL) suppository 650 mg  650 mg Rectal Q4H PRN Melina Schools, MD      . bisacodyl (DULCOLAX) EC tablet 5 mg  5 mg Oral Daily PRN Shuford, Tracy, PA-C      . ceFAZolin (ANCEF) IVPB 2g/100 mL premix  2 g Intravenous Q8H Shuford, Tracy, PA-C   Stopped at 09/19/16 [phone]  . docusate sodium (COLACE) capsule 100 mg  100 mg Oral BID Shuford, Tracy, PA-C   100 mg at 09/19/16 3086  . lactated ringers infusion   Intravenous Continuous Melina Schools, MD   Stopped at 09/18/16 2000  . menthol-cetylpyridinium (CEPACOL) lozenge 3 mg  1 lozenge Oral PRN Melina Schools, MD       Or  . phenol (CHLORASEPTIC) mouth spray 1 spray  1 spray Mouth/Throat PRN Melina Schools, MD      . methocarbamol (ROBAXIN) tablet 500  mg  500 mg Oral Q6H PRN Melina Schools, MD   500 mg at 09/19/16 0533   Or  . methocarbamol (ROBAXIN) 500 mg in dextrose 5 % 50 mL IVPB  500 mg Intravenous Q6H PRN Melina Schools, MD      . ondansetron [facility]) tablet 4 mg  4 mg Oral Q6H PRN Melina Schools, MD       Or  . ondansetron [facility]) injection 4 mg  4 mg Intravenous Q6H PRN Melina Schools, MD      . oxyCODONE (Oxy IR/ROXICODONE) immediate release tablet 10 mg  10 mg Oral Q4H PRN Melina Schools, MD   10 mg at 09/19/16 0533  . polyethylene glycol (MIRALAX / GLYCOLAX) packet 17 g  17 g Oral Daily PRN Melina Schools, MD   17 g at 09/18/16 0939  . polyethylene glycol (MIRALAX / GLYCOLAX) packet 17 g  17 g Oral Daily Shuford, Tracy, PA-C   17 g at 09/19/16 0926  . sodium chloride flush (NS) 0.9 %  injection 3 mL  3 mL Intravenous Q12H Melina Schools, MD   3 mL at 09/19/16 1000  . sodium chloride flush (NS) 0.9 % injection 3 mL  3 mL Intravenous PRN Melina Schools, MD         Discharge Medications: Please see discharge summary for a list of discharge medications.  Relevant Imaging Results:  Relevant Lab Results:   Additional Information SS#: [id number]  Geralynn Ochs, LCSW

## 2016-09-19 NOTE — Progress Notes (Signed)
Pt had an uneventful night; slept comfortably in bed; pain well managed with prns. JP drain remains intact and charged. Will report off to oncoming RN. Delia Heady RN

## 2016-09-19 NOTE — Progress Notes (Signed)
    Subjective: 2 Days Post-Op Procedure(s) (LRB): WOUND EXPLORATION, GILL DECOMPRESSION,FAR LATERAL DISCECTOMY LEFT L4-L5, AND EVACUATION OF HEMATOMA (N/A) Patient reports pain as 3 on 0-10 scale.   Denies CP or SOB.  Voiding without difficulty. Positive flatus. Pt reports ambulating in hallway in his TLSO Objective: Vital signs in last 24 hours: Temp:  [98.1 F (36.7 C)-98.6 F (37 C)] 98.1 F (36.7 C) (06/25 0528) Pulse Rate:  [71-93] 71 (06/25 0528) Resp:  [16-18] 18 (06/25 0528) BP: (113-133)/(58-79) 133/79 (06/25 0528) SpO2:  [95 %-99 %] 97 % (06/25 0528)  Intake/Output from previous day: 06/24 0701 - 06/25 0700 In: 361.5 [I.V.:16.5; IV Piggyback:300] Out: 50 [Drains:50] Intake/Output this shift: No intake/output data recorded.  Labs:  Recent Labs  09/17/16 0956  HGB 11.9*    Recent Labs  09/17/16 0956  WBC 12.6*  RBC 3.94*  HCT 36.0*  PLT 173    Recent Labs  09/17/16 0956  NA 138  K 3.5  CL 100*  CO2 30  BUN 14  CREATININE 0.94  GLUCOSE 113*  CALCIUM 8.8*   No results for input(s): LABPT, INR in the last 72 hours.  Physical Exam: Neurologically intact ABD soft Sensation intact distally Dorsiflexion/Plantar flexion intact Incision: no drainage Compartment soft  Assessment/Plan: 2 Days Post-Op Procedure(s) (LRB): WOUND EXPLORATION, GILL DECOMPRESSION,FAR LATERAL DISCECTOMY LEFT L4-L5, AND EVACUATION OF HEMATOMA (N/A) Advance diet Up with therapy  Continue to utilize TLSO Drain output 50 cc this am.  Will continue to monitor Consider DC tomorrow   [place], Darla Lesches for Dr. Melina Schools [facility] [phone] 09/19/2016, 7:03 AM

## 2016-09-19 NOTE — Clinical Social Work Note (Signed)
Clinical Social Work Assessment  Patient Details  Name: Daniel Short MRN: [id number] Date of Birth: 1945/05/16  Date of referral:  09/19/16               Reason for consult:  Facility Placement, Discharge Planning                Permission sought to share information with:  Facility Sport and exercise psychologist, Family Supports Permission granted to share information::  Yes, Verbal Permission Granted  Name::     Brewing technologist::  SNF  Relationship::  Wife  Contact Information:     Housing/Transportation Living arrangements for the past 2 months:  Single Family Home Source of Information:  Patient Patient Interpreter Needed:  None Criminal Activity/Legal Involvement Pertinent to Current Situation/Hospitalization:  No - Comment as needed Significant Relationships:  Adult Children, Spouse, Other Family Members Lives with:  Self, Spouse Do you feel safe going back to the place where you live?  Yes Need for family participation in patient care:  No (Coment)  Care giving concerns:  Patient currently resides at home with spouse, who has concerns about ensuring that the patient has proper care after discharge so that he does not wind up back at the hospital.   Social Worker assessment / plan:  CSW introduced self and explained role to patient and patient's wife, in room. Patient and patient's wife acknowledged that they would ultimately prefer that patient go home, but that the doctor had mentioned a skilled nursing facility placement in discussion with them twice, so they want to make sure that the doctor thinks that the patient is safe to return home. Patient discussed how he had been in the worst pain of his life after his last back surgery, and he does not want to go through that again. Per PT session today, patient is making progress and likely will not qualify for SNF placement; CSW explained prior approval process with patient and patient's wife, and they could private pay if desired if  insurance didn't approve SNF placement. Patient and patient's wife indicated preference for staying close to home in [place], if SNF recommendation was still given from doctor prior to discharge. CSW explained referral process, and will follow to facilitate discharge to SNF, if needed.  Employment status:  Retired Nurse, adult PT Recommendations:  [place], No Follow Up Information / Referral to community resources:  [place]  Patient/Family's Response to care:  Patient and patient's wife would like patient to go home, but will be agreeable to SNF placement if doctor continues to recommend and if insurance will cover.  Patient/Family's Understanding of and Emotional Response to Diagnosis, Current Treatment, and Prognosis:  Patient and patient's wife seem to understand the need for patient to receive appropriate care post-surgery so that the patient recovers well. Patient and patient's wife indicated understanding of CSW role in discharge planning.  Emotional Assessment Appearance:  Appears stated age Attitude/Demeanor/Rapport:    Affect (typically observed):  Appropriate Orientation:  Oriented to Situation, Oriented to  Time, Oriented to Place, Oriented to Self Alcohol / Substance use:  Not Applicable Psych involvement (Current and /or in the community):  No (Comment)  Discharge Needs  Concerns to be addressed:  Care Coordination, Discharge Planning Concerns Readmission within the last 30 days:  Yes Current discharge risk:  Physical Impairment Barriers to Discharge:  Continued Medical Work up   [employer], [place] 09/19/2016, 1:02 PM

## 2016-09-19 NOTE — Progress Notes (Signed)
CSW met with patient to discuss possible SNF placement. Patient not interested, but indicated that his wife had concerns and would like to discuss with her when she arrives to the hospital in approximately 45 minutes.   CSW will follow up to facilitate discharge planning discussion.  Laveda Abbe LCSW [phone]

## 2016-09-19 NOTE — Care Management Note (Signed)
Case Management Note  Patient Details  Name: QUINTAVIS BRANDS MRN: [id number] Date of Birth: 1945/04/12  Subjective/Objective:    Pt underwent:  WOUND EXPLORATION, GILL DECOMPRESSION,FAR LATERAL DISCECTOMY LEFT L4-L5, AND EVACUATION OF HEMATOMA. He is from home with his spouse.               Action/Plan: No f/u and no DME per PT. Awaiting new OT recommendations. CM following.  Expected Discharge Date:                  Expected Discharge Plan:  Home/Self Care  In-House Referral:     Discharge planning Services     Post Acute Care Choice:    Choice offered to:     DME Arranged:    DME Agency:     HH Arranged:    HH Agency:     Status of Service:  In process, will continue to follow  If discussed at Long Length of Stay Meetings, dates discussed:    Additional Comments:  Pollie Friar, RN 09/19/2016, 2:10 PM

## 2016-09-20 NOTE — Progress Notes (Signed)
Pt discharged at this time to care of wife via car.  Brace on and understands application.  He has all of his belongings with him.  Pt and wife verbalize understanding of discharge instructions.  I scheduled follow up appointment with Dr. Rolena Infante per their request. Dressing is clean, dry and intact.

## 2016-09-20 NOTE — Care Management Note (Signed)
Case Management Note  Patient Details  Name: Daniel Short MRN: [id number] Date of Birth: [birth year]  Subjective/Objective:                    Action/Plan: Patient discharging home with self care. No f/u and no DME needs per PT. Pt has PCP, insurance, and 24/7 care at home per his wife.  Pt also has transportation home.   Expected Discharge Date:  09/20/16               Expected Discharge Plan:  Home/Self Care  In-House Referral:     Discharge planning Services     Post Acute Care Choice:    Choice offered to:     DME Arranged:    DME Agency:     HH Arranged:    HH Agency:     Status of Service:  Completed, signed off  If discussed at [employer] of Stay Meetings, dates discussed:    Additional Comments:  Pollie Friar, RN 09/20/2016, 11:16 AM

## 2016-09-20 NOTE — Progress Notes (Signed)
    Subjective: Procedure(s) (LRB): WOUND EXPLORATION, GILL DECOMPRESSION,FAR LATERAL DISCECTOMY LEFT L4-L5, AND EVACUATION OF HEMATOMA (N/A) 3 Days Post-Op  Patient reports pain as 1 on 0-10 scale.  Reports none leg pain reports incisional back pain  - minimal Positive void Negative bowel movement Positive flatus Negative chest pain or shortness of breath  Objective: Vital signs in last 24 hours: Temp:  [97.5 F (36.4 C)-98.6 F (37 C)] 98.6 F (37 C) (06/26 0446) Pulse Rate:  [73-92] 75 (06/26 0446) Resp:  [18] 18 (06/26 0446) BP: (107-146)/(60-95) 121/95 (06/26 0446) SpO2:  [93 %-97 %] 95 % (06/26 0446)  Intake/Output from previous day: 06/25 0701 - 06/26 0700 In: -  Out: 645 [Urine:600; Drains:45]  Labs:  Recent Labs  09/17/16 0956  WBC 12.6*  RBC 3.94*  HCT 36.0*  PLT 173    Recent Labs  09/17/16 0956  NA 138  K 3.5  CL 100*  CO2 30  BUN 14  CREATININE 0.94  GLUCOSE 113*  CALCIUM 8.8*   No results for input(s): LABPT, INR in the last 72 hours.  Physical Exam: Neurologically intact ABD soft Dorsiflexion/Plantar flexion intact Incision: dressing C/D/I Compartment soft no radicular leg pain.  no focal motor/sensory deficits in LE  Drain: 25 each shift x2 (total 50) Assessment/Plan: Patient stable  xrays n/a Continue mobilization with physical therapy Continue care  Up with therapy  Drain output minimal over last 2 shifts Will d/c drain and plan on d/c to home later today Patient has meds at home     Melina Schools, [place] [phone]

## 2016-09-26 NOTE — Discharge Summary (Deleted)
  The note originally documented on this encounter has been moved the the encounter in which it belongs.  

## 2016-09-26 NOTE — Discharge Summary (Signed)
Patient ID: Daniel Short MRN: [id number] DOB/AGE: 72-Aug-1947 72 y.o.  Admit date: 09/17/2016 Discharge date: 09/20/16  Admission Diagnoses:  Active Problems:   Hematoma   Discharge Diagnoses:  Active Problems:   Hematoma  status post Procedure(s): WOUND EXPLORATION, GILL DECOMPRESSION,FAR LATERAL DISCECTOMY LEFT L4-L5, AND EVACUATION OF HEMATOMA  Past Medical History:  Diagnosis Date  . Arthritis   . CAD (coronary artery disease)    with stent placement  . Complication of anesthesia    wife states he woke up during heart stent,; with stroke he tried to put out IV'S  . Coronary artery arteriosclerosis    had stenting in past; not sure when  . CVA (cerebrovascular accident due to intracerebral hemorrhage) ([place])   . Dyspnea    when walking short distances  . Ear infection 1979   right ear due to wax build up , stayed in hospital several days  . Headache   . Heart attack [facility])    developed 3rd degree AVB, inferior ST elevation following 02/15/02 LHC showing 70% RCA--> s/p temporary PPM and proximal/ostial RCA Zeta stents   . Stroke [facility]) 2000   cva due to intracerebral hemorrhage    Surgeries: Procedure(s): WOUND EXPLORATION, GILL DECOMPRESSION,FAR LATERAL DISCECTOMY LEFT L4-L5, AND EVACUATION OF HEMATOMA on 09/17/2016   Consultants: Treatment Team:  Melina Schools, MD  Discharged Condition: Improved  Hospital Course: Daniel Short is an 71 y.o. male who was admitted 09/17/2016 for operative treatment of <principal problem not specified>. Patient failed conservative treatments (please see the history and physical for the specifics) and had severe unremitting pain that affects sleep, daily activities and work/hobbies. After pre-op clearance, the patient was taken to the operating room on 09/17/2016 and underwent  Procedure(s): WOUND EXPLORATION, GILL [place] DISCECTOMY LEFT L4-L5, AND EVACUATION OF HEMATOMA.  Post op day 3 pt reports low level of  pain controlled on oral meds. Pt is voiding.  Pt is ambulating in hallway. Pt was not approved for SNF placement.  Drain was pulled after minimal output for 2 nursing shifts.  Pt cleared by PT.   Patient was given perioperative antibiotics:  Anti-infectives    Start     Dose/Rate Route Frequency Ordered Stop   09/18/16 0900  ceFAZolin (ANCEF) IVPB 2g/100 mL premix     2 g 200 mL/hr over 30 Minutes Intravenous Every 8 hours 09/18/16 0809 09/20/16 0302   09/17/16 2330  ceFAZolin (ANCEF) IVPB 2g/100 mL premix     2 g 200 mL/hr over 30 Minutes Intravenous Every 8 hours 09/17/16 2041 09/18/16 0653   09/17/16 1530  ceFAZolin (ANCEF) IVPB 2g/100 mL premix     2 g 200 mL/hr over 30 Minutes Intravenous  Once 09/17/16 1528 09/17/16 1627       Patient was given sequential compression devices and early ambulation to prevent DVT.   Patient benefited maximally from hospital stay and there were no complications. At the time of discharge, the patient was urinating/moving their bowels without difficulty, tolerating a regular diet, pain is controlled with oral pain medications and they have been cleared by PT/OT.   Recent vital signs: No data found.    Recent laboratory studies: No results for input(s): WBC, HGB, HCT, PLT, NA, K, CL, CO2, BUN, CREATININE, GLUCOSE, INR, CALCIUM in the last 72 hours.  Invalid input(s): PT, 2   Discharge Medications:   Allergies as of 09/20/2016      Reactions   No Known Allergies  Medication List    STOP taking these medications   pregabalin 75 MG capsule Commonly known as:  LYRICA     TAKE these medications   aspirin 325 MG tablet Take 325 mg by mouth daily.   atorvastatin 20 MG tablet Commonly known as:  LIPITOR Take 1 tablet (20 mg total) by mouth daily.   hydrochlorothiazide 25 MG tablet Commonly known as:  HYDRODIURIL Take 1 tablet (25 mg total) by mouth daily.   methocarbamol 500 MG tablet Commonly known as:  ROBAXIN Take 1 tablet (500  mg total) by mouth every 6 (six) hours as needed for muscle spasms.   ondansetron 4 MG disintegrating tablet Commonly known as:  ZOFRAN ODT Take 1 tablet (4 mg total) by mouth every 8 (eight) hours as needed for nausea or vomiting.     ASK your doctor about these medications   Oxycodone HCl 10 MG Tabs Take 1 tablet (10 mg total) by mouth every 4 (four) hours as needed for severe pain. Ask about: Should I take this medication?       Diagnostic Studies: Dg Chest 2 View  Result Date: 08/30/2016 CLINICAL DATA:  Cough, shortness of breath and weakness for 1 day, history smoking, coronary artery disease EXAM: CHEST  2 VIEW COMPARISON:  02/17/2002 FINDINGS: Enlargement of cardiac silhouette. Mediastinal contours and pulmonary vascularity normal. Lungs hyperinflated but clear. No pulmonary infiltrate, pleural effusion, or pneumothorax. Bones unremarkable. IMPRESSION: Enlargement of cardiac silhouette. Hyperinflation without acute infiltrate Electronically Signed   By: Lavonia Dana M.D.   On: 08/30/2016 16:57   Dg Lumbar Spine 2-3 Views  Result Date: 09/14/2016 CLINICAL DATA:  Intraoperative localization. EXAM: LUMBAR SPINE - 2-3 VIEW COMPARISON:  Lumbar spine radiographs May 19, 2016 and the lumbar spine localization September 23, 2006 at 1522 hours FINDINGS: Lateral radiograph for localization labeled "#1" at 1502 hours: Surgical instrument indicates the spinous process of L2 and L3. Lateral radiograph for localization labeled "#2" at 1705 hours: Surgical instrument indicates the posterior elements at L3-4 and L5-S1 on radiograph labeled #2 using the reference of last well-formed intervertebral disc space at L5-S1. IMPRESSION: Lateral radiograph for localization, surgical instruments span L3-4 through L5-S1 posterior elements. Findings called to the operating room and relayed to Dr. Rolena Infante on September 14, 2016 at 1725 hours. Electronically Signed   By: Elon Alas M.D.   On: 09/14/2016 17:30   Mr  Lumbar Spine W Wo Contrast  Result Date: 09/17/2016 CLINICAL DATA:  Severe increase in posterior LEFT knee pain. EXAM: MRI LUMBAR SPINE WITHOUT AND WITH CONTRAST TECHNIQUE: Multiplanar and multiecho pulse sequences of the lumbar spine were obtained without and with intravenous contrast. CONTRAST:  43mL MULTIHANCE GADOBENATE DIMEGLUMINE 529 MG/ML IV SOLN COMPARISON:  The patient's preoperative MRI is not available for assessment and comparison. Intraoperative plain films were performed 09/14/2016. Plain films from 05/19/2016 are reviewed, but this numbering scheme is not employed. FINDINGS: Segmentation: Standard numbering scheme. Numbering used today is identical with that which was assessed at time of surgery. Alignment:  Anatomic Vertebrae:  No worrisome osseous lesion Conus medullaris: Extends to the L1 level and appears normal. Paraspinal and other soft tissues: Paraspinous muscle edema is an expected postoperative finding. Normal-appearing aorta and kidneys. Disc levels: L1-L2:  Normal. L2-L3: Congenital and acquired stenosis with short pedicles. Facet arthropathy. Small 3 x 4 mm subdural collection dorsally, likely postoperative effusion. L3-L4: Posterior decompression with spinous process removal. Central protrusion, but no apparent previous discectomy. T2 hyperintense fluid without significant enhancement fills  the laminectomy site. Severe stenosis, with LEFT greater than RIGHT thecal sac compression and displacement ventrally. L4-L5: Status post laminectomy, spinous process removal, and in situ fusion. Critical stenosis. Near complete compression of the thecal sac ventrally. T2 hyperintense fluid extends to the subarticular zone on the LEFT, see image 26 series 7. Large fluid collection fills the laminectomy site, cross-section 24 x 28 x 46 mm. Findings are consistent with a CSF leak from the LEFT L5 nerve root. Postoperative seroma or hematoma not excluded. There is a large disc extrusion in the LEFT  foraminal and extraforaminal compartment. Some disc material on the LEFT subarticular zone. See axial image 24, and sagittal images 11-12, all series. Severe LEFT L4 nerve root impingement. L5-S1:  Unremarkable. IMPRESSION: The patient's LEFT knee pain likely derives from a large leftward foraminal and extraforaminal disc extrusion at L4-5. Severe LEFT L4 nerve root impingement is present. I am unable to assess interval change from preoperative MR. Critical stenosis at L4-5 due to a large T2 hyperintense fluid collection at the laminectomy site, measuring 24 x 28 x 46 mm. Suspect CSF leak originating from the LEFT L5 nerve root although postoperative seroma not excluded. Severe additional stenosis at L3-4 due to compression by the large T2 hyperintense fluid collection. Small subdural fluid collection extends dorsally as far as the L2-L3 interspace, an additional finding suggesting a dural tear. These results were called by telephone at the time of interpretation on 09/17/2016 at 2:15 pm to Dr. Brantley Stage , who verbally acknowledged these results. Electronically Signed   By: Staci Righter M.D.   On: 09/17/2016 14:16   Dg Lumbar Spine 1 View  Result Date: 09/17/2016 CLINICAL DATA:  Spinal surgery level localization EXAM: LUMBAR SPINE - 1 VIEW COMPARISON:  None. FINDINGS: The tip of the probe overlies the L4-5 level neural foramen. IMPRESSION: Probe tip overlying the L4 neural foramen. These results were called by telephone by Dr. Rolla Flatten at the time of interpretation on 09/17/2016 at 5:48 pm to the operating room. Electronically Signed   By: Ulyses Jarred M.D.   On: 09/17/2016 17:47   Dg Lumbar Spine 1 View  Result Date: 09/14/2016 CLINICAL DATA:  Decompression L3-L5, fusion L4-5. EXAM: LUMBAR SPINE - 1 VIEW COMPARISON:  05/19/2016 FINDINGS: Posterior surgical instruments are directed at the L4-5 disc space. This numbering is different than on prior plain film. I discussed the case with Dr. Rolena Infante who agrees  with my numbering as this is the same numbering scheme that was performed on outside MRI. IMPRESSION: Intraoperative localization as above. Electronically Signed   By: Rolm Baptise M.D.   On: 09/14/2016 15:31    Discharge Instructions    Incentive spirometry RT    Complete by:  As directed       Follow-up Information    Melina Schools, MD. Schedule an appointment as soon as possible for a visit in 2 week(s).   Specialty:  Orthopedic Surgery Contact information: [address] [place] [address] [place] [address] [phone]           Discharge Plan:  discharge to home Pt will present to clinic in 2 weeks Pt has post op meds at home   Disposition:     Signed: Valinda Hoar for Dr. Melina Schools [facility] [phone] 09/26/2016, 11:39 AM

## 2016-10-10 DIAGNOSIS — Z029 Encounter for administrative examinations, unspecified: Secondary | ICD-10-CM

## 2016-10-18 NOTE — Discharge Summary (Deleted)
  The note originally documented on this encounter has been moved the the encounter in which it belongs.  

## 2016-10-18 NOTE — Discharge Summary (Signed)
Physician Discharge Summary  Patient ID: Daniel Short MRN: [id number] DOB/AGE: 72-Jan-1947 72 y.o.  Admit date: 09/17/2016 Discharge date: 09/16/16  Admission Diagnoses:  Grade 1 slip lumbar 4-5  Discharge Diagnoses:  Active Problems:   Hematoma   Past Medical History:  Diagnosis Date  . Arthritis   . CAD (coronary artery disease)    with stent placement  . Complication of anesthesia    wife states he woke up during heart stent,; with stroke he tried to put out IV'S  . Coronary artery arteriosclerosis    had stenting in past; not sure when  . CVA (cerebrovascular accident due to intracerebral hemorrhage) ([place])   . Dyspnea    when walking short distances  . Ear infection 1979   right ear due to wax build up , stayed in hospital several days  . Headache   . Heart attack [facility])    developed 3rd degree AVB, inferior ST elevation following 02/15/02 LHC showing 70% RCA--> s/p temporary PPM and proximal/ostial RCA Zeta stents   . Stroke [facility]) 2000   cva due to intracerebral hemorrhage    Surgeries: Procedure(s): WOUND EXPLORATION, GILL DECOMPRESSION,FAR LATERAL DISCECTOMY LEFT L4-L5, AND EVACUATION OF HEMATOMA on 09/17/2016   Consultants (if any): Treatment Team:  Melina Schools, MD  Discharged Condition: Improved  Hospital Course: Daniel Short is an 71 y.o. male who was admitted 09/17/2016 with a diagnosis of Grade 1 slip Lumbar 4-5 and stenosis L3-5 and went to the operating room on 09/17/2016 and underwent the above named procedures.  Post op pts reports moderate incisional pain.  Pt did start voiding after foley was removed.  His drain was not removed till later post op day 1.  Pt was ambulating and urinating post op day 2.  Cleared by PT for DC.   He was given perioperative antibiotics:  Anti-infectives    Start     Dose/Rate Route Frequency Ordered Stop   09/18/16 0900  ceFAZolin (ANCEF) IVPB 2g/100 mL premix     2 g 200 mL/hr over 30 Minutes Intravenous Every 8  hours 09/18/16 0809 09/20/16 0302   09/17/16 2330  ceFAZolin (ANCEF) IVPB 2g/100 mL premix     2 g 200 mL/hr over 30 Minutes Intravenous Every 8 hours 09/17/16 2041 09/18/16 0653   09/17/16 1530  ceFAZolin (ANCEF) IVPB 2g/100 mL premix     2 g 200 mL/hr over 30 Minutes Intravenous  Once 09/17/16 1528 09/17/16 1627    .  He was given sequential compression devices, early ambulation, and TED for DVT prophylaxis.  He benefited maximally from the hospital stay and there were no complications.    Recent vital signs:  Vitals:   09/20/16 0446 09/20/16 0958  BP: (!) 121/95 114/66  Pulse: 75 71  Resp: 18 20  Temp: 98.6 F (37 C) 97.7 F (36.5 C)    Recent laboratory studies:  Lab Results  Component Value Date   HGB 11.9 (L) 09/17/2016   HGB 13.9 09/14/2016   HGB 16.6 09/07/2016   Lab Results  Component Value Date   WBC 12.6 (H) 09/17/2016   PLT 173 09/17/2016   No results found for: INR Lab Results  Component Value Date   NA 138 09/17/2016   K 3.5 09/17/2016   CL 100 (L) 09/17/2016   CO2 30 09/17/2016   BUN 14 09/17/2016   CREATININE 0.94 09/17/2016   GLUCOSE 113 (H) 09/17/2016    Discharge Medications:   Allergies as of 09/20/2016  Reactions   No Known Allergies       Medication List    STOP taking these medications   pregabalin 75 MG capsule Commonly known as:  LYRICA     TAKE these medications   aspirin 325 MG tablet Take 325 mg by mouth daily.   atorvastatin 20 MG tablet Commonly known as:  LIPITOR Take 1 tablet (20 mg total) by mouth daily.   hydrochlorothiazide 25 MG tablet Commonly known as:  HYDRODIURIL Take 1 tablet (25 mg total) by mouth daily.   methocarbamol 500 MG tablet Commonly known as:  ROBAXIN Take 1 tablet (500 mg total) by mouth every 6 (six) hours as needed for muscle spasms.   ondansetron 4 MG disintegrating tablet Commonly known as:  ZOFRAN ODT Take 1 tablet (4 mg total) by mouth every 8 (eight) hours as needed for  nausea or vomiting.     ASK your doctor about these medications   Oxycodone HCl 10 MG Tabs Take 1 tablet (10 mg total) by mouth every 4 (four) hours as needed for severe pain. Ask about: Should I take this medication?       Diagnostic Studies: No results found.  Disposition: 01-Home or Self Care Post op meds provided Pt will present to clinic in 2 weeks  Discharge Instructions    Incentive spirometry RT    Complete by:  As directed       Follow-up Information    Melina Schools, MD. Schedule an appointment as soon as possible for a visit in 2 week(s).   Specialty:  Orthopedic Surgery Contact information: [address] [place] [address] [phone]            Signed: Valinda Hoar 10/18/2016, 1:56 PM

## 2016-10-31 ENCOUNTER — Telehealth: Payer: Self-pay | Admitting: Family Medicine

## 2016-11-01 NOTE — Telephone Encounter (Signed)
Will ask clarification.   Laroy Apple, MD [facility] 11/01/2016, 2:40 PM

## 2016-11-01 NOTE — Telephone Encounter (Signed)
Dr Lannette Donath and if he has already been to them they set that up?????

## 2016-11-15 ENCOUNTER — Ambulatory Visit: Payer: Medicare HMO | Attending: Orthopedic Surgery | Admitting: Physical Therapy

## 2016-11-15 ENCOUNTER — Encounter: Payer: Self-pay | Admitting: Physical Therapy

## 2016-11-15 DIAGNOSIS — R293 Abnormal posture: Secondary | ICD-10-CM | POA: Diagnosis not present

## 2016-11-15 DIAGNOSIS — G8929 Other chronic pain: Secondary | ICD-10-CM | POA: Diagnosis not present

## 2016-11-15 DIAGNOSIS — M545 Low back pain: Secondary | ICD-10-CM | POA: Diagnosis not present

## 2016-11-15 NOTE — Therapy (Signed)
[facility] [place], [place], [address] Phone: [phone]   Fax:  [phone]  Physical Therapy Evaluation  Patient Details  Name: Daniel Short MRN: [id number] Date of Birth: Oct 21, 1945 Referring Provider: Melina Schools MD.  Encounter Date: 11/15/2016      PT End of Session - 11/15/16 1130    Visit Number 1   Number of Visits 12   Date for PT Re-Evaluation 01/14/17   PT Start Time 2423   PT Stop Time 1151   PT Time Calculation (min) 49 min      Past Medical History:  Diagnosis Date  . Arthritis   . CAD (coronary artery disease)    with stent placement  . Complication of anesthesia    wife states he woke up during heart stent,; with stroke he tried to put out IV'S  . Coronary artery arteriosclerosis    had stenting in past; not sure when  . CVA (cerebrovascular accident due to intracerebral hemorrhage) ([place])   . Dyspnea    when walking short distances  . Ear infection 1979   right ear due to wax build up , stayed in hospital several days  . Headache   . Heart attack [facility])    developed 3rd degree AVB, inferior ST elevation following 02/15/02 LHC showing 70% RCA--> s/p temporary PPM and proximal/ostial RCA Zeta stents   . Stroke [facility]) 2000   cva due to intracerebral hemorrhage    Past Surgical History:  Procedure Laterality Date  . CORONARY STENT PLACEMENT  2005  . HERNIA REPAIR    . KNEE ARTHROSCOPY Right   . LUMBAR LAMINECTOMY/DECOMPRESSION MICRODISCECTOMY N/A 09/14/2016   Procedure: Decompression L3-5, insitu fusion L4-5;  Surgeon: Melina Schools, MD;  Location: [place];  Service: Orthopedics;  Laterality: N/A;  3 hrs  . LUMBAR LAMINECTOMY/DECOMPRESSION MICRODISCECTOMY N/A 09/17/2016   Procedure: WOUND EXPLORATION, GILL DECOMPRESSION,FAR LATERAL DISCECTOMY LEFT L4-L5, AND EVACUATION OF HEMATOMA;  Surgeon: Melina Schools, MD;  Location: [place];  Service: Orthopedics;  Laterality: N/A;    There were no  vitals filed for this visit.       Subjective Assessment - 11/15/16 1139    Subjective The patient is s/p lumbar surgery performed on 09/14/16 and then again for hematoma removal on 09/17/16.  he prsents to the clinic today with his lumbar corset donned.  He reports some remaining left LE symptoms past his knee, left groin pain and pain across his lower back but he is better since surgery.  He reported his pain-level upon rising today was a 7/10.  Rest decreases pain and certain movements increase pain.   Pertinent History CVA;MI.   Limitations Walking   How long can you walk comfortably? Short distances around house.   Patient Stated Goals Be independent again.   Currently in Pain? Yes   Pain Score 7    Pain Location Back   Pain Orientation Mid;Lower;Left   Pain Descriptors / Indicators Aching   Pain Type Surgical pain   Pain Onset More than a month ago   Pain Frequency Constant   Aggravating Factors  See above.   Pain Relieving Factors See above.            [facility] PT Assessment - 11/15/16 0001      Assessment   Medical Diagnosis Spinal stenosis of lumbar region.   Referring Provider Melina Schools MD.   Onset Date/Surgical Date --  09/14/16 (1st surgery).     Precautions   Precautions --  Please follow lumbar fusion protocol.   Required Braces or Orthoses --  Lumbar brace.     Restrictions   Weight Bearing Restrictions No     Balance Screen   Has the patient fallen in the past 6 months No   Has the patient had a decrease in activity level because of a fear of falling?  No   Is the patient reluctant to leave their home because of a fear of falling?  No     Home Environment   Living Environment Private residence     Prior Function   Level of Independence Independent     Observation/Other Assessments   Observations Lumbar incision looks to be healing well.     Posture/Postural Control   Posture Comments Decreased lumbar lordosis.     ROM / Strength   AROM /  PROM / Strength AROM;Strength     AROM   Overall AROM Comments In supine right hip flexion= 100 degrees and left hip flexion= 105 degrees.     Strength   Overall Strength Comments Normal bilateral LE knee and ankle strength.     Palpation   Palpation comment Mild tenderness on either side of his lumbar incision with increased tone of right erector spinae musculature.     Ambulation/Gait   Gait Comments Slow and purposeful gait with brace donned with decrease stride length.            Objective measurements completed on examination: See above findings.          [facility] Adult PT Treatment/Exercise - 11/15/16 0001      Modalities   Modalities Electrical Stimulation;Moist Heat     Moist Heat Therapy   Number Minutes Moist Heat 20 Minutes   Moist Heat Location --  Lumbar spine.     Acupuncturist Location Lumbar.   Electrical Stimulation Action IFC   Electrical Stimulation Parameters 80-150 Hz at 100% scan x 20 minutes.   Electrical Stimulation Goals Tone;Pain                  PT Short Term Goals - 11/15/16 1417      PT SHORT TERM GOAL #1   Title STG's=LTG's.           PT Long Term Goals - 11/15/16 1418      PT LONG TERM GOAL #1   Title Independent with a HEP.   Time 4   Period Weeks   Status New     PT LONG TERM GOAL #2   Title Walk a community distance with pain not > 3/10.   Time 4   Period Weeks   Status New     PT LONG TERM GOAL #3   Title Eliminate left LE symptoms.   Time 4   Period Weeks   Status New     PT LONG TERM GOAL #4   Title Perform ADL's with pain not > 3/10.   Time 4   Period Weeks   Status New     PT LONG TERM GOAL #5   Title Bilateral hip flexion in supine to 115 degrees.   Time 8   Period Weeks   Status New                Plan - 11/15/16 1337    Clinical Impression Statement The patient presents to [facility] s/p lumbar surgery x 2 (6/20 and 09/17/16).  He is doing well  since surgery but has  a lot of pain upon rising in the morning and continued left LE symptoms/pain past the level of his knee.  His bilateral knee and ankle strength is normal.  His gait is slow and purposeful.  recent surgical intervention and deficts has impaired his functional mobility.  Patient will benefit from skilled physical therapy with guidance through a lumbar fusion protocol.     History and Personal Factors relevant to plan of care: Long h/o low back pain.   Clinical Presentation Stable   Clinical Presentation due to: Good surgical outcome.   Clinical Decision Making Low   Rehab Potential Good   PT Frequency 3x / week   PT Duration 4 weeks   PT Treatment/Interventions ADLs/Self Care Home Management;Cryotherapy;Electrical Stimulation;Moist Heat;Ultrasound;Functional mobility training;Therapeutic activities;Therapeutic exercise;Patient/family education;Manual techniques   PT Next Visit Plan Begin with draw-ins; mini-crunches; low-level hip bridges and SKTC.  Nustep.  Modalites PRN.  No spinal loading.  Spinal motion contraindications in place.   Consulted and Agree with Plan of Care Patient      Patient will benefit from skilled therapeutic intervention in order to improve the following deficits and impairments:  Decreased activity tolerance, Decreased mobility, Decreased range of motion, Postural dysfunction  Visit Diagnosis: Chronic left-sided low back pain, with sciatica presence unspecified - Plan: PT plan of care cert/re-cert  Abnormal posture - Plan: PT plan of care cert/re-cert      G-Codes - 48/88/91 1131    Functional Assessment Tool Used (Outpatient Only) Clinical judgement...   Functional Limitation Mobility: Walking and moving around   Mobility: Walking and Moving Around Current Status [phone]) At least 40 percent but less than 60 percent impaired, limited or restricted   Mobility: Walking and Moving Around Goal Status [phone]) At least 20 percent but less than 40  percent impaired, limited or restricted       Problem List Patient Active Problem List   Diagnosis Date Noted  . Hematoma 09/17/2016  . Status post lumbar surgery 09/14/2016  . Back pain 09/14/2016  . Preoperative clearance 09/07/2016  . HTN (hypertension) 05/19/2016  . Healthcare maintenance 04/16/2015  . Obesity 11/24/2014  . Tobacco abuse 11/24/2014  . Annual physical exam 11/24/2014  . CAD (coronary artery disease), native coronary artery 11/27/2013  . HLD (hyperlipidemia) 11/27/2013    Rome Schlauch, [place] MPT 11/15/2016, 2:21 PM  [facility] [place], [place], [address] Phone: [phone]   Fax:  [phone]  Name: Daniel Short MRN: [id number] Date of Birth: May 26, 1945

## 2016-11-17 ENCOUNTER — Ambulatory Visit: Payer: Medicare HMO | Admitting: Physical Therapy

## 2016-11-17 DIAGNOSIS — M545 Low back pain: Principal | ICD-10-CM

## 2016-11-17 DIAGNOSIS — R293 Abnormal posture: Secondary | ICD-10-CM | POA: Diagnosis not present

## 2016-11-17 DIAGNOSIS — G8929 Other chronic pain: Secondary | ICD-10-CM

## 2016-11-17 NOTE — Therapy (Signed)
[facility] [place], [place], [address] Phone: [phone]   Fax:  [phone]  Physical Therapy Treatment  Patient Details  Name: Daniel Short MRN: [id number] Date of Birth: 1945/04/28 Referring Provider: Melina Schools MD.  Encounter Date: 11/17/2016      PT End of Session - 11/17/16 1335    Visit Number 2   Number of Visits 12   Date for PT Re-Evaluation 01/14/17   PT Start Time 1301   PT Stop Time 8250   PT Time Calculation (min) 46 min   Activity Tolerance Patient tolerated treatment well   Behavior During Therapy [facility] for tasks assessed/performed      Past Medical History:  Diagnosis Date  . Arthritis   . CAD (coronary artery disease)    with stent placement  . Complication of anesthesia    wife states he woke up during heart stent,; with stroke he tried to put out IV'S  . Coronary artery arteriosclerosis    had stenting in past; not sure when  . CVA (cerebrovascular accident due to intracerebral hemorrhage) ([place])   . Dyspnea    when walking short distances  . Ear infection 1979   right ear due to wax build up , stayed in hospital several days  . Headache   . Heart attack [facility])    developed 3rd degree AVB, inferior ST elevation following 02/15/02 LHC showing 70% RCA--> s/p temporary PPM and proximal/ostial RCA Zeta stents   . Stroke [facility]) 2000   cva due to intracerebral hemorrhage    Past Surgical History:  Procedure Laterality Date  . CORONARY STENT PLACEMENT  2005  . HERNIA REPAIR    . KNEE ARTHROSCOPY Right   . LUMBAR LAMINECTOMY/DECOMPRESSION MICRODISCECTOMY N/A 09/14/2016   Procedure: Decompression L3-5, insitu fusion L4-5;  Surgeon: Melina Schools, MD;  Location: [place];  Service: Orthopedics;  Laterality: N/A;  3 hrs  . LUMBAR LAMINECTOMY/DECOMPRESSION MICRODISCECTOMY N/A 09/17/2016   Procedure: WOUND EXPLORATION, GILL DECOMPRESSION,FAR LATERAL DISCECTOMY LEFT L4-L5, AND EVACUATION OF  HEMATOMA;  Surgeon: Melina Schools, MD;  Location: [place];  Service: Orthopedics;  Laterality: N/A;    There were no vitals filed for this visit.      Subjective Assessment - 11/17/16 1305    Subjective Patient arrived with minimal soreness and wearing back brace   Patient is accompained by: Family member   Pertinent History CVA;MI.   Limitations Walking   How long can you walk comfortably? Short distances around house.   Currently in Pain? Yes   Pain Score 2   0-2/10 with meds up to 10/10 without meds   Pain Location Back   Pain Orientation Left;Mid;Lower   Pain Descriptors / Indicators Sharp;Shooting;Discomfort   Pain Type Surgical pain   Pain Radiating Towards left leg posterior to calf   Pain Onset More than a month ago   Aggravating Factors  no meds   Pain Relieving Factors meds                         [facility] Adult PT Treatment/Exercise - 11/17/16 0001      Self-Care   Self-Care ADL's;Posture;Lifting   ADL's light ADL's brushing teeth/wash face/dressing   Lifting no lifting until MD approval   Posture all positions to protect back     Exercises   Exercises Lumbar     Lumbar Exercises: Aerobic   Stationary Bike nustep L2 x23min     Lumbar Exercises:  Supine   Ab Set 20 reps;3 seconds   Glut Set 20 reps;3 seconds   Clam 20 reps;3 seconds   Bent Knee Raise 3 seconds  2x20     Moist Heat Therapy   Number Minutes Moist Heat 15 Minutes   Moist Heat Location Lumbar Spine     Electrical Stimulation   Electrical Stimulation Location Lumbar.   Electrical Stimulation Action IFC   Electrical Stimulation Parameters 80-150hz  x62min   Electrical Stimulation Goals Tone;Pain                  PT Short Term Goals - 11/15/16 1417      PT SHORT TERM GOAL #1   Title STG's=LTG's.           PT Long Term Goals - 11/15/16 1418      PT LONG TERM GOAL #1   Title Independent with a HEP.   Time 4   Period Weeks   Status New     PT LONG TERM  GOAL #2   Title Walk a community distance with pain not > 3/10.   Time 4   Period Weeks   Status New     PT LONG TERM GOAL #3   Title Eliminate left LE symptoms.   Time 4   Period Weeks   Status New     PT LONG TERM GOAL #4   Title Perform ADL's with pain not > 3/10.   Time 4   Period Weeks   Status New     PT LONG TERM GOAL #5   Title Bilateral hip flexion in supine to 115 degrees.   Time 8   Period Weeks   Status New               Plan - 11/17/16 1336    Clinical Impression Statement Patient tolerated treatment well today. Patient and wife were educated on healing/protocol per MD orders, posture awareness techniques, no BLT and core strengtheing. Patient has good understanding of core activation today. Patient has some difficulty with log roll techique today and will practice form. Current goals ongoing due to pain and strength deficts.    Rehab Potential Good   Clinical Impairments Affecting Rehab Potential last surgery 09/17/16 current 9 weeks 11/19/16   PT Frequency 3x / week   PT Duration 4 weeks   PT Treatment/Interventions ADLs/Self Care Home Management;Cryotherapy;Electrical Stimulation;Moist Heat;Ultrasound;Functional mobility training;Therapeutic activities;Therapeutic exercise;Patient/family education;Manual techniques   PT Next Visit Plan cont with draw-ins; mini-crunches; low-level hip bridges and SKTC.  Nustep.  Modalites PRN.  No spinal loading.  Spinal motion contraindications in place. MD Rolena Infante 12/09/16   PT Home Exercise Plan issue HEP nest treatment   Consulted and Agree with Plan of Care Patient;Family member/caregiver      Patient will benefit from skilled therapeutic intervention in order to improve the following deficits and impairments:  Decreased activity tolerance, Decreased mobility, Decreased range of motion, Postural dysfunction  Visit Diagnosis: Chronic left-sided low back pain, with sciatica presence unspecified  Abnormal  posture     Problem List Patient Active Problem List   Diagnosis Date Noted  . Hematoma 09/17/2016  . Status post lumbar surgery 09/14/2016  . Back pain 09/14/2016  . Preoperative clearance 09/07/2016  . HTN (hypertension) 05/19/2016  . Healthcare maintenance 04/16/2015  . Obesity 11/24/2014  . Tobacco abuse 11/24/2014  . Annual physical exam 11/24/2014  . CAD (coronary artery disease), native coronary artery 11/27/2013  . HLD (hyperlipidemia) 11/27/2013  Phillips Climes, PTA 11/17/2016, 1:51 PM  [facility] [address] [place], [place], [address] Phone: [phone]   Fax:  [phone]  Name: ANVITH MAURIELLO MRN: [id number] Date of Birth: [birth year]

## 2016-11-22 ENCOUNTER — Ambulatory Visit: Payer: Medicare HMO | Admitting: *Deleted

## 2016-11-22 DIAGNOSIS — R293 Abnormal posture: Secondary | ICD-10-CM | POA: Diagnosis not present

## 2016-11-22 DIAGNOSIS — M545 Low back pain: Principal | ICD-10-CM

## 2016-11-22 DIAGNOSIS — G8929 Other chronic pain: Secondary | ICD-10-CM

## 2016-11-22 NOTE — Therapy (Signed)
[facility] [place], [place], [address] Phone: [phone]   Fax:  [phone]  Physical Therapy Treatment  Patient Details  Name: Daniel Short MRN: [id number] Date of Birth: Aug 08, 1945 Referring Provider: Melina Schools MD.  Encounter Date: 11/22/2016      PT End of Session - 11/22/16 1208    Visit Number 3   Number of Visits 12   Date for PT Re-Evaluation 01/14/17   PT Start Time 1115   PT Stop Time 4656   PT Time Calculation (min) 50 min      Past Medical History:  Diagnosis Date  . Arthritis   . CAD (coronary artery disease)    with stent placement  . Complication of anesthesia    wife states he woke up during heart stent,; with stroke he tried to put out IV'S  . Coronary artery arteriosclerosis    had stenting in past; not sure when  . CVA (cerebrovascular accident due to intracerebral hemorrhage) ([place])   . Dyspnea    when walking short distances  . Ear infection 1979   right ear due to wax build up , stayed in hospital several days  . Headache   . Heart attack [facility])    developed 3rd degree AVB, inferior ST elevation following 02/15/02 LHC showing 70% RCA--> s/p temporary PPM and proximal/ostial RCA Zeta stents   . Stroke [facility]) 2000   cva due to intracerebral hemorrhage    Past Surgical History:  Procedure Laterality Date  . CORONARY STENT PLACEMENT  2005  . HERNIA REPAIR    . KNEE ARTHROSCOPY Right   . LUMBAR LAMINECTOMY/DECOMPRESSION MICRODISCECTOMY N/A 09/14/2016   Procedure: Decompression L3-5, insitu fusion L4-5;  Surgeon: Melina Schools, MD;  Location: [place];  Service: Orthopedics;  Laterality: N/A;  3 hrs  . LUMBAR LAMINECTOMY/DECOMPRESSION MICRODISCECTOMY N/A 09/17/2016   Procedure: WOUND EXPLORATION, Daniel DECOMPRESSION,FAR LATERAL DISCECTOMY LEFT L4-L5, AND EVACUATION OF HEMATOMA;  Surgeon: Melina Schools, MD;  Location: [place];  Service: Orthopedics;  Laterality: N/A;    There were no  vitals filed for this visit.      Subjective Assessment - 11/22/16 1117    Subjective Patient arrived with minimal soreness and wearing back brace   Patient is accompained by: Family member   Pertinent History CVA;MI.   Limitations Walking   How long can you walk comfortably? Short distances around house.   Patient Stated Goals Be independent again.                         [facility] Adult PT Treatment/Exercise - 11/22/16 0001      Exercises   Exercises Lumbar     Lumbar Exercises: Aerobic   Stationary Bike nustep L2 x12 minsmin     Lumbar Exercises: Supine   Ab Set 20 reps;3 seconds   Bent Knee Raise 3 seconds  2x20     Modalities   Modalities Electrical Stimulation;Moist Heat     Moist Heat Therapy   Number Minutes Moist Heat 15 Minutes   Moist Heat Location Lumbar Spine     Electrical Stimulation   Electrical Stimulation Location Lumbar.  IFC 80-150 hz x 15 mins   Electrical Stimulation Goals Tone;Pain                  PT Short Term Goals - 11/15/16 1417      PT SHORT TERM GOAL #1   Title STG's=LTG's.  PT Long Term Goals - 11/15/16 1418      PT LONG TERM GOAL #1   Title Independent with a HEP.   Time 4   Period Weeks   Status New     PT LONG TERM GOAL #2   Title Walk a community distance with pain not > 3/10.   Time 4   Period Weeks   Status New     PT LONG TERM GOAL #3   Title Eliminate left LE symptoms.   Time 4   Period Weeks   Status New     PT LONG TERM GOAL #4   Title Perform ADL's with pain not > 3/10.   Time 4   Period Weeks   Status New     PT LONG TERM GOAL #5   Title Bilateral hip flexion in supine to 115 degrees.   Time 8   Period Weeks   Status New               Plan - 11/22/16 1128    Clinical Impression Statement Pt arrived today doing fairly well, but still with quarded movements due to pain. We reviewed drawin and core activation exs and body mechanics for transitional  movements. He still needs practice with drawin and not holding his breath while performing  exs.. normal modality response   Rehab Potential Good   Clinical Impairments Affecting Rehab Potential last surgery 09/17/16 current 9 weeks 11/19/16   PT Frequency 3x / week   PT Duration 4 weeks   PT Treatment/Interventions ADLs/Self Care Home Management;Cryotherapy;Electrical Stimulation;Moist Heat;Ultrasound;Functional mobility training;Therapeutic activities;Therapeutic exercise;Patient/family education;Manual techniques   PT Next Visit Plan cont with draw-ins; mini-crunches; low-level hip bridges and SKTC.  Nustep.  Modalites PRN.  No spinal loading.  Spinal motion contraindications in place. MD Rolena Infante 12/09/16   PT Home Exercise Plan issue HEP nest treatment   Consulted and Agree with Plan of Care Patient;Family member/caregiver      Patient will benefit from skilled therapeutic intervention in order to improve the following deficits and impairments:  Decreased activity tolerance, Decreased mobility, Decreased range of motion, Postural dysfunction  Visit Diagnosis: Chronic left-sided low back pain, with sciatica presence unspecified  Abnormal posture     Problem List Patient Active Problem List   Diagnosis Date Noted  . Hematoma 09/17/2016  . Status post lumbar surgery 09/14/2016  . Back pain 09/14/2016  . Preoperative clearance 09/07/2016  . HTN (hypertension) 05/19/2016  . Healthcare maintenance 04/16/2015  . Obesity 11/24/2014  . Tobacco abuse 11/24/2014  . Annual physical exam 11/24/2014  . CAD (coronary artery disease), native coronary artery 11/27/2013  . HLD (hyperlipidemia) 11/27/2013    Daniel Short,Daniel Short, PTA 11/22/2016, 12:54 PM  [facility] [address] [place], [place], [address] Phone: [phone]   Fax:  [phone]  Name: Daniel Short MRN: [id number] Date of Birth: [birth year]

## 2016-11-24 ENCOUNTER — Ambulatory Visit: Payer: Medicare HMO | Admitting: *Deleted

## 2016-11-24 DIAGNOSIS — G8929 Other chronic pain: Secondary | ICD-10-CM | POA: Diagnosis not present

## 2016-11-24 DIAGNOSIS — R293 Abnormal posture: Secondary | ICD-10-CM | POA: Diagnosis not present

## 2016-11-24 DIAGNOSIS — M545 Low back pain: Principal | ICD-10-CM

## 2016-11-24 NOTE — Therapy (Signed)
[facility] [place], [place], [address] Phone: [phone]   Fax:  [phone]  Physical Therapy Treatment  Patient Details  Name: Daniel Short MRN: [id number] Date of Birth: March 27, 1946 Referring Provider: Melina Schools MD.  Encounter Date: 11/24/2016      PT End of Session - 11/24/16 1134    Visit Number 4   Number of Visits 12   Date for PT Re-Evaluation 01/14/17   PT Start Time 1115   PT Stop Time 8101   PT Time Calculation (min) 50 min      Past Medical History:  Diagnosis Date  . Arthritis   . CAD (coronary artery disease)    with stent placement  . Complication of anesthesia    wife states he woke up during heart stent,; with stroke he tried to put out IV'S  . Coronary artery arteriosclerosis    had stenting in past; not sure when  . CVA (cerebrovascular accident due to intracerebral hemorrhage) ([place])   . Dyspnea    when walking short distances  . Ear infection 1979   right ear due to wax build up , stayed in hospital several days  . Headache   . Heart attack [facility])    developed 3rd degree AVB, inferior ST elevation following 02/15/02 LHC showing 70% RCA--> s/p temporary PPM and proximal/ostial RCA Zeta stents   . Stroke [facility]) 2000   cva due to intracerebral hemorrhage    Past Surgical History:  Procedure Laterality Date  . CORONARY STENT PLACEMENT  2005  . HERNIA REPAIR    . KNEE ARTHROSCOPY Right   . LUMBAR LAMINECTOMY/DECOMPRESSION MICRODISCECTOMY N/A 09/14/2016   Procedure: Decompression L3-5, insitu fusion L4-5;  Surgeon: Melina Schools, MD;  Location: [place];  Service: Orthopedics;  Laterality: N/A;  3 hrs  . LUMBAR LAMINECTOMY/DECOMPRESSION MICRODISCECTOMY N/A 09/17/2016   Procedure: WOUND EXPLORATION, GILL DECOMPRESSION,FAR LATERAL DISCECTOMY LEFT L4-L5, AND EVACUATION OF HEMATOMA;  Surgeon: Melina Schools, MD;  Location: [place];  Service: Orthopedics;  Laterality: N/A;    There were no  vitals filed for this visit.      Subjective Assessment - 11/24/16 1133    Subjective LBP 7/10 today for some reason   Patient is accompained by: Family member   Pertinent History CVA;MI.   Limitations Walking   How long can you walk comfortably? Short distances around house.   Patient Stated Goals Be independent again.   Currently in Pain? Yes   Pain Score 7    Pain Location Back   Pain Orientation Left;Mid   Pain Type Surgical pain   Pain Onset More than a month ago                         [facility] Adult PT Treatment/Exercise - 11/24/16 0001      Exercises   Exercises Lumbar     Lumbar Exercises: Aerobic   Stationary Bike nustep L4 x20 minsmin     Modalities   Modalities Electrical Stimulation;Moist Heat     Moist Heat Therapy   Number Minutes Moist Heat 15 Minutes   Moist Heat Location Lumbar Spine     Electrical Stimulation   Electrical Stimulation Location Lumbar.  IFC 80-150 hz x 15 mins   Electrical Stimulation Goals Tone;Pain     Manual Therapy   Manual Therapy Soft tissue mobilization;Myofascial release   Soft tissue mobilization STW to bil. LB paras and into SIJs with pt in LT  sidelying                  PT Short Term Goals - 11/15/16 1417      PT SHORT TERM GOAL #1   Title STG's=LTG's.           PT Long Term Goals - 11/15/16 1418      PT LONG TERM GOAL #1   Title Independent with a HEP.   Time 4   Period Weeks   Status New     PT LONG TERM GOAL #2   Title Walk a community distance with pain not > 3/10.   Time 4   Period Weeks   Status New     PT LONG TERM GOAL #3   Title Eliminate left LE symptoms.   Time 4   Period Weeks   Status New     PT LONG TERM GOAL #4   Title Perform ADL's with pain not > 3/10.   Time 4   Period Weeks   Status New     PT LONG TERM GOAL #5   Title Bilateral hip flexion in supine to 115 degrees.   Time 8   Period Weeks   Status New               Plan - 11/24/16 1135     Clinical Impression Statement Pt arrived today with increased LBP 7/10. He reports that he does sit a lot and wants to walk more, but it has been so hot he hasn't done much. Pt was encouraged to go walk at the grocery store or [employer] for his exs. Rx focused on some exs and decreasing LBP with STW and Modalities. He did well with STW and had notable tightness in Bil LB paras. He had decreased pain after Rx and was able to walk with improved gait.   Clinical Decision Making Low   Rehab Potential Good   Clinical Impairments Affecting Rehab Potential last surgery 09/17/16 current 9 weeks 11/19/16   PT Frequency 3x / week   PT Duration 4 weeks   PT Treatment/Interventions ADLs/Self Care Home Management;Cryotherapy;Electrical Stimulation;Moist Heat;Ultrasound;Functional mobility training;Therapeutic activities;Therapeutic exercise;Patient/family education;Manual techniques   PT Next Visit Plan cont with draw-ins; mini-crunches; low-level hip bridges and SKTC.  Nustep.  Modalites PRN.  No spinal loading.  Spinal motion contraindications in place. MD Rolena Infante 12/09/16   PT Home Exercise Plan issue HEP nest treatment   Consulted and Agree with Plan of Care Patient      Patient will benefit from skilled therapeutic intervention in order to improve the following deficits and impairments:  Decreased activity tolerance, Decreased mobility, Decreased range of motion, Postural dysfunction  Visit Diagnosis: Chronic left-sided low back pain, with sciatica presence unspecified  Abnormal posture     Problem List Patient Active Problem List   Diagnosis Date Noted  . Hematoma 09/17/2016  . Status post lumbar surgery 09/14/2016  . Back pain 09/14/2016  . Preoperative clearance 09/07/2016  . HTN (hypertension) 05/19/2016  . Healthcare maintenance 04/16/2015  . Obesity 11/24/2014  . Tobacco abuse 11/24/2014  . Annual physical exam 11/24/2014  . CAD (coronary artery disease), native coronary artery  11/27/2013  . HLD (hyperlipidemia) 11/27/2013    Desman Polak,CHRIS, PTA 11/24/2016, 12:13 PM  [facility] [place], [place], [address] Phone: [phone]   Fax:  [phone]  Name: Daniel Short MRN: [id number] Date of Birth: [birth year]

## 2016-11-25 ENCOUNTER — Other Ambulatory Visit: Payer: Self-pay | Admitting: Family Medicine

## 2016-11-29 ENCOUNTER — Ambulatory Visit: Payer: Medicare HMO | Attending: Orthopedic Surgery | Admitting: *Deleted

## 2016-11-29 DIAGNOSIS — M545 Low back pain: Secondary | ICD-10-CM | POA: Diagnosis not present

## 2016-11-29 DIAGNOSIS — R293 Abnormal posture: Secondary | ICD-10-CM

## 2016-11-29 DIAGNOSIS — G8929 Other chronic pain: Secondary | ICD-10-CM | POA: Insufficient documentation

## 2016-11-29 MED ORDER — ATORVASTATIN CALCIUM 20 MG PO TABS: 20.0000 mg | ORAL_TABLET | Freq: Every day | ORAL | 0 refills | Status: DC

## 2016-11-29 NOTE — Therapy (Signed)
[facility] [place], [place], [address] Phone: [phone]   Fax:  [phone]  Physical Therapy Treatment  Patient Details  Name: Daniel Short MRN: [id number] Date of Birth: 1945-12-23 Referring Provider: Melina Schools MD.  Encounter Date: 11/29/2016      PT End of Session - 11/29/16 1310    Visit Number 5   Number of Visits 12   Date for PT Re-Evaluation 01/14/17   PT Start Time 1302   PT Stop Time 1351   PT Time Calculation (min) 49 min      Past Medical History:  Diagnosis Date  . Arthritis   . CAD (coronary artery disease)    with stent placement  . Complication of anesthesia    wife states he woke up during heart stent,; with stroke he tried to put out IV'S  . Coronary artery arteriosclerosis    had stenting in past; not sure when  . CVA (cerebrovascular accident due to intracerebral hemorrhage) ([place])   . Dyspnea    when walking short distances  . Ear infection 1979   right ear due to wax build up , stayed in hospital several days  . Headache   . Heart attack [facility])    developed 3rd degree AVB, inferior ST elevation following 02/15/02 LHC showing 70% RCA--> s/p temporary PPM and proximal/ostial RCA Zeta stents   . Stroke [facility]) 2000   cva due to intracerebral hemorrhage    Past Surgical History:  Procedure Laterality Date  . CORONARY STENT PLACEMENT  2005  . HERNIA REPAIR    . KNEE ARTHROSCOPY Right   . LUMBAR LAMINECTOMY/DECOMPRESSION MICRODISCECTOMY N/A 09/14/2016   Procedure: Decompression L3-5, insitu fusion L4-5;  Surgeon: Melina Schools, MD;  Location: [place];  Service: Orthopedics;  Laterality: N/A;  3 hrs  . LUMBAR LAMINECTOMY/DECOMPRESSION MICRODISCECTOMY N/A 09/17/2016   Procedure: WOUND EXPLORATION, GILL DECOMPRESSION,FAR LATERAL DISCECTOMY LEFT L4-L5, AND EVACUATION OF HEMATOMA;  Surgeon: Melina Schools, MD;  Location: [place];  Service: Orthopedics;  Laterality: N/A;    There were no  vitals filed for this visit.      Subjective Assessment - 11/29/16 1307    Subjective LBP 7/10 today not doing well. I walked a lot after last Rx and think I over did it.   Patient is accompained by: Family member   Pertinent History CVA;MI.   Limitations Walking   How long can you walk comfortably? Short distances around house.   Patient Stated Goals Be independent again.   Currently in Pain? Yes   Pain Score 7    Pain Location Back   Pain Orientation Left   Pain Descriptors / Indicators Aching;Sharp   Pain Type Surgical pain   Pain Onset More than a month ago                         [facility] Adult PT Treatment/Exercise - 11/29/16 0001      Exercises   Exercises Lumbar     Lumbar Exercises: Aerobic   Stationary Bike nustep L4 x20  mins with focus on posture     Modalities   Modalities Electrical Stimulation;Moist Heat;Ultrasound     Moist Heat Therapy   Number Minutes Moist Heat 15 Minutes   Moist Heat Location Lumbar Spine     Electrical Stimulation   Electrical Stimulation Location Lumbar.  IFC 80-150 hz x 15 mins   Electrical Stimulation Goals Tone;Pain     Ultrasound  Ultrasound Location LB   Ultrasound Parameters 1.5 w/cm2 x10 mins  RT sidelying   Ultrasound Goals Pain                  PT Short Term Goals - 11/15/16 1417      PT SHORT TERM GOAL #1   Title STG's=LTG's.           PT Long Term Goals - 11/15/16 1418      PT LONG TERM GOAL #1   Title Independent with a HEP.   Time 4   Period Weeks   Status New     PT LONG TERM GOAL #2   Title Walk a community distance with pain not > 3/10.   Time 4   Period Weeks   Status New     PT LONG TERM GOAL #3   Title Eliminate left LE symptoms.   Time 4   Period Weeks   Status New     PT LONG TERM GOAL #4   Title Perform ADL's with pain not > 3/10.   Time 4   Period Weeks   Status New     PT LONG TERM GOAL #5   Title Bilateral hip flexion in supine to 115 degrees.    Time 8   Period Weeks   Status New               Plan - 11/29/16 1311    Clinical Impression Statement Pt arrived today doing about the same with pain in LB and LT leg. He was very sore after last Rx with STW so [place] was performed today to bil LB paras and was tolerated well. Normal response to [place] and [place].. Pt has been encouraged to perform core activation Exs more at home.   Clinical Decision Making Low   Rehab Potential Good   Clinical Impairments Affecting Rehab Potential last surgery 09/17/16 current 9 weeks 11/19/16   PT Frequency 3x / week   PT Duration 4 weeks   PT Treatment/Interventions ADLs/Self Care Home Management;Cryotherapy;Electrical Stimulation;Moist Heat;Ultrasound;Functional mobility training;Therapeutic activities;Therapeutic exercise;Patient/family education;Manual techniques   PT Next Visit Plan cont with draw-ins; mini-crunches; low-level hip bridges and SKTC.  Nustep.  Modalites PRN.  No spinal loading.  Spinal motion contraindications in place. MD Rolena Infante 12/09/16   PT Home Exercise Plan issue HEP nest treatment   Consulted and Agree with Plan of Care Patient      Patient will benefit from skilled therapeutic intervention in order to improve the following deficits and impairments:  Decreased activity tolerance, Decreased mobility, Decreased range of motion, Postural dysfunction  Visit Diagnosis: Chronic left-sided low back pain, with sciatica presence unspecified  Abnormal posture     Problem List Patient Active Problem List   Diagnosis Date Noted  . Hematoma 09/17/2016  . Status post lumbar surgery 09/14/2016  . Back pain 09/14/2016  . Preoperative clearance 09/07/2016  . HTN (hypertension) 05/19/2016  . Healthcare maintenance 04/16/2015  . Obesity 11/24/2014  . Tobacco abuse 11/24/2014  . Annual physical exam 11/24/2014  . CAD (coronary artery disease), native coronary artery 11/27/2013  . HLD (hyperlipidemia) 11/27/2013    RAMSEUR,CHRIS,  PTA 11/29/2016, 2:16 PM  [facility] [place], [place], [address] Phone: [phone]   Fax:  [phone]  Name: Daniel Short MRN: [id number] Date of Birth: 03-26-1946

## 2016-11-29 NOTE — Telephone Encounter (Signed)
Refill sent to pharmacy.   

## 2016-12-01 ENCOUNTER — Encounter: Payer: Self-pay | Admitting: Physical Therapy

## 2016-12-01 ENCOUNTER — Ambulatory Visit: Payer: Medicare HMO | Admitting: Physical Therapy

## 2016-12-01 DIAGNOSIS — M545 Low back pain: Principal | ICD-10-CM

## 2016-12-01 DIAGNOSIS — R293 Abnormal posture: Secondary | ICD-10-CM | POA: Diagnosis not present

## 2016-12-01 DIAGNOSIS — G8929 Other chronic pain: Secondary | ICD-10-CM | POA: Diagnosis not present

## 2016-12-01 NOTE — Therapy (Signed)
[facility] [place], [place], [address] Phone: [phone]   Fax:  [phone]  Physical Therapy Treatment  Patient Details  Name: Daniel Short MRN: [id number] Date of Birth: 1945-07-24 Referring Provider: Melina Schools MD.  Encounter Date: 12/01/2016      PT End of Session - 12/01/16 1140    Visit Number 6   Number of Visits 12   Date for PT Re-Evaluation 01/14/17   PT Start Time 1116   PT Stop Time 1211   PT Time Calculation (min) 55 min   Activity Tolerance Patient tolerated treatment well   Behavior During Therapy [facility] for tasks assessed/performed      Past Medical History:  Diagnosis Date  . Arthritis   . CAD (coronary artery disease)    with stent placement  . Complication of anesthesia    wife states he woke up during heart stent,; with stroke he tried to put out IV'S  . Coronary artery arteriosclerosis    had stenting in past; not sure when  . CVA (cerebrovascular accident due to intracerebral hemorrhage) ([place])   . Dyspnea    when walking short distances  . Ear infection 1979   right ear due to wax build up , stayed in hospital several days  . Headache   . Heart attack [facility])    developed 3rd degree AVB, inferior ST elevation following 02/15/02 LHC showing 70% RCA--> s/p temporary PPM and proximal/ostial RCA Zeta stents   . Stroke [facility]) 2000   cva due to intracerebral hemorrhage    Past Surgical History:  Procedure Laterality Date  . CORONARY STENT PLACEMENT  2005  . HERNIA REPAIR    . KNEE ARTHROSCOPY Right   . LUMBAR LAMINECTOMY/DECOMPRESSION MICRODISCECTOMY N/A 09/14/2016   Procedure: Decompression L3-5, insitu fusion L4-5;  Surgeon: Melina Schools, MD;  Location: [place];  Service: Orthopedics;  Laterality: N/A;  3 hrs  . LUMBAR LAMINECTOMY/DECOMPRESSION MICRODISCECTOMY N/A 09/17/2016   Procedure: WOUND EXPLORATION, GILL DECOMPRESSION,FAR LATERAL DISCECTOMY LEFT L4-L5, AND EVACUATION OF HEMATOMA;   Surgeon: Melina Schools, MD;  Location: [place];  Service: Orthopedics;  Laterality: N/A;    There were no vitals filed for this visit.      Subjective Assessment - 12/01/16 1117    Subjective Patient reported "not much change" overall. 3/10 pain with meds and up to 6+/10 pain without meds   Patient is accompained by: Family member   Pertinent History CVA;MI.   Limitations Walking   How long can you walk comfortably? Short distances around house.   Patient Stated Goals Be independent again.   Currently in Pain? Yes   Pain Score 3    Pain Location Back   Pain Orientation Left;Right   Pain Descriptors / Indicators Aching;Discomfort   Pain Type Surgical pain   Pain Radiating Towards left LE down to knee   Pain Onset More than a month ago   Pain Frequency Constant   Aggravating Factors  no medication   Pain Relieving Factors medication                         [facility] Adult PT Treatment/Exercise - 12/01/16 0001      Lumbar Exercises: Aerobic   Stationary Bike nustep L4 x20  mins with focus on posture     Lumbar Exercises: Standing   Scapular Retraction Strengthening;Both;20 reps;Theraband   Scapular Retraction Limitations pink XTS   Other Standing Lumbar Exercises latt pull x20 with  pink XTS for core activation/strengthening     Moist Heat Therapy   Number Minutes Moist Heat 15 Minutes   Moist Heat Location Lumbar Spine     Electrical Stimulation   Electrical Stimulation Location Lumbar.  IFC 80-150 hz x 15 mins   Electrical Stimulation Goals Tone;Pain     Ultrasound   Ultrasound Location low back paraspinals   Ultrasound Parameters 1.5w/cm2/50%/11mhz x34min   Ultrasound Goals Pain                  PT Short Term Goals - 11/15/16 1417      PT SHORT TERM GOAL #1   Title STG's=LTG's.           PT Long Term Goals - 12/01/16 1144      PT LONG TERM GOAL #1   Title Independent with a HEP.   Time 4   Period Weeks   Status On-going     PT  LONG TERM GOAL #2   Title Walk a community distance with pain not > 3/10.   Time 4   Period Weeks   Status On-going     PT LONG TERM GOAL #3   Title Eliminate left LE symptoms.   Time 4   Period Weeks   Status On-going     PT LONG TERM GOAL #4   Title Perform ADL's with pain not > 3/10.   Time 4   Period Weeks   Status On-going     PT LONG TERM GOAL #5   Title Bilateral hip flexion in supine to 115 degrees.   Time 8   Period Weeks   Status On-going               Plan - 12/01/16 1145    Clinical Impression Statement Patient tolerated treatment well today. Patient able to progress with standing core activation exercises with no discomfort reported. Patient continues to report relief after modalities. Patient has reported increased pain after and prolong walking or activity. May consider issue HEP next treatment if able to tolerate. Current goals ongoing due to pain limitations.    Rehab Potential Good   Clinical Impairments Affecting Rehab Potential last surgery 09/17/16 current 9 weeks 11/19/16   PT Frequency 3x / week   PT Duration 4 weeks   PT Treatment/Interventions ADLs/Self Care Home Management;Cryotherapy;Electrical Stimulation;Moist Heat;Ultrasound;Functional mobility training;Therapeutic activities;Therapeutic exercise;Patient/family education;Manual techniques   PT Next Visit Plan cont with draw-ins; mini-crunches; low-level hip bridges and SKTC.  Nustep.  Modalites PRN.  No spinal loading.  Spinal motion contraindications in place. MD Rolena Infante 12/09/16   Consulted and Agree with Plan of Care Patient      Patient will benefit from skilled therapeutic intervention in order to improve the following deficits and impairments:  Decreased activity tolerance, Decreased mobility, Decreased range of motion, Postural dysfunction  Visit Diagnosis: Chronic left-sided low back pain, with sciatica presence unspecified  Abnormal posture     Problem List Patient Active  Problem List   Diagnosis Date Noted  . Hematoma 09/17/2016  . Status post lumbar surgery 09/14/2016  . Back pain 09/14/2016  . Preoperative clearance 09/07/2016  . HTN (hypertension) 05/19/2016  . Healthcare maintenance 04/16/2015  . Obesity 11/24/2014  . Tobacco abuse 11/24/2014  . Annual physical exam 11/24/2014  . CAD (coronary artery disease), native coronary artery 11/27/2013  . HLD (hyperlipidemia) 11/27/2013    Trinh Sanjose P, PTA 12/01/2016, 12:15 PM  [facility] [place], [place],  [place] Phone: [phone]   Fax:  [phone]  Name: Daniel Short MRN: [id number] Date of Birth: 06-Sep-1945

## 2016-12-06 ENCOUNTER — Ambulatory Visit: Payer: Medicare HMO | Admitting: Physical Therapy

## 2016-12-06 DIAGNOSIS — G8929 Other chronic pain: Secondary | ICD-10-CM | POA: Diagnosis not present

## 2016-12-06 DIAGNOSIS — R293 Abnormal posture: Secondary | ICD-10-CM

## 2016-12-06 DIAGNOSIS — M545 Low back pain: Secondary | ICD-10-CM | POA: Diagnosis not present

## 2016-12-06 NOTE — Therapy (Signed)
[facility] [place], [place], [address] Phone: [phone]   Fax:  [phone]  Physical Therapy Treatment  Patient Details  Name: Daniel Short MRN: [id number] Date of Birth: 26-Sep-1945 Referring Provider: Melina Schools MD.  Encounter Date: 12/06/2016      PT End of Session - 12/06/16 1352    Visit Number 7   Number of Visits 12   Date for PT Re-Evaluation 01/14/17   PT Start Time 1347   PT Stop Time 1445   PT Time Calculation (min) 58 min   Activity Tolerance Patient tolerated treatment well   Behavior During Therapy [facility] for tasks assessed/performed      Past Medical History:  Diagnosis Date  . Arthritis   . CAD (coronary artery disease)    with stent placement  . Complication of anesthesia    wife states he woke up during heart stent,; with stroke he tried to put out IV'S  . Coronary artery arteriosclerosis    had stenting in past; not sure when  . CVA (cerebrovascular accident due to intracerebral hemorrhage) ([place])   . Dyspnea    when walking short distances  . Ear infection 1979   right ear due to wax build up , stayed in hospital several days  . Headache   . Heart attack [facility])    developed 3rd degree AVB, inferior ST elevation following 02/15/02 LHC showing 70% RCA--> s/p temporary PPM and proximal/ostial RCA Zeta stents   . Stroke [facility]) 2000   cva due to intracerebral hemorrhage    Past Surgical History:  Procedure Laterality Date  . CORONARY STENT PLACEMENT  2005  . HERNIA REPAIR    . KNEE ARTHROSCOPY Right   . LUMBAR LAMINECTOMY/DECOMPRESSION MICRODISCECTOMY N/A 09/14/2016   Procedure: Decompression L3-5, insitu fusion L4-5;  Surgeon: Melina Schools, MD;  Location: [place];  Service: Orthopedics;  Laterality: N/A;  3 hrs  . LUMBAR LAMINECTOMY/DECOMPRESSION MICRODISCECTOMY N/A 09/17/2016   Procedure: WOUND EXPLORATION, GILL DECOMPRESSION,FAR LATERAL DISCECTOMY LEFT L4-L5, AND EVACUATION OF  HEMATOMA;  Surgeon: Melina Schools, MD;  Location: [place];  Service: Orthopedics;  Laterality: N/A;    There were no vitals filed for this visit.      Subjective Assessment - 12/06/16 1353    Subjective Patient reports 5/10 pain today before pain pill.   Pertinent History CVA;MI.   Limitations Walking   How long can you walk comfortably? Short distances around house.   Patient Stated Goals Be independent again.   Currently in Pain? Yes   Pain Score 5    Pain Location Back   Pain Orientation Left   Pain Descriptors / Indicators Aching   Pain Type Surgical pain   Pain Radiating Towards LLE to lower leg   Pain Onset More than a month ago   Pain Frequency Constant   Pain Relieving Factors meds                         [facility] Adult PT Treatment/Exercise - 12/06/16 0001      Lumbar Exercises: Aerobic   Stationary Bike Nustep L4 x 15 min     Lumbar Exercises: Supine   Ab Set 5 seconds;10 reps   Bent Knee Raise 10 reps  each side with ab set   Other Supine Lumbar Exercises LTR x 5 ea     Modalities   Modalities Electrical Stimulation;Moist Heat     Moist Heat Therapy   Number Minutes  Moist Heat 15 Minutes   Moist Heat Location Lumbar Spine     Electrical Stimulation   Electrical Stimulation Location Lumbar.  IFC 80-150 hz x 15 mins   Electrical Stimulation Goals Tone;Pain                PT Education - 12/06/16 1555    Education provided Yes   Education Details HEP   Person(s) Educated Patient   Methods Explanation;Demonstration;Handout;Verbal cues;Tactile cues   Comprehension Verbalized understanding;Returned demonstration;Verbal cues required;Need further instruction          PT Short Term Goals - 11/15/16 1417      PT SHORT TERM GOAL #1   Title STG's=LTG's.           PT Long Term Goals - 12/01/16 1144      PT LONG TERM GOAL #1   Title Independent with a HEP.   Time 4   Period Weeks   Status On-going     PT LONG TERM GOAL #2    Title Walk a community distance with pain not > 3/10.   Time 4   Period Weeks   Status On-going     PT LONG TERM GOAL #3   Title Eliminate left LE symptoms.   Time 4   Period Weeks   Status On-going     PT LONG TERM GOAL #4   Title Perform ADL's with pain not > 3/10.   Time 4   Period Weeks   Status On-going     PT LONG TERM GOAL #5   Title Bilateral hip flexion in supine to 115 degrees.   Time 8   Period Weeks   Status On-going               Plan - 12/06/16 1556    Clinical Impression Statement Patient did well with exercise today, but was unable to engage TA in standing, so was reeducated on correct TA contraction in supine. Patient will likely require review of this at next visit. Patient demonstrated good lumbar mobility in supine with flex/ext and LTR. He was encouraged to increase his walking frequency and distance.   PT Treatment/Interventions ADLs/Self Care Home Management;Cryotherapy;Electrical Stimulation;Moist Heat;Ultrasound;Functional mobility training;Therapeutic activities;Therapeutic exercise;Patient/family education;Manual techniques   PT Next Visit Plan MD note for 9/14 visit. cont with draw-ins; mini-crunches; low-level hip bridges and SKTC.  Nustep.  Modalites PRN.  No spinal loading.  Spinal motion contraindications in place. MD Rolena Infante 12/09/16   PT Home Exercise Plan TA contraction with bent knee raise.      Patient will benefit from skilled therapeutic intervention in order to improve the following deficits and impairments:  Decreased activity tolerance, Decreased mobility, Decreased range of motion, Postural dysfunction  Visit Diagnosis: Chronic left-sided low back pain, with sciatica presence unspecified  Abnormal posture     Problem List Patient Active Problem List   Diagnosis Date Noted  . Hematoma 09/17/2016  . Status post lumbar surgery 09/14/2016  . Back pain 09/14/2016  . Preoperative clearance 09/07/2016  . HTN (hypertension)  05/19/2016  . Healthcare maintenance 04/16/2015  . Obesity 11/24/2014  . Tobacco abuse 11/24/2014  . Annual physical exam 11/24/2014  . CAD (coronary artery disease), native coronary artery 11/27/2013  . HLD (hyperlipidemia) 11/27/2013    Madelyn Flavors PT 12/06/2016, 4:00 PM  [facility] [address] [place], [place], [address] Phone: [phone]   Fax:  [phone]  Name: COBIN CADAVID MRN: [id number] Date of Birth: [birth year]

## 2016-12-06 NOTE — Patient Instructions (Signed)
Lower abdominal/core stability exercises  1. Practice your breathing technique: Inhale through your nose expanding your belly and rib cage. Try not to breathe into your chest. Exhale slowly and gradually out your mouth feeling a sense of softness to your body. Practice multiple times. This can be performed unlimited.  2. Finding the lower abdominals. Laying on your back with the knees bent, place your fingers just below your belly button. Using your breathing technique from above, on your exhale gently pull the belly button away from your fingertips without tensing any other muscles. Practice this 5x. Next, as you exhale, draw belly button inwards and hold onto it...then feel as if you are pulling that muscle across your pelvis like you are tightening a belt. This can be hard to do at first so be patient and practice. Do 5-10 reps 1-3 x day. Always recognize quality over quantity; if your abdominal muscles become tired you will notice you may tighten/contract other muscles. This is the time to take a break.   Practice this first laying on your back, then in sitting, progressing to standing and finally adding it to all your daily movements.   3. Finding your pelvic floor. Using the breathing technique above, when your exhale, this time draw your pelvic floor muscles up as if you were attempting to stop the flow of urination. Be careful NOT to tense any other muscles. This can be hard, BE PATIENT. Try to hold up to 10 seconds repeating 10x. Try 2x a day. Once you feel you are doing this well, add this contraction to exercise #2. First contracting your pelvic floor followed by lower abdominals.   4. Adding leg movements. Add the following leg movements to challenge your ability to keep your core stable:  1. Single leg drop outs: Laying on your back with knees bent feet flat. Inhale,  dropping one knee outward KEEPING YOUR PELVIS STILL. Exhale as you bring the leg back, simultaneously performing your lower  abdominal contraction. Do 5-10 on each leg.   2. Marching: While keeping your pelvis still, lift the right foot a few inches, put it down then lift left foot. This will mimic a march. Start slow to establish control. Once you have control you may speed it up. Do 10-20x. You MUST keep your lower abdominlas contracted while you march. Breathe naturally    3. Single leg slides: Inhale while you slowly slide one leg out keeping your pelvis still. Only slide your leg as far as you can keep your pelvis still. Exhale as you bring the leg back to the start, contracting the lower abdominals as you do that. Keep your upper body relaxed. Do 5-10 on each side.         Daniel Short, PT 12/06/16 2:32 PM [facility] [address] [place], [place], [address] Phone: [phone]   Fax:  [phone]

## 2016-12-08 ENCOUNTER — Ambulatory Visit: Payer: Medicare HMO | Admitting: Physical Therapy

## 2016-12-08 ENCOUNTER — Encounter: Payer: Self-pay | Admitting: Physical Therapy

## 2016-12-08 DIAGNOSIS — R293 Abnormal posture: Secondary | ICD-10-CM

## 2016-12-08 DIAGNOSIS — G8929 Other chronic pain: Secondary | ICD-10-CM

## 2016-12-08 DIAGNOSIS — M545 Low back pain: Principal | ICD-10-CM

## 2016-12-08 NOTE — Therapy (Addendum)
[facility] [place], [place], [address] Phone: [phone]   Fax:  [phone]  Physical Therapy Treatment  Patient Details  Name: Daniel Short MRN: [id number] Date of Birth: January 08, 1946 Referring Provider: Melina Schools MD.  Encounter Date: 12/08/2016      PT End of Session - 12/08/16 1401    Visit Number 8   Number of Visits 12   Date for PT Re-Evaluation 01/14/17   PT Start Time 1344   PT Stop Time 1432   PT Time Calculation (min) 48 min   Activity Tolerance Patient tolerated treatment well   Behavior During Therapy [facility] for tasks assessed/performed      Past Medical History:  Diagnosis Date  . Arthritis   . CAD (coronary artery disease)    with stent placement  . Complication of anesthesia    wife states he woke up during heart stent,; with stroke he tried to put out IV'S  . Coronary artery arteriosclerosis    had stenting in past; not sure when  . CVA (cerebrovascular accident due to intracerebral hemorrhage) ([place])   . Dyspnea    when walking short distances  . Ear infection 1979   right ear due to wax build up , stayed in hospital several days  . Headache   . Heart attack [facility])    developed 3rd degree AVB, inferior ST elevation following 02/15/02 LHC showing 70% RCA--> s/p temporary PPM and proximal/ostial RCA Zeta stents   . Stroke [facility]) 2000   cva due to intracerebral hemorrhage    Past Surgical History:  Procedure Laterality Date  . CORONARY STENT PLACEMENT  2005  . HERNIA REPAIR    . KNEE ARTHROSCOPY Right   . LUMBAR LAMINECTOMY/DECOMPRESSION MICRODISCECTOMY N/A 09/14/2016   Procedure: Decompression L3-5, insitu fusion L4-5;  Surgeon: Melina Schools, MD;  Location: [place];  Service: Orthopedics;  Laterality: N/A;  3 hrs  . LUMBAR LAMINECTOMY/DECOMPRESSION MICRODISCECTOMY N/A 09/17/2016   Procedure: WOUND EXPLORATION, GILL DECOMPRESSION,FAR LATERAL DISCECTOMY LEFT L4-L5, AND EVACUATION OF  HEMATOMA;  Surgeon: Melina Schools, MD;  Location: [place];  Service: Orthopedics;  Laterality: N/A;    There were no vitals filed for this visit.      Subjective Assessment - 12/08/16 1354    Subjective Patient arrived with reports of no improvement and ongoing pain/ with meds today   Patient is accompained by: Family member   Pertinent History CVA;MI.   Limitations Walking   How long can you walk comfortably? Short distances around house.   Patient Stated Goals Be independent again.   Currently in Pain? Yes   Pain Score 5    Pain Location Back   Pain Orientation Left   Pain Descriptors / Indicators Aching   Pain Type Surgical pain   Pain Onset More than a month ago   Pain Frequency Constant   Aggravating Factors  no medication   Pain Relieving Factors medication                         [facility] Adult PT Treatment/Exercise - 12/08/16 0001      Lumbar Exercises: Aerobic   Stationary Bike Nustep L4 x7mn UE/LE activity, educational cues for core activation     Moist Heat Therapy   Number Minutes Moist Heat 15 Minutes   Moist Heat Location Lumbar Spine     Electrical Stimulation   Electrical Stimulation Location Lumbar.  IFC 80-150 hz x 15 mins  Electrical Stimulation Goals Tone;Pain     Manual Therapy   Manual Therapy Soft tissue mobilization;Myofascial release   Soft tissue mobilization STW to bil. LB paras and into SIJs to reduce pain and muscle tighness                  PT Short Term Goals - 11/15/16 1417      PT SHORT TERM GOAL #1   Title STG's=LTG's.           PT Long Term Goals - 12/01/16 1144      PT LONG TERM GOAL #1   Title Independent with a HEP.   Time 4   Period Weeks   Status On-going     PT LONG TERM GOAL #2   Title Walk a community distance with pain not > 3/10.   Time 4   Period Weeks   Status On-going     PT LONG TERM GOAL #3   Title Eliminate left LE symptoms.   Time 4   Period Weeks   Status On-going      PT LONG TERM GOAL #4   Title Perform ADL's with pain not > 3/10.   Time 4   Period Weeks   Status On-going     PT LONG TERM GOAL #5   Title Bilateral hip flexion in supine to 115 degrees.   Time 8   Period Weeks   Status On-going               Plan - 12/08/16 1403    Clinical Impression Statement Patient tolerated treatment fair today. Patient reported not doing well after some exercises last treatment and requested only nustep, STW and modalities. Today during treatment monitored patient with ongoing educational cues for core activation. Educated patient on ongoing core activation with any activity and with sitting, walking and laying in bed to strengthen core. Educated patient on posture awareness techniques in all positions to help prevent pain or injury. Goals ongoing at this time due to pain deficts.    Rehab Potential Good   PT Frequency 3x / week   PT Duration 4 weeks   PT Treatment/Interventions ADLs/Self Care Home Management;Cryotherapy;Electrical Stimulation;Moist Heat;Ultrasound;Functional mobility training;Therapeutic activities;Therapeutic exercise;Patient/family education;Manual techniques   PT Next Visit Plan To MD note sent to Dr. Artemio Aly and Agree with Plan of Care Patient      Patient will benefit from skilled therapeutic intervention in order to improve the following deficits and impairments:  Decreased activity tolerance, Decreased mobility, Decreased range of motion, Postural dysfunction  Visit Diagnosis: Chronic left-sided low back pain, with sciatica presence unspecified  Abnormal posture     Problem List Patient Active Problem List   Diagnosis Date Noted  . Hematoma 09/17/2016  . Status post lumbar surgery 09/14/2016  . Back pain 09/14/2016  . Preoperative clearance 09/07/2016  . HTN (hypertension) 05/19/2016  . Healthcare maintenance 04/16/2015  . Obesity 11/24/2014  . Tobacco abuse 11/24/2014  . Annual physical exam  11/24/2014  . CAD (coronary artery disease), native coronary artery 11/27/2013  . HLD (hyperlipidemia) 11/27/2013    Ladean Raya, PTA 12/08/16 5:24 PM Mali Applegate MPT [facility] [place], [place], [address] Phone: [phone]   Fax:  [phone]  Name: Daniel Short MRN: [id number] Date of Birth: 09-Dec-1945  PHYSICAL THERAPY DISCHARGE SUMMARY  Visits from Start of Care: 8.  Current functional level related to goals / functional outcomes: See above.   Remaining  deficits: No goals met.   Education / Equipment: HEP. Plan: Patient agrees to discharge.  Patient goals were not met. Patient is being discharged due to lack of progress.  ?????         Mali Applegate MPT

## 2016-12-13 ENCOUNTER — Encounter: Payer: Medicare HMO | Admitting: *Deleted

## 2016-12-15 ENCOUNTER — Encounter: Payer: Medicare HMO | Admitting: Physical Therapy

## 2016-12-22 DIAGNOSIS — G5602 Carpal tunnel syndrome, left upper limb: Secondary | ICD-10-CM | POA: Diagnosis not present

## 2016-12-22 DIAGNOSIS — G5601 Carpal tunnel syndrome, right upper limb: Secondary | ICD-10-CM | POA: Diagnosis not present

## 2016-12-27 ENCOUNTER — Telehealth: Payer: Self-pay | Admitting: Cardiovascular Disease

## 2016-12-27 NOTE — Telephone Encounter (Signed)
Requesting surgical clearance:  1. Type of surgery: Left Wrist: left hand carpal tunnel release  2. Surgeon: Dr. Iran Planas  3.Surgical Date:01-09-17  4. Medications that need to be held: ASA 325mg --7 days prior   5. CAD: Yes  6. I will defer to:  Dr. Pearla Dubonnet Information:  [facility] [place] Fax: [phone]

## 2016-12-29 NOTE — Telephone Encounter (Signed)
OK to hold anti platelet Rx

## 2017-01-02 NOTE — Telephone Encounter (Signed)
Routed to [place] via [employer]

## 2017-01-09 DIAGNOSIS — G5601 Carpal tunnel syndrome, right upper limb: Secondary | ICD-10-CM | POA: Diagnosis not present

## 2017-01-09 DIAGNOSIS — G5602 Carpal tunnel syndrome, left upper limb: Secondary | ICD-10-CM | POA: Diagnosis not present

## 2017-01-23 DIAGNOSIS — G5602 Carpal tunnel syndrome, left upper limb: Secondary | ICD-10-CM | POA: Diagnosis not present

## 2017-01-25 ENCOUNTER — Other Ambulatory Visit: Payer: Self-pay | Admitting: Orthopedic Surgery

## 2017-01-25 DIAGNOSIS — Z4789 Encounter for other orthopedic aftercare: Secondary | ICD-10-CM | POA: Diagnosis not present

## 2017-01-25 DIAGNOSIS — Z01812 Encounter for preprocedural laboratory examination: Secondary | ICD-10-CM | POA: Diagnosis not present

## 2017-01-27 DIAGNOSIS — M4316 Spondylolisthesis, lumbar region: Secondary | ICD-10-CM | POA: Diagnosis not present

## 2017-01-31 DIAGNOSIS — M48062 Spinal stenosis, lumbar region with neurogenic claudication: Secondary | ICD-10-CM | POA: Diagnosis not present

## 2017-01-31 DIAGNOSIS — M4316 Spondylolisthesis, lumbar region: Secondary | ICD-10-CM | POA: Diagnosis not present

## 2017-01-31 DIAGNOSIS — M9983 Other biomechanical lesions of lumbar region: Secondary | ICD-10-CM | POA: Diagnosis not present

## 2017-02-14 DIAGNOSIS — M9983 Other biomechanical lesions of lumbar region: Secondary | ICD-10-CM | POA: Diagnosis not present

## 2017-02-14 DIAGNOSIS — M4316 Spondylolisthesis, lumbar region: Secondary | ICD-10-CM | POA: Diagnosis not present

## 2017-02-14 DIAGNOSIS — M48062 Spinal stenosis, lumbar region with neurogenic claudication: Secondary | ICD-10-CM | POA: Diagnosis not present

## 2017-02-28 DIAGNOSIS — M47816 Spondylosis without myelopathy or radiculopathy, lumbar region: Secondary | ICD-10-CM | POA: Diagnosis not present

## 2017-03-01 ENCOUNTER — Other Ambulatory Visit: Payer: Self-pay | Admitting: Family Medicine

## 2017-03-01 NOTE — Telephone Encounter (Signed)
Last lipid 2/18

## 2017-03-13 DIAGNOSIS — M9983 Other biomechanical lesions of lumbar region: Secondary | ICD-10-CM | POA: Diagnosis not present

## 2017-03-13 DIAGNOSIS — M4316 Spondylolisthesis, lumbar region: Secondary | ICD-10-CM | POA: Diagnosis not present

## 2017-04-11 DIAGNOSIS — G5602 Carpal tunnel syndrome, left upper limb: Secondary | ICD-10-CM | POA: Insufficient documentation

## 2017-06-12 ENCOUNTER — Other Ambulatory Visit: Payer: Self-pay | Admitting: Family Medicine

## 2017-06-13 DIAGNOSIS — M961 Postlaminectomy syndrome, not elsewhere classified: Secondary | ICD-10-CM | POA: Diagnosis not present

## 2017-06-13 DIAGNOSIS — T819XXA Unspecified complication of procedure, initial encounter: Secondary | ICD-10-CM | POA: Insufficient documentation

## 2017-06-13 DIAGNOSIS — M5136 Other intervertebral disc degeneration, lumbar region: Secondary | ICD-10-CM | POA: Diagnosis not present

## 2017-07-27 ENCOUNTER — Other Ambulatory Visit: Payer: Self-pay | Admitting: Family Medicine

## 2017-08-10 DIAGNOSIS — D485 Neoplasm of uncertain behavior of skin: Secondary | ICD-10-CM | POA: Diagnosis not present

## 2017-08-10 DIAGNOSIS — L820 Inflamed seborrheic keratosis: Secondary | ICD-10-CM | POA: Diagnosis not present

## 2017-09-14 ENCOUNTER — Other Ambulatory Visit: Payer: Self-pay | Admitting: Family Medicine

## 2017-09-15 ENCOUNTER — Encounter: Payer: Self-pay | Admitting: Family Medicine

## 2017-09-15 ENCOUNTER — Ambulatory Visit (INDEPENDENT_AMBULATORY_CARE_PROVIDER_SITE_OTHER): Payer: Medicare HMO | Admitting: Family Medicine

## 2017-09-15 ENCOUNTER — Ambulatory Visit (INDEPENDENT_AMBULATORY_CARE_PROVIDER_SITE_OTHER): Payer: Medicare HMO

## 2017-09-15 VITALS — BP 170/100 | HR 76 | Temp 97.0°F | Ht 65.0 in | Wt 225.0 lb

## 2017-09-15 DIAGNOSIS — M79671 Pain in right foot: Secondary | ICD-10-CM

## 2017-09-15 DIAGNOSIS — M549 Dorsalgia, unspecified: Secondary | ICD-10-CM | POA: Diagnosis not present

## 2017-09-15 DIAGNOSIS — G8929 Other chronic pain: Secondary | ICD-10-CM

## 2017-09-15 DIAGNOSIS — I10 Essential (primary) hypertension: Secondary | ICD-10-CM

## 2017-09-15 DIAGNOSIS — M7989 Other specified soft tissue disorders: Secondary | ICD-10-CM | POA: Diagnosis not present

## 2017-09-15 DIAGNOSIS — E7800 Pure hypercholesterolemia, unspecified: Secondary | ICD-10-CM | POA: Diagnosis not present

## 2017-09-15 MED ORDER — ATORVASTATIN CALCIUM 20 MG PO TABS: 20.0000 mg | ORAL_TABLET | Freq: Every day | ORAL | 3 refills | Status: DC

## 2017-09-15 MED ORDER — LISINOPRIL 20 MG PO TABS: 20.0000 mg | ORAL_TABLET | Freq: Every day | ORAL | 2 refills | Status: DC

## 2017-09-15 MED ORDER — PREDNISONE 10 MG PO TABS: ORAL_TABLET | ORAL | 0 refills | Status: DC

## 2017-09-15 MED ORDER — HYDROCHLOROTHIAZIDE 25 MG PO TABS: 25.0000 mg | ORAL_TABLET | Freq: Every day | ORAL | 3 refills | Status: DC

## 2017-09-15 NOTE — Patient Instructions (Signed)
Great to see you!  Prednisone is for your foot pain  We will work on a referral to a pain clinic.   Come back in 6 months for routine follow up

## 2017-09-15 NOTE — Progress Notes (Signed)
   HPI  Patient presents today for chronic medical conditions and right foot pain.  Hyperlipidemia Good medication compliance but has run out of Lipitor due to needing to follow-up.  Good medication compliance with blood pressure meds, HCTZ. No side effects.  Right foot pain Has been going on for about a month or 2, however pretty bad over the last 2 weeks. Patient thinks it may be gout Has not had any injuries.   PMH: Smoking status noted ROS: Per HPI  Objective: BP (!) 170/100   Pulse 76   Temp (!) 97 F (36.1 C) (Oral)   Ht _0  (1.651 m)   Wt 225 lb (102.1 kg)   BMI 37.44 kg/m  Gen: NAD, alert, cooperative with exam HEENT: NCAT CV: RRR, good S1/S2, no murmur Resp: CTABL, no wheezes, non-labored Ext: No edema, warm Neuro: Alert and oriented, No gross deficits R foot TTp of R foot bunion and 1st MTP  Assessment and plan:  #Foot pain Plan Home today Prednisone for possible gout If he has recurrent symptoms of gout would recommend DC HCTZ and starting losartan  #Hypertension Normalized after recheck Continue current medication Labs today  #Hyperlipidemia Refill Lipitor Labs today, nonfasting  Back pain-patient would like to see pain management, referral placed. Has been offered lumbar fusion by orthopedic spine surgery    Orders Placed This Encounter  Procedures  . DG Foot Complete Right    Standing Status:   Future    Number of Occurrences:   1    Standing Expiration Date:   11/16/2018    Order Specific Question:   Reason for Exam (SYMPTOM  OR DIAGNOSIS REQUIRED)    Answer:   R foot pain    Order Specific Question:   Preferred imaging location?    Answer:   Internal    Order Specific Question:   Radiology Contrast Protocol - do NOT remove file path    Answer:   [email or web address]  . CMP14+EGFR  . CBC with Differential/Platelet  . Lipid panel  . TSH  . Ambulatory referral to Pain Clinic    Referral  Priority:   Routine    Referral Type:   Consultation    Referral Reason:   Specialty Services Required    Requested Specialty:   Pain Medicine    Number of Visits Requested:   1    Meds ordered this encounter  Medications  . atorvastatin (LIPITOR) 20 MG tablet    Sig: Take 1 tablet (20 mg total) by mouth daily.    Dispense:  90 tablet    Refill:  3  . hydrochlorothiazide (HYDRODIURIL) 25 MG tablet    Sig: Take 1 tablet (25 mg total) by mouth daily.    Dispense:  90 tablet    Refill:  3  . predniSONE (DELTASONE) 10 MG tablet    Sig: Take 4 pills a day for 3 days, then 3 pills a day for 3 days, then 2 pills a day for 3 days, then 1 pill a day for 3 days, then stop    Dispense:  30 tablet    Refill:  0    Laroy Apple, MD [facility] 09/15/2017, 11:42 AM

## 2017-09-16 LAB — CBC WITH DIFFERENTIAL/PLATELET
BASOS: 1 %
Basophils Absolute: 0 10*3/uL (ref 0.0–0.2)
EOS (ABSOLUTE): 0.1 10*3/uL (ref 0.0–0.4)
Eos: 2 %
Hematocrit: 42.7 % (ref 37.5–51.0)
Hemoglobin: 14.6 g/dL (ref 13.0–17.7)
Immature Grans (Abs): 0 10*3/uL (ref 0.0–0.1)
Immature Granulocytes: 0 %
LYMPHS: 35 %
Lymphocytes Absolute: 2.8 10*3/uL (ref 0.7–3.1)
MCH: 29.9 pg (ref 26.6–33.0)
MCHC: 34.2 g/dL (ref 31.5–35.7)
MCV: 87 fL (ref 79–97)
Monocytes Absolute: 0.9 10*3/uL (ref 0.1–0.9)
Monocytes: 11 %
NEUTROS ABS: 4.1 10*3/uL (ref 1.4–7.0)
NEUTROS PCT: 51 %
PLATELETS: 214 10*3/uL (ref 150–450)
RBC: 4.89 x10E6/uL (ref 4.14–5.80)
RDW: 12.9 % (ref 12.3–15.4)
WBC: 8 10*3/uL (ref 3.4–10.8)

## 2017-09-16 LAB — LIPID PANEL
Chol/HDL Ratio: 4.1 ratio (ref 0.0–5.0)
Cholesterol, Total: 115 mg/dL (ref 100–199)
HDL: 28 mg/dL — ABNORMAL LOW (ref >39)
LDL Calculated: 62 mg/dL (ref 0–99)
Triglycerides: 126 mg/dL (ref 0–149)
VLDL Cholesterol Cal: 25 mg/dL (ref 5–40)

## 2017-09-16 LAB — CMP14+EGFR
A/G RATIO: 1.5 (ref 1.2–2.2)
ALT: 34 IU/L (ref 0–44)
AST: 31 IU/L (ref 0–40)
Albumin: 4.3 g/dL (ref 3.5–4.8)
Alkaline Phosphatase: 80 IU/L (ref 39–117)
BUN/Creatinine Ratio: 7 — ABNORMAL LOW (ref 10–24)
BUN: 9 mg/dL (ref 8–27)
Bilirubin Total: 1.2 mg/dL (ref 0.0–1.2)
CALCIUM: 9.7 mg/dL (ref 8.6–10.2)
CHLORIDE: 99 mmol/L (ref 96–106)
CO2: 22 mmol/L (ref 20–29)
Creatinine, Ser: 1.22 mg/dL (ref 0.76–1.27)
GFR, EST AFRICAN AMERICAN: 68 mL/min/{1.73_m2} (ref >59)
GFR, EST NON AFRICAN AMERICAN: 59 mL/min/{1.73_m2} — AB (ref >59)
GLOBULIN, TOTAL: 2.8 g/dL (ref 1.5–4.5)
Glucose: 230 mg/dL — ABNORMAL HIGH (ref 65–99)
POTASSIUM: 3.7 mmol/L (ref 3.5–5.2)
Sodium: 138 mmol/L (ref 134–144)
Total Protein: 7.1 g/dL (ref 6.0–8.5)

## 2017-09-16 LAB — TSH: TSH: 2.72 u[IU]/mL (ref 0.450–4.500)

## 2017-09-18 ENCOUNTER — Other Ambulatory Visit: Payer: Self-pay

## 2017-09-18 DIAGNOSIS — R7309 Other abnormal glucose: Secondary | ICD-10-CM

## 2017-09-20 LAB — HGB A1C W/O EAG: HEMOGLOBIN A1C: 8.8 % — AB (ref 4.8–5.6)

## 2017-09-20 LAB — SPECIMEN STATUS REPORT

## 2017-09-29 DIAGNOSIS — M961 Postlaminectomy syndrome, not elsewhere classified: Secondary | ICD-10-CM | POA: Diagnosis not present

## 2017-09-29 DIAGNOSIS — M5136 Other intervertebral disc degeneration, lumbar region: Secondary | ICD-10-CM | POA: Diagnosis not present

## 2017-09-29 DIAGNOSIS — Z79899 Other long term (current) drug therapy: Secondary | ICD-10-CM | POA: Diagnosis not present

## 2017-10-16 ENCOUNTER — Ambulatory Visit: Payer: Medicare HMO | Admitting: Family Medicine

## 2017-10-17 ENCOUNTER — Ambulatory Visit (INDEPENDENT_AMBULATORY_CARE_PROVIDER_SITE_OTHER): Payer: Medicare HMO | Admitting: Family Medicine

## 2017-10-17 ENCOUNTER — Encounter: Payer: Self-pay | Admitting: Family Medicine

## 2017-10-17 ENCOUNTER — Ambulatory Visit: Payer: Medicare HMO | Admitting: Family Medicine

## 2017-10-17 VITALS — BP 127/74 | HR 56 | Temp 97.1°F | Ht 65.0 in | Wt 222.6 lb

## 2017-10-17 DIAGNOSIS — E119 Type 2 diabetes mellitus without complications: Secondary | ICD-10-CM

## 2017-10-17 DIAGNOSIS — M1A9XX0 Chronic gout, unspecified, without tophus (tophi): Secondary | ICD-10-CM | POA: Diagnosis not present

## 2017-10-17 DIAGNOSIS — I10 Essential (primary) hypertension: Secondary | ICD-10-CM | POA: Diagnosis not present

## 2017-10-17 MED ORDER — METFORMIN HCL 500 MG PO TABS: 500.0000 mg | ORAL_TABLET | Freq: Two times a day (BID) | ORAL | 3 refills | Status: DC

## 2017-10-17 MED ORDER — LISINOPRIL 20 MG PO TABS: 20.0000 mg | ORAL_TABLET | Freq: Every day | ORAL | 3 refills | Status: DC

## 2017-10-17 NOTE — Progress Notes (Signed)
   HPI  Patient presents today for follow-up hypertension and discussion of diabetes.  This is a new diagnosis of diabetes, discussed high carbohydrate foods with patient.  Recommended diabetic education class which he declines. He states that he has been drinking at least 4 sodas a day, he eats high carbohydrate foods on a regular basis.  Hypertension Tolerating lisinopril well. Missed his pill this morning. We stopped HCTZ due to new diagnosis of gout.  Gout Improved, has responded well to prednisone previously Discussed the prednisone can worsen your blood sugar as well.  PMH: Smoking status noted ROS: Per HPI  Objective: BP 127/74   Pulse (!) 56   Temp (!) 97.1 F (36.2 C) (Oral)   Ht 5\' 5"  (1.651 m)   Wt 222 lb 9.6 oz (101 kg)   BMI 37.04 kg/m  Gen: NAD, alert, cooperative with exam HEENT: NCAT CV: RRR, good S1/S2, no murmur Resp: CTABL, no wheezes, non-labored Ext: No edema, warm Neuro: Alert and oriented, No gross deficits  Assessment and plan:  #Type 2 diabetes New diagnosis Start metformin Recommended diabetic education class which he declines Follow-up 2 months with new PCP for repeat A1c Discussed A1c goal less than 8 given age  #Hypertension Doing well on 20 mg lisinopril, no change Repeat BMP  #Gout Flare improved Patient would do well on allopurinol, however the most pertinent treatment currently is taking care of his diabetes, I have deferred discussing and starting allopurinol today due to big discussion about diabetes. HCTZ stopped due to risk of flares Consider losartan as this is a uricosuric agent, however he is doing very well with lisinopril currently and there is been multiple recalls lately of ARB's.   Meds ordered this encounter  Medications  . lisinopril (PRINIVIL,ZESTRIL) 20 MG tablet    Sig: Take 1 tablet (20 mg total) by mouth daily.    Dispense:  90 tablet    Refill:  3  . metFORMIN (GLUCOPHAGE) 500 MG tablet    Sig: Take 1  tablet (500 mg total) by mouth 2 (two) times daily with a meal.    Dispense:  60 tablet    Refill:  [place], MD [facility] 10/17/2017, 2:27 PM

## 2017-10-17 NOTE — Patient Instructions (Signed)
Try to cut out sugar sweetened sodas and other sugar sweetened beverages ( like sweet tea or juice)  Come back in 2 months for follow up diabetes to see Dr. Warrick Parisian ( after 9/21)  Start metformin 1 pill twice daily

## 2017-10-18 LAB — BMP8+EGFR
BUN/Creatinine Ratio: 6 — ABNORMAL LOW (ref 10–24)
BUN: 7 mg/dL — ABNORMAL LOW (ref 8–27)
CO2: 22 mmol/L (ref 20–29)
CREATININE: 1.16 mg/dL (ref 0.76–1.27)
Calcium: 9.7 mg/dL (ref 8.6–10.2)
Chloride: 102 mmol/L (ref 96–106)
GFR calc non Af Amer: 63 mL/min/{1.73_m2} (ref >59)
GFR, EST AFRICAN AMERICAN: 72 mL/min/{1.73_m2} (ref >59)
GLUCOSE: 219 mg/dL — AB (ref 65–99)
Potassium: 4.4 mmol/L (ref 3.5–5.2)
SODIUM: 140 mmol/L (ref 134–144)

## 2017-10-24 DIAGNOSIS — M961 Postlaminectomy syndrome, not elsewhere classified: Secondary | ICD-10-CM | POA: Diagnosis not present

## 2017-10-24 DIAGNOSIS — Z79899 Other long term (current) drug therapy: Secondary | ICD-10-CM | POA: Diagnosis not present

## 2017-10-24 DIAGNOSIS — G8929 Other chronic pain: Secondary | ICD-10-CM | POA: Diagnosis not present

## 2017-12-20 ENCOUNTER — Encounter: Payer: Self-pay | Admitting: Family

## 2017-12-20 ENCOUNTER — Ambulatory Visit (INDEPENDENT_AMBULATORY_CARE_PROVIDER_SITE_OTHER): Payer: Medicare HMO | Admitting: Family

## 2017-12-20 VITALS — BP 133/87 | HR 72 | Temp 97.9°F | Ht 65.0 in | Wt 221.6 lb

## 2017-12-20 DIAGNOSIS — I10 Essential (primary) hypertension: Secondary | ICD-10-CM | POA: Diagnosis not present

## 2017-12-20 DIAGNOSIS — Z1211 Encounter for screening for malignant neoplasm of colon: Secondary | ICD-10-CM | POA: Diagnosis not present

## 2017-12-20 DIAGNOSIS — E118 Type 2 diabetes mellitus with unspecified complications: Secondary | ICD-10-CM

## 2017-12-20 DIAGNOSIS — M109 Gout, unspecified: Secondary | ICD-10-CM

## 2017-12-20 DIAGNOSIS — Z1212 Encounter for screening for malignant neoplasm of rectum: Secondary | ICD-10-CM | POA: Diagnosis not present

## 2017-12-20 DIAGNOSIS — E119 Type 2 diabetes mellitus without complications: Secondary | ICD-10-CM | POA: Insufficient documentation

## 2017-12-20 DIAGNOSIS — E1159 Type 2 diabetes mellitus with other circulatory complications: Secondary | ICD-10-CM | POA: Diagnosis not present

## 2017-12-20 DIAGNOSIS — M549 Dorsalgia, unspecified: Secondary | ICD-10-CM | POA: Diagnosis not present

## 2017-12-20 DIAGNOSIS — E1169 Type 2 diabetes mellitus with other specified complication: Secondary | ICD-10-CM

## 2017-12-20 DIAGNOSIS — G8929 Other chronic pain: Secondary | ICD-10-CM

## 2017-12-20 DIAGNOSIS — E785 Hyperlipidemia, unspecified: Secondary | ICD-10-CM

## 2017-12-20 DIAGNOSIS — I2510 Atherosclerotic heart disease of native coronary artery without angina pectoris: Secondary | ICD-10-CM | POA: Diagnosis not present

## 2017-12-20 LAB — BAYER DCA HB A1C WAIVED: HB A1C: 6.8 % (ref <7.0)

## 2017-12-20 MED ORDER — PREDNISONE 10 MG (21) PO TBPK: ORAL_TABLET | ORAL | 0 refills | Status: DC

## 2017-12-20 MED ORDER — ATORVASTATIN CALCIUM 20 MG PO TABS: 20.0000 mg | ORAL_TABLET | Freq: Every day | ORAL | 3 refills | Status: DC

## 2017-12-20 MED ORDER — LISINOPRIL 20 MG PO TABS: 20.0000 mg | ORAL_TABLET | Freq: Every day | ORAL | 3 refills | Status: DC

## 2017-12-20 MED ORDER — ALLOPURINOL 100 MG PO TABS: 100.0000 mg | ORAL_TABLET | Freq: Every day | ORAL | 1 refills | Status: DC

## 2017-12-20 MED ORDER — METFORMIN HCL ER 500 MG PO TB24: 500.0000 mg | ORAL_TABLET | Freq: Every day | ORAL | 1 refills | Status: DC

## 2017-12-20 NOTE — Patient Instructions (Signed)

## 2017-12-20 NOTE — Progress Notes (Signed)
Subjective:    Patient ID: Daniel Short, male    DOB: 1945/09/24, 72 y.o.   MRN: [id number]  Chief Complaint  Patient presents with  . Diabetes   PT presents to the office today for chronic follow up. He is followed by Pain clinic for chronic back pain. Pt is followed by Cardiologists annually for CAD.  Diabetes  He presents for his follow-up diabetic visit. He has type 2 diabetes mellitus. There are no hypoglycemic associated symptoms. Pertinent negatives for diabetes include no blurred vision, no foot paresthesias and no visual change. Symptoms are stable. Diabetic complications include a CVA and heart disease. Pertinent negatives for diabetic complications include no nephropathy or peripheral neuropathy. Risk factors for coronary artery disease include dyslipidemia, diabetes mellitus, male sex, hypertension and sedentary lifestyle. He is following a generally unhealthy diet. (Does not check BS at home ) Eye exam is not current.  Hypertension  This is a chronic problem. The current episode started more than 1 year ago. The problem has been resolved since onset. The problem is controlled. Associated symptoms include peripheral edema. Pertinent negatives include no blurred vision or malaise/fatigue. Risk factors for coronary artery disease include family history, dyslipidemia, diabetes mellitus, obesity, male gender and sedentary lifestyle. The current treatment provides moderate improvement. Hypertensive end-organ damage includes CVA.  Hyperlipidemia  This is a chronic problem. The current episode started more than 1 year ago. The problem is controlled. Recent lipid tests were reviewed and are normal. Exacerbating diseases include obesity. Current antihyperlipidemic treatment includes statins. The current treatment provides moderate improvement of lipids. Risk factors for coronary artery disease include dyslipidemia, diabetes mellitus, male sex, hypertension and a sedentary lifestyle.   Gout PT states he is having right foot swelling and tenderness that started that over the last week.    Review of Systems  Constitutional: Negative for malaise/fatigue.  Eyes: Negative for blurred vision.  Musculoskeletal: Positive for arthralgias.  All other systems reviewed and are negative.      Objective:   Physical Exam  Constitutional: He is oriented to person, place, and time. He appears well-developed and well-nourished. No distress.  HENT:  Head: Normocephalic.  Right Ear: External ear normal.  Left Ear: External ear normal.  Nose: Rhinorrhea present.  Mouth/Throat: Posterior oropharyngeal erythema present.  Eyes: Pupils are equal, round, and reactive to light. Right eye exhibits no discharge. Left eye exhibits no discharge.  Neck: Normal range of motion. Neck supple. No thyromegaly present.  Cardiovascular: Normal rate, regular rhythm, normal heart sounds and intact distal pulses.  No murmur heard. Pulmonary/Chest: Effort normal and breath sounds normal. No respiratory distress. He has no wheezes.  Abdominal: Soft. Bowel sounds are normal. He exhibits no distension. There is no tenderness.  Musculoskeletal: He exhibits edema and tenderness (warmth, tenderness in right great toe).  Neurological: He is alert and oriented to person, place, and time. He has normal reflexes. No cranial nerve deficit.  Skin: Skin is warm and dry. No rash noted. No erythema.  Psychiatric: He has a normal mood and affect. His behavior is normal. Judgment and thought content normal.  Vitals reviewed.    Diabetic Foot Exam - Simple   Simple Foot Form Diabetic Foot exam was performed with the following findings:  Yes 12/20/2017  2:43 PM  Visual Inspection No deformities, no ulcerations, no other skin breakdown bilaterally:  Yes Sensation Testing Intact to touch and monofilament testing bilaterally:  Yes Pulse Check Posterior Tibialis and Dorsalis pulse intact bilaterally:  Yes  Comments       BP 133/87   Pulse 72   Temp 97.9 F (36.6 C) (Oral)   Ht 5' 5" (1.651 m)   Wt 221 lb 9.6 oz (100.5 kg)   BMI 36.88 kg/m      Assessment & Plan:  Daniel Short comes in today with chief complaint of Diabetes   Diagnosis and orders addressed:  1. Hyperlipidemia associated with type 2 diabetes mellitus (HCC) - CMP14+EGFR - CBC with Differential/Platelet - Lipid panel - atorvastatin (LIPITOR) 20 MG tablet; Take 1 tablet (20 mg total) by mouth daily.  Dispense: 90 tablet; Refill: 3  2. Morbid obesity ([place]) - CMP14+EGFR - CBC with Differential/Platelet  3. Hypertension associated with diabetes ([place]) - CMP14+EGFR - CBC with Differential/Platelet - lisinopril (PRINIVIL,ZESTRIL) 20 MG tablet; Take 1 tablet (20 mg total) by mouth daily.  Dispense: 90 tablet; Refill: 3  4. Coronary artery disease involving native coronary artery of native heart without angina pectoris - CMP14+EGFR - CBC with Differential/Platelet  5. Chronic back pain, unspecified back location, unspecified back pain laterality - CMP14+EGFR - CBC with Differential/Platelet  6. Type 2 diabetes mellitus with complication, without long-term current use of insulin (HCC) Will change metformin to XR from BID - CMP14+EGFR - CBC with Differential/Platelet - Bayer DCA Hb A1c Waived - metFORMIN (GLUCOPHAGE XR) 500 MG 24 hr tablet; Take 1 tablet (500 mg total) by mouth daily with breakfast.  Dispense: 90 tablet; Refill: 1  7. Acute gout involving toe of right foot, unspecified cause Start prednisone today Motrin prn  Start allopurinol in 3 weeks  Low purine diet - CMP14+EGFR - CBC with Differential/Platelet - Uric acid - predniSONE (STERAPRED UNI-PAK 21 TAB) 10 MG (21) TBPK tablet; Use as directed  Dispense: 21 tablet; Refill: 0 - allopurinol (ZYLOPRIM) 100 MG tablet; Take 1 tablet (100 mg total) by mouth daily.  Dispense: 90 tablet; Refill: 1  8. Colon cancer screening - Cologuard  9. Screening for  malignant neoplasm of the rectum - Cologuard   Labs pending Health Maintenance reviewed Diet and exercise encouraged  Follow up plan: 3 months    Daniel Dun, FNP

## 2017-12-21 ENCOUNTER — Ambulatory Visit: Payer: Medicare HMO | Admitting: Family Medicine

## 2017-12-21 LAB — LIPID PANEL
CHOLESTEROL TOTAL: 121 mg/dL (ref 100–199)
Chol/HDL Ratio: 4 ratio (ref 0.0–5.0)
HDL: 30 mg/dL — ABNORMAL LOW (ref >39)
LDL CALC: 62 mg/dL (ref 0–99)
TRIGLYCERIDES: 147 mg/dL (ref 0–149)
VLDL CHOLESTEROL CAL: 29 mg/dL (ref 5–40)

## 2017-12-21 LAB — CBC WITH DIFFERENTIAL/PLATELET
BASOS: 1 %
Basophils Absolute: 0.1 10*3/uL (ref 0.0–0.2)
EOS (ABSOLUTE): 0.2 10*3/uL (ref 0.0–0.4)
Eos: 2 %
Hematocrit: 42.8 % (ref 37.5–51.0)
Hemoglobin: 14.3 g/dL (ref 13.0–17.7)
IMMATURE GRANS (ABS): 0 10*3/uL (ref 0.0–0.1)
IMMATURE GRANULOCYTES: 0 %
LYMPHS: 32 %
Lymphocytes Absolute: 3.2 10*3/uL — ABNORMAL HIGH (ref 0.7–3.1)
MCH: 29.9 pg (ref 26.6–33.0)
MCHC: 33.4 g/dL (ref 31.5–35.7)
MCV: 90 fL (ref 79–97)
Monocytes Absolute: 1.4 10*3/uL — ABNORMAL HIGH (ref 0.1–0.9)
Monocytes: 14 %
NEUTROS ABS: 5.2 10*3/uL (ref 1.4–7.0)
Neutrophils: 51 %
PLATELETS: 259 10*3/uL (ref 150–450)
RBC: 4.78 x10E6/uL (ref 4.14–5.80)
RDW: 12 % — ABNORMAL LOW (ref 12.3–15.4)
WBC: 10.1 10*3/uL (ref 3.4–10.8)

## 2017-12-21 LAB — CMP14+EGFR
A/G RATIO: 1.9 (ref 1.2–2.2)
ALT: 27 IU/L (ref 0–44)
AST: 24 IU/L (ref 0–40)
Albumin: 4.6 g/dL (ref 3.5–4.8)
Alkaline Phosphatase: 69 IU/L (ref 39–117)
BUN/Creatinine Ratio: 9 — ABNORMAL LOW (ref 10–24)
BUN: 10 mg/dL (ref 8–27)
Bilirubin Total: 1.7 mg/dL — ABNORMAL HIGH (ref 0.0–1.2)
CO2: 24 mmol/L (ref 20–29)
Calcium: 9.7 mg/dL (ref 8.6–10.2)
Chloride: 99 mmol/L (ref 96–106)
Creatinine, Ser: 1.09 mg/dL (ref 0.76–1.27)
GFR, EST AFRICAN AMERICAN: 78 mL/min/{1.73_m2} (ref >59)
GFR, EST NON AFRICAN AMERICAN: 67 mL/min/{1.73_m2} (ref >59)
GLOBULIN, TOTAL: 2.4 g/dL (ref 1.5–4.5)
Glucose: 88 mg/dL (ref 65–99)
POTASSIUM: 4.6 mmol/L (ref 3.5–5.2)
Sodium: 140 mmol/L (ref 134–144)
TOTAL PROTEIN: 7 g/dL (ref 6.0–8.5)

## 2017-12-21 LAB — URIC ACID: Uric Acid: 9.1 mg/dL — ABNORMAL HIGH (ref 3.7–8.6)

## 2018-02-13 DIAGNOSIS — Z79899 Other long term (current) drug therapy: Secondary | ICD-10-CM | POA: Diagnosis not present

## 2018-02-13 DIAGNOSIS — G8929 Other chronic pain: Secondary | ICD-10-CM | POA: Diagnosis not present

## 2018-02-13 DIAGNOSIS — M961 Postlaminectomy syndrome, not elsewhere classified: Secondary | ICD-10-CM | POA: Diagnosis not present

## 2018-02-22 IMAGING — MR MR LUMBAR SPINE WO/W CM
4 of 7 series · 18 of 48 positions shown · IV contrast (multihance)
Comparison: The patient's preoperative MRI is not available for
assessment and comparison.

CLINICAL DATA: Severe increase in posterior LEFT knee pain.

EXAM:
MRI LUMBAR SPINE WITHOUT AND WITH CONTRAST
TECHNIQUE: Multiplanar and multiecho pulse sequences of the lumbar spine were
obtained without and with intravenous contrast.
CONTRAST:  20mL MULTIHANCE GADOBENATE DIMEGLUMINE 529 MG/ML IV SOLN

[Series 4: T2 · sagittal · 4.0mm · 0.55mm/px · 5 of 14 slices shown (1 of 2)]
[im 1/14]
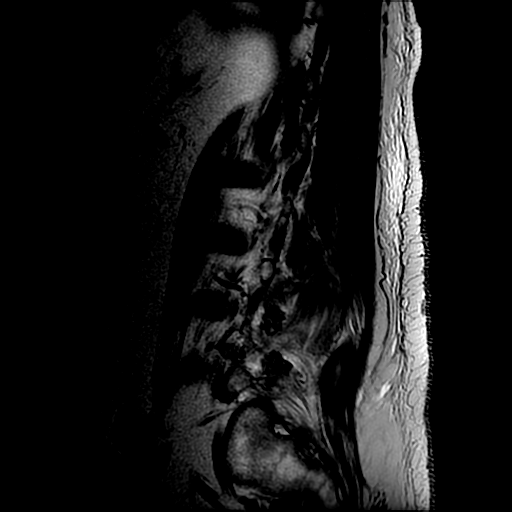
[im 4/14]
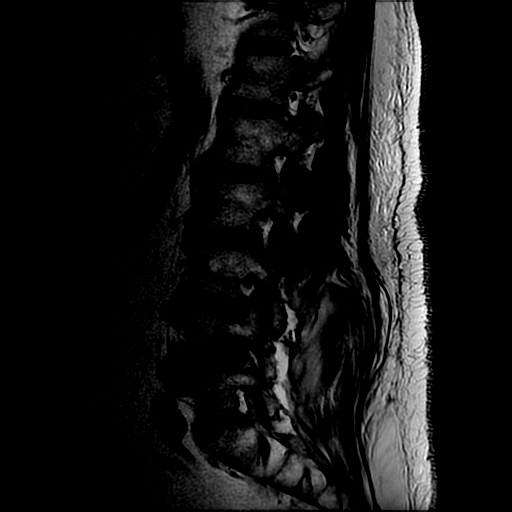
[im 7/14]
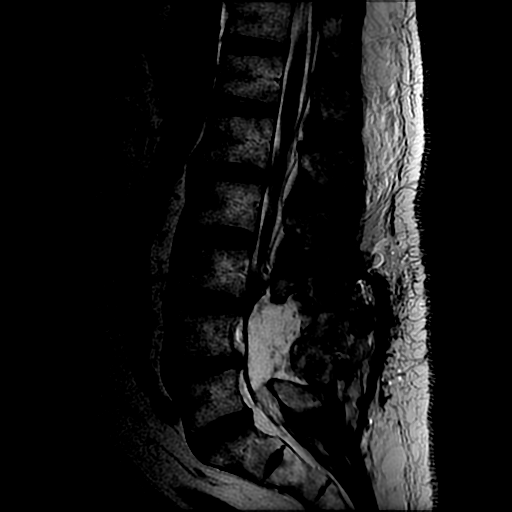
[im 10/14]
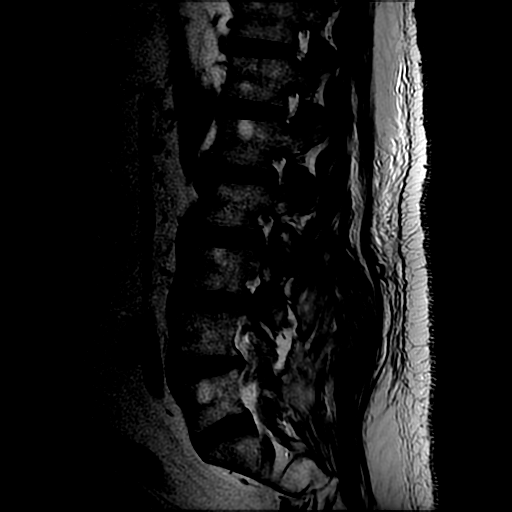
[im 14/14]
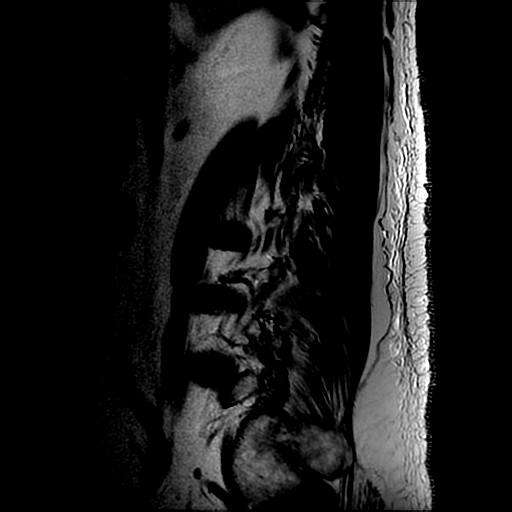

[Series 5: T1 · sagittal · 4.0mm · 0.55mm/px · 3 of 14 slices shown (1 of 2)]
[im 1/14]
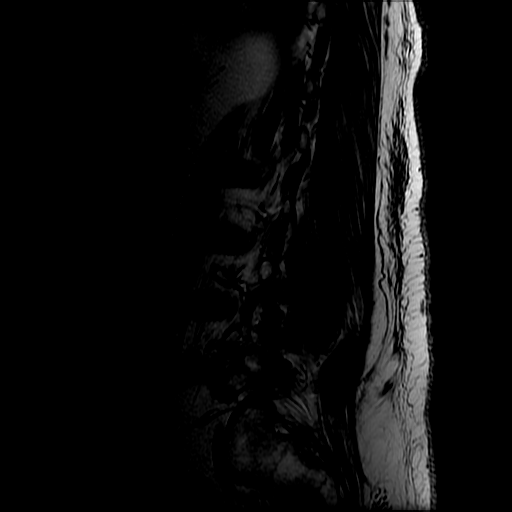
[im 7/14]
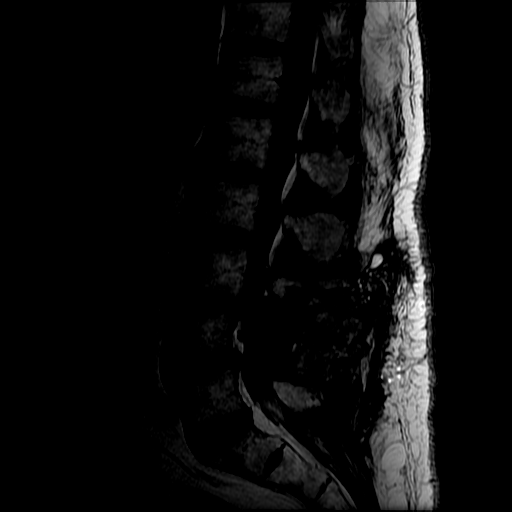
[im 14/14]
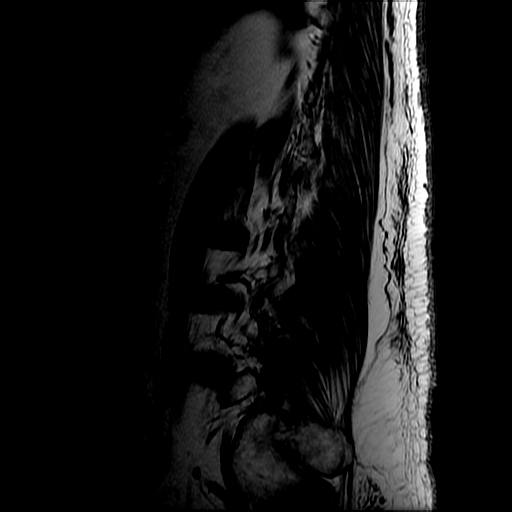

[Series 7: T2 · axial · 4.0mm · 0.39mm/px · z∈[-175,-25]mm · 7 of 35 slices shown (2 of 2)]
[im 1/35]
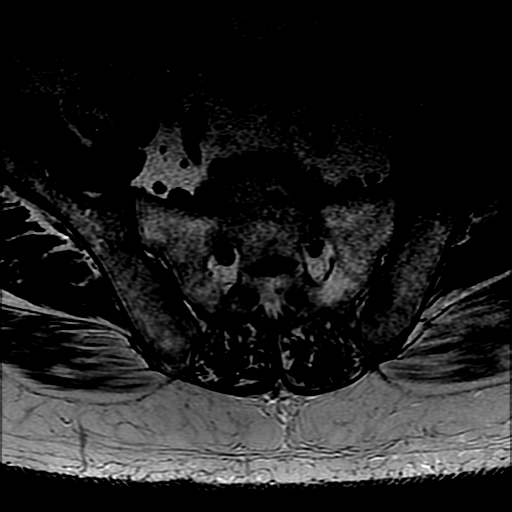
[im 4/35]
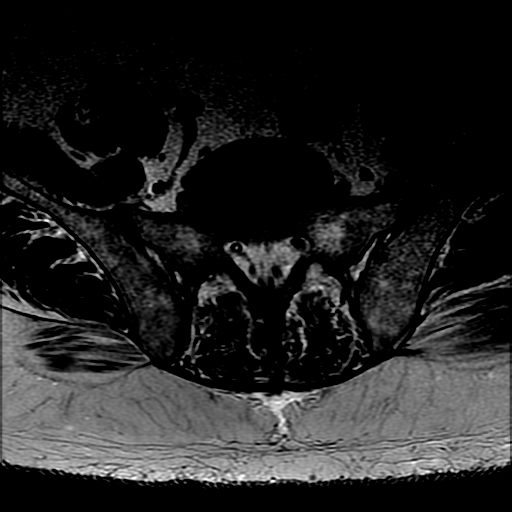
[im 12/35]
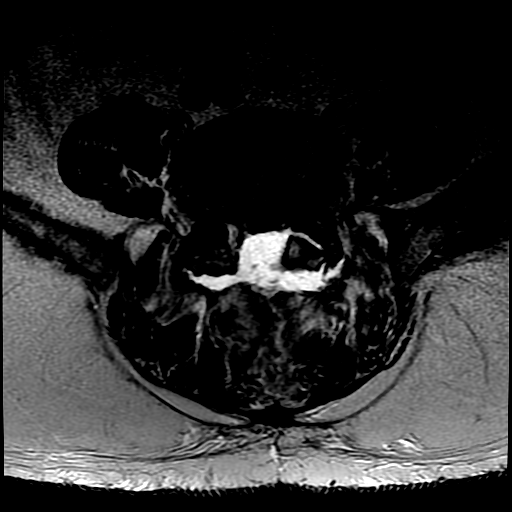
[im 16/35]
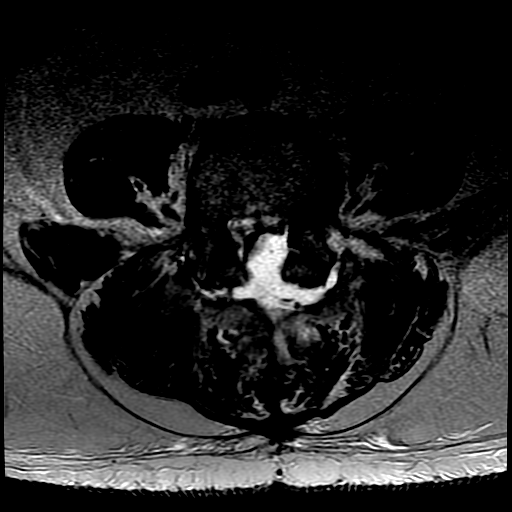
[im 19/35]
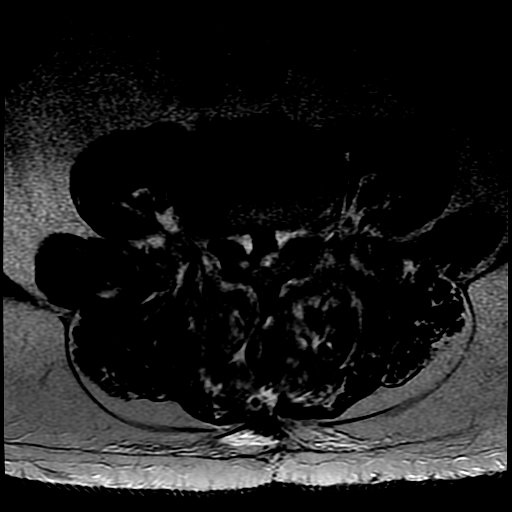
[im 23/35]
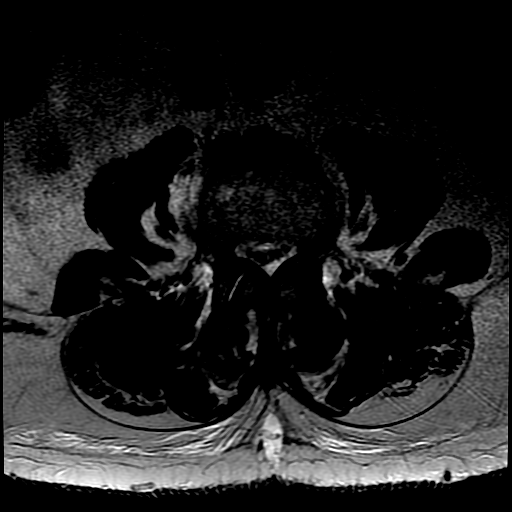
[im 31/35]
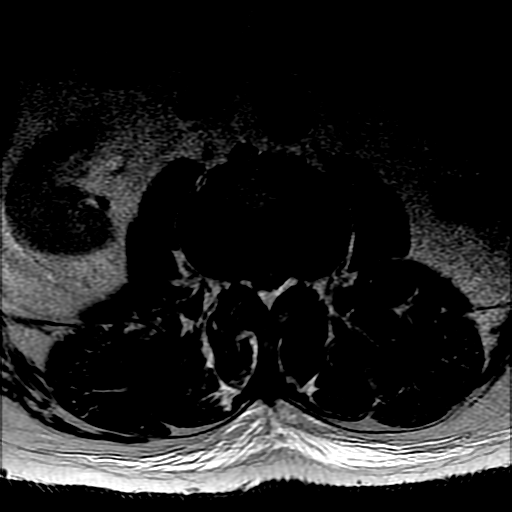

[Series 8: T1 · axial · 4.0mm · 0.39mm/px · z∈[-160,-25]mm · 3 of 35 slices shown (2 of 2)]
[im 4/35]
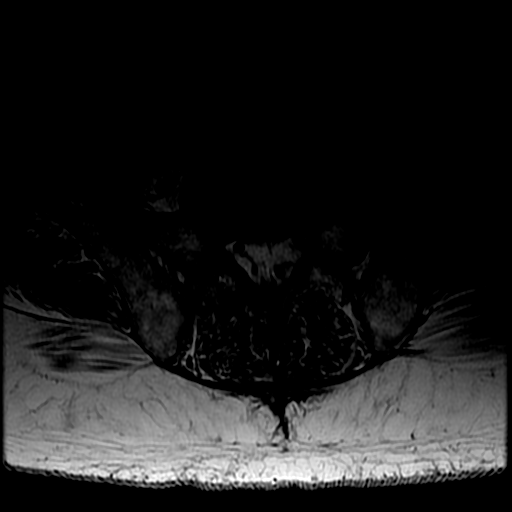
[im 19/35]
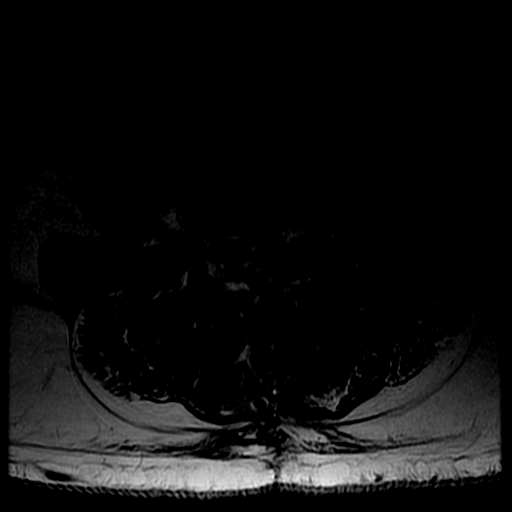
[im 31/35]
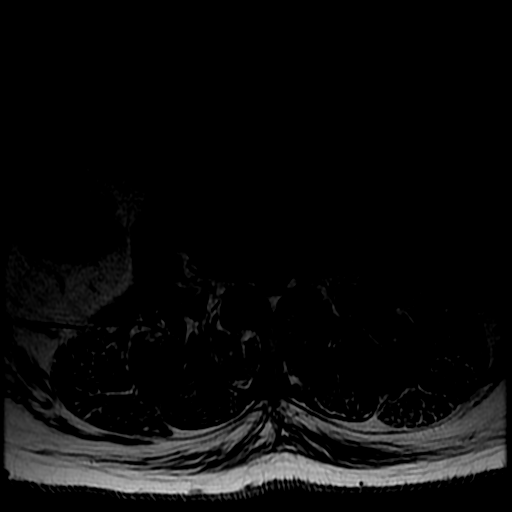

[18 of 48 positions shown; findings below may reference images not displayed]

Intraoperative plain films were performed
09/14/2016. Plain films from 05/19/2016 are reviewed, but this
numbering scheme is not employed.
FINDINGS: Segmentation: Standard numbering scheme. Numbering used today is
identical with that which was assessed at time of surgery.

Alignment:  Anatomic

Vertebrae:  No worrisome osseous lesion

Conus medullaris: Extends to the L1 level and appears normal.

Paraspinal and other soft tissues: Paraspinous muscle edema is an
expected postoperative finding. Normal-appearing aorta and kidneys.

Disc levels:

L1-L2:  Normal.

L2-L3: Congenital and acquired stenosis with short pedicles. Facet
arthropathy. Small 3 x 4 mm subdural collection dorsally, likely
postoperative effusion.

L3-L4: Posterior decompression with spinous process removal. Central
protrusion, but no apparent previous discectomy. T2 hyperintense
fluid without significant enhancement fills the laminectomy site.
Severe stenosis, with LEFT greater than RIGHT thecal sac compression
and displacement ventrally.

L4-L5: Status post laminectomy, spinous process removal, and in situ
fusion. Critical stenosis. Near complete compression of the thecal
sac ventrally. T2 hyperintense fluid extends to the subarticular
zone on the LEFT, see image 26 series 7. Large fluid collection
fills the laminectomy site, cross-section 24 x 28 x 46 mm. Findings
are consistent with a CSF leak from the LEFT L5 nerve root.
Postoperative seroma or hematoma not excluded.

There is a large disc extrusion in the LEFT foraminal and
extraforaminal compartment. Some disc material on the LEFT
subarticular zone. See axial image 24, and sagittal images 11-12,
all series. Severe LEFT L4 nerve root impingement.

L5-S1:  Unremarkable.
IMPRESSION: The patient's LEFT knee pain likely derives from a large leftward
foraminal and extraforaminal disc extrusion at L4-5. Severe LEFT L4
nerve root impingement is present. I am unable to assess interval
change from preoperative MR.

Critical stenosis at L4-5 due to a large T2 hyperintense fluid
collection at the laminectomy site, measuring 24 x 28 x 46 mm.
Suspect CSF leak originating from the LEFT L5 nerve root although
postoperative seroma not excluded.

Severe additional stenosis at L3-4 due to compression by the large
T2 hyperintense fluid collection.

Small subdural fluid collection extends dorsally as far as the L2-L3
interspace, an additional finding suggesting a dural tear. These
results were called by telephone at the time of interpretation on
09/17/2016 at [DATE] to Dr. PATRYK ONATE , who verbally acknowledged
these results.

## 2018-04-12 ENCOUNTER — Encounter: Payer: Self-pay | Admitting: Family

## 2018-04-12 ENCOUNTER — Ambulatory Visit (INDEPENDENT_AMBULATORY_CARE_PROVIDER_SITE_OTHER): Payer: Medicare HMO | Admitting: Family

## 2018-04-12 VITALS — BP 130/66 | HR 60 | Temp 97.1°F | Ht 65.0 in | Wt 228.6 lb

## 2018-04-12 DIAGNOSIS — Z1211 Encounter for screening for malignant neoplasm of colon: Secondary | ICD-10-CM

## 2018-04-12 DIAGNOSIS — I152 Hypertension secondary to endocrine disorders: Secondary | ICD-10-CM

## 2018-04-12 DIAGNOSIS — I2510 Atherosclerotic heart disease of native coronary artery without angina pectoris: Secondary | ICD-10-CM

## 2018-04-12 DIAGNOSIS — M549 Dorsalgia, unspecified: Secondary | ICD-10-CM | POA: Diagnosis not present

## 2018-04-12 DIAGNOSIS — R609 Edema, unspecified: Secondary | ICD-10-CM

## 2018-04-12 DIAGNOSIS — Z1212 Encounter for screening for malignant neoplasm of rectum: Secondary | ICD-10-CM

## 2018-04-12 DIAGNOSIS — E1169 Type 2 diabetes mellitus with other specified complication: Secondary | ICD-10-CM

## 2018-04-12 DIAGNOSIS — E1159 Type 2 diabetes mellitus with other circulatory complications: Secondary | ICD-10-CM

## 2018-04-12 DIAGNOSIS — Z9889 Other specified postprocedural states: Secondary | ICD-10-CM | POA: Diagnosis not present

## 2018-04-12 DIAGNOSIS — E785 Hyperlipidemia, unspecified: Secondary | ICD-10-CM

## 2018-04-12 DIAGNOSIS — G8929 Other chronic pain: Secondary | ICD-10-CM | POA: Diagnosis not present

## 2018-04-12 DIAGNOSIS — I10 Essential (primary) hypertension: Secondary | ICD-10-CM | POA: Diagnosis not present

## 2018-04-12 DIAGNOSIS — R600 Localized edema: Secondary | ICD-10-CM

## 2018-04-12 DIAGNOSIS — Z720 Tobacco use: Secondary | ICD-10-CM | POA: Diagnosis not present

## 2018-04-12 LAB — BAYER DCA HB A1C WAIVED: HB A1C (BAYER DCA - WAIVED): 6.6 % (ref <7.0)

## 2018-04-12 MED ORDER — BLOOD GLUCOSE METER KIT: PACK | 0 refills | Status: AC

## 2018-04-12 NOTE — Progress Notes (Signed)
Subjective:    Patient ID: Daniel Short, male    DOB: 09/24/1945, 73 y.o.   MRN: [id number]  Chief Complaint  Patient presents with  . Medical Management of Chronic Issues    diabetes   PT presents to the office today for chronic follow up. He is followed by Pain clinic for chronic back pain. Pt is followed by Cardiologists annually for CAD.  Diabetes  He presents for his follow-up diabetic visit. He has type 2 diabetes mellitus. His disease course has been stable. Hypoglycemia symptoms include headaches. Associated symptoms include visual change. Pertinent negatives for diabetes include no blurred vision, no chest pain and no foot paresthesias. Symptoms are stable. Diabetic complications include heart disease and peripheral neuropathy (hands). Risk factors for coronary artery disease include dyslipidemia and diabetes mellitus. Home blood sugar record trend: does not check blood sugars. Eye exam is not current.  Hyperlipidemia  This is a chronic problem. The current episode started more than 1 year ago. The problem is controlled. Recent lipid tests were reviewed and are normal. Exacerbating diseases include obesity. Associated symptoms include leg pain and shortness of breath. Pertinent negatives include no chest pain. Current antihyperlipidemic treatment includes statins. The current treatment provides moderate improvement of lipids. Risk factors for coronary artery disease include dyslipidemia, diabetes mellitus, male sex, a sedentary lifestyle and hypertension.  Hypertension  This is a chronic problem. The current episode started more than 1 year ago. The problem has been resolved since onset. The problem is controlled. Associated symptoms include headaches, malaise/fatigue, peripheral edema and shortness of breath. Pertinent negatives include no blurred vision or chest pain. Risk factors for coronary artery disease include dyslipidemia, diabetes mellitus, obesity and male gender. The current  treatment provides moderate improvement.  Back Pain  This is a chronic problem. The current episode started more than 1 year ago. The problem occurs intermittently. The problem has been waxing and waning since onset. The pain is present in the lumbar spine. The quality of the pain is described as aching. The pain is at a severity of 10/10. The pain is moderate. Associated symptoms include headaches and leg pain. Pertinent negatives include no chest pain or dysuria. He has tried analgesics for the symptoms. The treatment provided mild relief.      Review of Systems  Constitutional: Positive for malaise/fatigue.  Eyes: Negative for blurred vision.  Respiratory: Positive for shortness of breath.   Cardiovascular: Negative for chest pain.  Genitourinary: Negative for dysuria.  Musculoskeletal: Positive for back pain.  Neurological: Positive for headaches.  All other systems reviewed and are negative.      Objective:   Physical Exam Vitals signs reviewed.  Constitutional:      General: He is not in acute distress.    Appearance: He is well-developed.  HENT:     Head: Normocephalic.     Right Ear: External ear normal.     Left Ear: External ear normal.  Eyes:     General:        Right eye: No discharge.        Left eye: No discharge.     Pupils: Pupils are equal, round, and reactive to light.  Neck:     Musculoskeletal: Normal range of motion and neck supple.     Thyroid: No thyromegaly.  Cardiovascular:     Rate and Rhythm: Normal rate and regular rhythm.     Heart sounds: Normal heart sounds. No murmur.  Pulmonary:     Effort:  Pulmonary effort is normal. No respiratory distress.     Breath sounds: Normal breath sounds. No wheezing.  Abdominal:     General: Bowel sounds are normal. There is no distension.     Palpations: Abdomen is soft.     Tenderness: There is no abdominal tenderness.  Musculoskeletal:        General: No tenderness.     Right lower leg: Edema (trace)  present.     Left lower leg: Edema (2+) present.     Comments: Pain in lumbar with flexion and extension  Skin:    General: Skin is warm and dry.     Findings: No erythema or rash.  Neurological:     Mental Status: He is alert and oriented to person, place, and time.     Cranial Nerves: No cranial nerve deficit.     Deep Tendon Reflexes: Reflexes are normal and symmetric.  Psychiatric:        Behavior: Behavior normal.        Thought Content: Thought content normal.        Judgment: Judgment normal.       BP 130/66   Pulse 60   Temp (!) 97.1 F (36.2 C) (Oral)   Ht 5' 5"  (1.651 m)   Wt 228 lb 9.6 oz (103.7 kg)   BMI 38.04 kg/m      Assessment & Plan:  KAILYN DUBIE comes in today with chief complaint of Medical Management of Chronic Issues (diabetes)   Diagnosis and orders addressed:  1. Type 2 diabetes mellitus with other specified complication, without long-term current use of insulin (HCC) - CMP14+EGFR - CBC with Differential/Platelet - Microalbumin / creatinine urine ratio - Bayer DCA Hb A1c Waived  2. Coronary artery disease involving native coronary artery of native heart without angina pectoris - CMP14+EGFR - CBC with Differential/Platelet  3. Hypertension associated with diabetes ([place]) - CMP14+EGFR - CBC with Differential/Platelet  4. Hyperlipidemia associated with type 2 diabetes mellitus ([place]) - CMP14+EGFR - CBC with Differential/Platelet - Lipid panel  5. Morbid obesity ([place]) - CMP14+EGFR - CBC with Differential/Platelet  6. Chronic back pain, unspecified back location, unspecified back pain laterality - CMP14+EGFR - CBC with Differential/Platelet  7. Status post lumbar surgery - CMP14+EGFR - CBC with Differential/Platelet  8. Tobacco abuse - CMP14+EGFR - CBC with Differential/Platelet  9. Colon cancer screening - Cologuard  10. Screening for malignant neoplasm of the rectum - Cologuard  11. Peripheral edema Low salt diet Keep  feet elevated - Compression stockings  Labs pending Health Maintenance reviewed Diet and exercise encouraged  Follow up plan: 6 months   Evelina Dun, FNP

## 2018-04-12 NOTE — Patient Instructions (Signed)
Health Maintenance After Age 74 After age 74, you are at a higher risk for certain long-term diseases and infections as well as injuries from falls. Falls are a major cause of broken bones and head injuries in people who are older than age 74. Getting regular preventive care can help to keep you healthy and well. Preventive care includes getting regular testing and making lifestyle changes as recommended by your health care provider. Talk with your health care provider about:  Which screenings and tests you should have. A screening is a test that checks for a disease when you have no symptoms.  A diet and exercise plan that is right for you. What should I know about screenings and tests to prevent falls? Screening and testing are the best ways to find a health problem early. Early diagnosis and treatment give you the best chance of managing medical conditions that are common after age 74. Certain conditions and lifestyle choices may make you more likely to have a fall. Your health care provider may recommend:  Regular vision checks. Poor vision and conditions such as cataracts can make you more likely to have a fall. If you wear glasses, make sure to get your prescription updated if your vision changes.  Medicine review. Work with your health care provider to regularly review all of the medicines you are taking, including over-the-counter medicines. Ask your health care provider about any side effects that may make you more likely to have a fall. Tell your health care provider if any medicines that you take make you feel dizzy or sleepy.  Osteoporosis screening. Osteoporosis is a condition that causes the bones to get weaker. This can make the bones weak and cause them to break more easily.  Blood pressure screening. Blood pressure changes and medicines to control blood pressure can make you feel dizzy.  Strength and balance checks. Your health care provider may recommend certain tests to check your  strength and balance while standing, walking, or changing positions.  Foot health exam. Foot pain and numbness, as well as not wearing proper footwear, can make you more likely to have a fall.  Depression screening. You may be more likely to have a fall if you have a fear of falling, feel emotionally low, or feel unable to do activities that you used to do.  Alcohol use screening. Using too much alcohol can affect your balance and may make you more likely to have a fall. What actions can I take to lower my risk of falls? General instructions  Talk with your health care provider about your risks for falling. Tell your health care provider if: ? You fall. Be sure to tell your health care provider about all falls, even ones that seem minor. ? You feel dizzy, sleepy, or off-balance.  Take over-the-counter and prescription medicines only as told by your health care provider. These include any supplements.  Eat a healthy diet and maintain a healthy weight. A healthy diet includes low-fat dairy products, low-fat (lean) meats, and fiber from whole grains, beans, and lots of fruits and vegetables. Home safety  Remove any tripping hazards, such as rugs, cords, and clutter.  Install safety equipment such as grab bars in bathrooms and safety rails on stairs.  Keep rooms and walkways well-lit. Activity   Follow a regular exercise program to stay fit. This will help you maintain your balance. Ask your health care provider what types of exercise are appropriate for you.  If you need a cane or   walker, use it as recommended by your health care provider.  Wear supportive shoes that have nonskid soles. Lifestyle  Do not drink alcohol if your health care provider tells you not to drink.  If you drink alcohol, limit how much you have: ? 0-1 drink a day for women. ? 0-2 drinks a day for men.  Be aware of how much alcohol is in your drink. In the [place] one drink equals one typical bottle of beer (12  oz), one-half glass of wine (5 oz), or one shot of hard liquor (1 oz).  Do not use any products that contain nicotine or tobacco, such as cigarettes and e-cigarettes. If you need help quitting, ask your health care provider. Summary  Having a healthy lifestyle and getting preventive care can help to protect your health and wellness after age 74.  Screening and testing are the best way to find a health problem early and help you avoid having a fall. Early diagnosis and treatment give you the best chance for managing medical conditions that are more common for people who are older than age 74.  Falls are a major cause of broken bones and head injuries in people who are older than age 74. Take precautions to prevent a fall at home.  Work with your health care provider to learn what changes you can make to improve your health and wellness and to prevent falls. This information is not intended to replace advice given to you by your health care provider. Make sure you discuss any questions you have with your health care provider. Document Released: 01/25/2017 Document Revised: 01/25/2017 Document Reviewed: 01/25/2017 Elsevier Interactive Patient Education  2019 [employer]  

## 2018-04-13 LAB — MICROALBUMIN / CREATININE URINE RATIO
Creatinine, Urine: 242.4 mg/dL
Microalb/Creat Ratio: 21.2 mg/g creat (ref 0.0–30.0)
Microalbumin, Urine: 51.4 ug/mL

## 2018-04-13 LAB — CBC WITH DIFFERENTIAL/PLATELET
BASOS ABS: 0.1 10*3/uL (ref 0.0–0.2)
Basos: 1 %
EOS (ABSOLUTE): 0.1 10*3/uL (ref 0.0–0.4)
Eos: 1 %
HEMATOCRIT: 44.3 % (ref 37.5–51.0)
HEMOGLOBIN: 14.6 g/dL (ref 13.0–17.7)
IMMATURE GRANULOCYTES: 0 %
Immature Grans (Abs): 0 10*3/uL (ref 0.0–0.1)
Lymphocytes Absolute: 2.9 10*3/uL (ref 0.7–3.1)
Lymphs: 35 %
MCH: 29.9 pg (ref 26.6–33.0)
MCHC: 33 g/dL (ref 31.5–35.7)
MCV: 91 fL (ref 79–97)
MONOCYTES: 9 %
Monocytes Absolute: 0.7 10*3/uL (ref 0.1–0.9)
NEUTROS PCT: 54 %
Neutrophils Absolute: 4.3 10*3/uL (ref 1.4–7.0)
Platelets: 246 10*3/uL (ref 150–450)
RBC: 4.88 x10E6/uL (ref 4.14–5.80)
RDW: 12.1 % (ref 11.6–15.4)
WBC: 8.1 10*3/uL (ref 3.4–10.8)

## 2018-04-13 LAB — LIPID PANEL
Chol/HDL Ratio: 4 ratio (ref 0.0–5.0)
Cholesterol, Total: 128 mg/dL (ref 100–199)
HDL: 32 mg/dL — ABNORMAL LOW (ref >39)
LDL Calculated: 64 mg/dL (ref 0–99)
TRIGLYCERIDES: 162 mg/dL — AB (ref 0–149)
VLDL CHOLESTEROL CAL: 32 mg/dL (ref 5–40)

## 2018-04-13 LAB — CMP14+EGFR
ALK PHOS: 67 IU/L (ref 39–117)
ALT: 30 IU/L (ref 0–44)
AST: 29 IU/L (ref 0–40)
Albumin/Globulin Ratio: 1.7 (ref 1.2–2.2)
Albumin: 4.4 g/dL (ref 3.5–4.8)
BILIRUBIN TOTAL: 1.6 mg/dL — AB (ref 0.0–1.2)
BUN/Creatinine Ratio: 12 (ref 10–24)
BUN: 11 mg/dL (ref 8–27)
CHLORIDE: 99 mmol/L (ref 96–106)
CO2: 23 mmol/L (ref 20–29)
CREATININE: 0.91 mg/dL (ref 0.76–1.27)
Calcium: 9.6 mg/dL (ref 8.6–10.2)
GFR calc Af Amer: 96 mL/min/{1.73_m2} (ref >59)
GFR calc non Af Amer: 83 mL/min/{1.73_m2} (ref >59)
GLUCOSE: 142 mg/dL — AB (ref 65–99)
Globulin, Total: 2.6 g/dL (ref 1.5–4.5)
Potassium: 4.6 mmol/L (ref 3.5–5.2)
Sodium: 139 mmol/L (ref 134–144)
Total Protein: 7 g/dL (ref 6.0–8.5)

## 2018-06-23 ENCOUNTER — Other Ambulatory Visit: Payer: Self-pay | Admitting: Family

## 2018-06-23 DIAGNOSIS — E118 Type 2 diabetes mellitus with unspecified complications: Secondary | ICD-10-CM

## 2018-07-02 ENCOUNTER — Other Ambulatory Visit: Payer: Self-pay | Admitting: Family

## 2018-07-02 DIAGNOSIS — M109 Gout, unspecified: Secondary | ICD-10-CM

## 2018-07-23 DIAGNOSIS — I10 Essential (primary) hypertension: Secondary | ICD-10-CM | POA: Diagnosis not present

## 2018-07-23 DIAGNOSIS — G8929 Other chronic pain: Secondary | ICD-10-CM | POA: Diagnosis not present

## 2018-07-23 DIAGNOSIS — E119 Type 2 diabetes mellitus without complications: Secondary | ICD-10-CM | POA: Diagnosis not present

## 2018-07-23 DIAGNOSIS — M5136 Other intervertebral disc degeneration, lumbar region: Secondary | ICD-10-CM | POA: Diagnosis not present

## 2018-07-23 DIAGNOSIS — I252 Old myocardial infarction: Secondary | ICD-10-CM | POA: Diagnosis not present

## 2018-07-23 DIAGNOSIS — E785 Hyperlipidemia, unspecified: Secondary | ICD-10-CM | POA: Diagnosis not present

## 2018-07-23 DIAGNOSIS — I2510 Atherosclerotic heart disease of native coronary artery without angina pectoris: Secondary | ICD-10-CM | POA: Diagnosis not present

## 2018-07-23 DIAGNOSIS — Z79899 Other long term (current) drug therapy: Secondary | ICD-10-CM | POA: Diagnosis not present

## 2018-07-27 ENCOUNTER — Other Ambulatory Visit: Payer: Self-pay

## 2018-07-31 ENCOUNTER — Encounter: Payer: Self-pay | Admitting: Family

## 2018-07-31 ENCOUNTER — Ambulatory Visit (INDEPENDENT_AMBULATORY_CARE_PROVIDER_SITE_OTHER): Payer: Medicare HMO | Admitting: Family

## 2018-07-31 ENCOUNTER — Other Ambulatory Visit: Payer: Self-pay

## 2018-07-31 VITALS — BP 134/71 | HR 72 | Temp 97.1°F | Ht 65.0 in | Wt 231.0 lb

## 2018-07-31 DIAGNOSIS — M549 Dorsalgia, unspecified: Secondary | ICD-10-CM

## 2018-07-31 DIAGNOSIS — I10 Essential (primary) hypertension: Secondary | ICD-10-CM

## 2018-07-31 DIAGNOSIS — G8929 Other chronic pain: Secondary | ICD-10-CM

## 2018-07-31 DIAGNOSIS — E1159 Type 2 diabetes mellitus with other circulatory complications: Secondary | ICD-10-CM

## 2018-07-31 DIAGNOSIS — Z720 Tobacco use: Secondary | ICD-10-CM

## 2018-07-31 DIAGNOSIS — I2510 Atherosclerotic heart disease of native coronary artery without angina pectoris: Secondary | ICD-10-CM | POA: Diagnosis not present

## 2018-07-31 DIAGNOSIS — Z87891 Personal history of nicotine dependence: Secondary | ICD-10-CM

## 2018-07-31 DIAGNOSIS — M109 Gout, unspecified: Secondary | ICD-10-CM

## 2018-07-31 DIAGNOSIS — E1169 Type 2 diabetes mellitus with other specified complication: Secondary | ICD-10-CM | POA: Diagnosis not present

## 2018-07-31 DIAGNOSIS — E785 Hyperlipidemia, unspecified: Secondary | ICD-10-CM

## 2018-07-31 LAB — BAYER DCA HB A1C WAIVED: HB A1C (BAYER DCA - WAIVED): 6.9 % (ref <7.0)

## 2018-07-31 NOTE — Progress Notes (Signed)
Subjective:    Patient ID: Daniel Short, male    DOB: [birth year], 73 y.o.   MRN: [id number]  Chief Complaint  Patient presents with  . Medical Management of Chronic Issues  . Diabetes   PT presents to the office today for chronic follow up. He is followed by Pain clinic for chronic back pain. Pt has CAD and had a MI about "10 years ago". States it has been years since he has seen a Cardiologists.  Diabetes  He presents for his follow-up diabetic visit. He has type 2 diabetes mellitus. His disease course has been stable. There are no hypoglycemic associated symptoms. Associated symptoms include foot paresthesias and visual change. Pertinent negatives for diabetes include no blurred vision. Symptoms are stable. Diabetic complications include heart disease and peripheral neuropathy. Pertinent negatives for diabetic complications include no CVA. Risk factors for coronary artery disease include diabetes mellitus, dyslipidemia, male sex, hypertension and sedentary lifestyle. He is following a generally unhealthy diet. (Does not check BS at home ) Eye exam is not current.  Hypertension  This is a chronic problem. The current episode started more than 1 year ago. The problem has been waxing and waning since onset. The problem is uncontrolled. Associated symptoms include malaise/fatigue and peripheral edema. Pertinent negatives include no blurred vision or shortness of breath. Risk factors for coronary artery disease include diabetes mellitus, dyslipidemia, obesity, male gender and sedentary lifestyle. The current treatment provides moderate improvement. There is no history of CVA.  Hyperlipidemia  This is a chronic problem. The current episode started more than 1 year ago. The problem is controlled. Recent lipid tests were reviewed and are normal. Exacerbating diseases include obesity. Pertinent negatives include no shortness of breath. Current antihyperlipidemic treatment includes statins. The current  treatment provides moderate improvement of lipids. Risk factors for coronary artery disease include diabetes mellitus, dyslipidemia, male sex, hypertension and a sedentary lifestyle.  Gout Takes allopurinol daily. States he is unsure when his last flare up was.     Review of Systems  Constitutional: Positive for malaise/fatigue.  Eyes: Negative for blurred vision.  Respiratory: Negative for shortness of breath.   All other systems reviewed and are negative.      Objective:   Physical Exam Vitals signs reviewed.  Constitutional:      General: He is not in acute distress.    Appearance: He is well-developed.  HENT:     Head: Normocephalic.     Right Ear: Tympanic membrane normal.     Left Ear: Tympanic membrane normal.  Eyes:     General:        Right eye: No discharge.        Left eye: No discharge.     Pupils: Pupils are equal, round, and reactive to light.  Neck:     Musculoskeletal: Normal range of motion and neck supple.     Thyroid: No thyromegaly.  Cardiovascular:     Rate and Rhythm: Normal rate and regular rhythm.     Heart sounds: Normal heart sounds. No murmur.  Pulmonary:     Effort: Pulmonary effort is normal. No respiratory distress.     Breath sounds: Normal breath sounds. No wheezing.  Abdominal:     General: Bowel sounds are normal. There is no distension.     Palpations: Abdomen is soft.     Tenderness: There is no abdominal tenderness.  Musculoskeletal:        General: No tenderness.     Right lower  leg: Edema (trace) present.     Comments: Pain in lower back with flexion or extension  Skin:    General: Skin is warm and dry.     Findings: No erythema or rash.  Neurological:     Mental Status: He is alert and oriented to person, place, and time.     Cranial Nerves: No cranial nerve deficit.     Deep Tendon Reflexes: Reflexes are normal and symmetric.  Psychiatric:        Mood and Affect: Affect is flat.        Behavior: Behavior normal.         Thought Content: Thought content normal.        Judgment: Judgment normal.      BP 134/71   Pulse 72   Temp (!) 97.1 F (36.2 C) (Oral)   Ht _0  (1.651 m)   Wt 231 lb (104.8 kg)   BMI 38.44 kg/m      Assessment & Plan:  Daniel Short comes in today with chief complaint of Medical Management of Chronic Issues and Diabetes   Diagnosis and orders addressed:  1. Coronary artery disease involving native coronary artery of native heart without angina pectoris - CMP14+EGFR - CBC with Differential/Platelet - Ambulatory referral to Cardiology  2. Type 2 diabetes mellitus with other specified complication, without long-term current use of insulin (HCC) - CMP14+EGFR - CBC with Differential/Platelet - Bayer DCA Hb A1c Waived  3. Acute gout involving toe of right foot, unspecified cause - CMP14+EGFR - CBC with Differential/Platelet - Uric acid  4. HX Tobacco abuse - CMP14+EGFR - CBC with Differential/Platelet  5. Morbid obesity ([place]) - CMP14+EGFR - CBC with Differential/Platelet  6. Hypertension associated with diabetes ([place]) - CMP14+EGFR - CBC with Differential/Platelet  7. Hyperlipidemia associated with type 2 diabetes mellitus (HCC) - CMP14+EGFR - CBC with Differential/Platelet  8. Chronic back pain, unspecified back location, unspecified back pain laterality   Labs pending Health Maintenance reviewed Diet and exercise encouraged  Follow up plan: 4 months    Evelina Dun, FNP

## 2018-07-31 NOTE — Patient Instructions (Signed)
Health Maintenance After Age 74 After age 74, you are at a higher risk for certain long-term diseases and infections as well as injuries from falls. Falls are a major cause of broken bones and head injuries in people who are older than age 74. Getting regular preventive care can help to keep you healthy and well. Preventive care includes getting regular testing and making lifestyle changes as recommended by your health care provider. Talk with your health care provider about:  Which screenings and tests you should have. A screening is a test that checks for a disease when you have no symptoms.  A diet and exercise plan that is right for you. What should I know about screenings and tests to prevent falls? Screening and testing are the best ways to find a health problem early. Early diagnosis and treatment give you the best chance of managing medical conditions that are common after age 74. Certain conditions and lifestyle choices may make you more likely to have a fall. Your health care provider may recommend:  Regular vision checks. Poor vision and conditions such as cataracts can make you more likely to have a fall. If you wear glasses, make sure to get your prescription updated if your vision changes.  Medicine review. Work with your health care provider to regularly review all of the medicines you are taking, including over-the-counter medicines. Ask your health care provider about any side effects that may make you more likely to have a fall. Tell your health care provider if any medicines that you take make you feel dizzy or sleepy.  Osteoporosis screening. Osteoporosis is a condition that causes the bones to get weaker. This can make the bones weak and cause them to break more easily.  Blood pressure screening. Blood pressure changes and medicines to control blood pressure can make you feel dizzy.  Strength and balance checks. Your health care provider may recommend certain tests to check your  strength and balance while standing, walking, or changing positions.  Foot health exam. Foot pain and numbness, as well as not wearing proper footwear, can make you more likely to have a fall.  Depression screening. You may be more likely to have a fall if you have a fear of falling, feel emotionally low, or feel unable to do activities that you used to do.  Alcohol use screening. Using too much alcohol can affect your balance and may make you more likely to have a fall. What actions can I take to lower my risk of falls? General instructions  Talk with your health care provider about your risks for falling. Tell your health care provider if: ? You fall. Be sure to tell your health care provider about all falls, even ones that seem minor. ? You feel dizzy, sleepy, or off-balance.  Take over-the-counter and prescription medicines only as told by your health care provider. These include any supplements.  Eat a healthy diet and maintain a healthy weight. A healthy diet includes low-fat dairy products, low-fat (lean) meats, and fiber from whole grains, beans, and lots of fruits and vegetables. Home safety  Remove any tripping hazards, such as rugs, cords, and clutter.  Install safety equipment such as grab bars in bathrooms and safety rails on stairs.  Keep rooms and walkways well-lit. Activity   Follow a regular exercise program to stay fit. This will help you maintain your balance. Ask your health care provider what types of exercise are appropriate for you.  If you need a cane or   walker, use it as recommended by your health care provider.  Wear supportive shoes that have nonskid soles. Lifestyle  Do not drink alcohol if your health care provider tells you not to drink.  If you drink alcohol, limit how much you have: ? 0-1 drink a day for women. ? 0-2 drinks a day for men.  Be aware of how much alcohol is in your drink. In the [place] one drink equals one typical bottle of beer (12  oz), one-half glass of wine (5 oz), or one shot of hard liquor (1 oz).  Do not use any products that contain nicotine or tobacco, such as cigarettes and e-cigarettes. If you need help quitting, ask your health care provider. Summary  Having a healthy lifestyle and getting preventive care can help to protect your health and wellness after age 74.  Screening and testing are the best way to find a health problem early and help you avoid having a fall. Early diagnosis and treatment give you the best chance for managing medical conditions that are more common for people who are older than age 74.  Falls are a major cause of broken bones and head injuries in people who are older than age 74. Take precautions to prevent a fall at home.  Work with your health care provider to learn what changes you can make to improve your health and wellness and to prevent falls. This information is not intended to replace advice given to you by your health care provider. Make sure you discuss any questions you have with your health care provider. Document Released: 01/25/2017 Document Revised: 01/25/2017 Document Reviewed: 01/25/2017 Elsevier Interactive Patient Education  2019 [employer]  

## 2018-08-01 LAB — CMP14+EGFR
ALT: 32 IU/L (ref 0–44)
AST: 35 IU/L (ref 0–40)
Albumin/Globulin Ratio: 1.7 (ref 1.2–2.2)
Albumin: 4.2 g/dL (ref 3.7–4.7)
Alkaline Phosphatase: 68 IU/L (ref 39–117)
BUN/Creatinine Ratio: 7 — ABNORMAL LOW (ref 10–24)
BUN: 7 mg/dL — ABNORMAL LOW (ref 8–27)
Bilirubin Total: 1.1 mg/dL (ref 0.0–1.2)
CO2: 21 mmol/L (ref 20–29)
Calcium: 9.8 mg/dL (ref 8.6–10.2)
Chloride: 101 mmol/L (ref 96–106)
Creatinine, Ser: 1.01 mg/dL (ref 0.76–1.27)
GFR calc Af Amer: 85 mL/min/{1.73_m2} (ref >59)
GFR calc non Af Amer: 73 mL/min/{1.73_m2} (ref >59)
Globulin, Total: 2.5 g/dL (ref 1.5–4.5)
Glucose: 176 mg/dL — ABNORMAL HIGH (ref 65–99)
Potassium: 4.4 mmol/L (ref 3.5–5.2)
Sodium: 139 mmol/L (ref 134–144)
Total Protein: 6.7 g/dL (ref 6.0–8.5)

## 2018-08-01 LAB — CBC WITH DIFFERENTIAL/PLATELET
Basophils Absolute: 0.1 10*3/uL (ref 0.0–0.2)
Basos: 1 %
EOS (ABSOLUTE): 0.1 10*3/uL (ref 0.0–0.4)
Eos: 2 %
Hematocrit: 44.2 % (ref 37.5–51.0)
Hemoglobin: 15.1 g/dL (ref 13.0–17.7)
Immature Grans (Abs): 0 10*3/uL (ref 0.0–0.1)
Immature Granulocytes: 0 %
Lymphocytes Absolute: 2.8 10*3/uL (ref 0.7–3.1)
Lymphs: 35 %
MCH: 31.1 pg (ref 26.6–33.0)
MCHC: 34.2 g/dL (ref 31.5–35.7)
MCV: 91 fL (ref 79–97)
Monocytes Absolute: 0.8 10*3/uL (ref 0.1–0.9)
Monocytes: 10 %
Neutrophils Absolute: 4.2 10*3/uL (ref 1.4–7.0)
Neutrophils: 52 %
Platelets: 210 10*3/uL (ref 150–450)
RBC: 4.86 x10E6/uL (ref 4.14–5.80)
RDW: 12 % (ref 11.6–15.4)
WBC: 8 10*3/uL (ref 3.4–10.8)

## 2018-08-01 LAB — URIC ACID: Uric Acid: 5.7 mg/dL (ref 3.7–8.6)

## 2018-08-07 ENCOUNTER — Telehealth: Payer: Self-pay | Admitting: Family

## 2018-08-07 NOTE — Telephone Encounter (Signed)
Wife aware of lab results per dpr.  

## 2018-08-07 NOTE — Telephone Encounter (Signed)
PT wife is calling to get lab results

## 2018-09-04 ENCOUNTER — Other Ambulatory Visit: Payer: Self-pay | Admitting: Physician Assistant

## 2018-09-04 ENCOUNTER — Other Ambulatory Visit: Payer: Self-pay

## 2018-09-04 ENCOUNTER — Telehealth: Payer: Self-pay | Admitting: Family

## 2018-09-04 ENCOUNTER — Ambulatory Visit (INDEPENDENT_AMBULATORY_CARE_PROVIDER_SITE_OTHER): Payer: Medicare HMO | Admitting: Physician Assistant

## 2018-09-04 ENCOUNTER — Other Ambulatory Visit: Payer: Medicare HMO

## 2018-09-04 ENCOUNTER — Telehealth: Payer: Self-pay | Admitting: Physician Assistant

## 2018-09-04 ENCOUNTER — Encounter: Payer: Self-pay | Admitting: Physician Assistant

## 2018-09-04 DIAGNOSIS — R110 Nausea: Secondary | ICD-10-CM

## 2018-09-04 DIAGNOSIS — N200 Calculus of kidney: Secondary | ICD-10-CM

## 2018-09-04 DIAGNOSIS — R17 Unspecified jaundice: Secondary | ICD-10-CM

## 2018-09-04 DIAGNOSIS — R109 Unspecified abdominal pain: Secondary | ICD-10-CM

## 2018-09-04 LAB — MICROSCOPIC EXAMINATION
Bacteria, UA: NONE SEEN
Epithelial Cells (non renal): NONE SEEN /hpf (ref 0–10)
Renal Epithel, UA: NONE SEEN /hpf
WBC, UA: NONE SEEN /hpf (ref 0–5)

## 2018-09-04 LAB — URINALYSIS, COMPLETE
Glucose, UA: NEGATIVE
Ketones, UA: NEGATIVE
Leukocytes,UA: NEGATIVE
Nitrite, UA: NEGATIVE
Specific Gravity, UA: 1.015 (ref 1.005–1.030)
Urobilinogen, Ur: 0.2 mg/dL (ref 0.2–1.0)
pH, UA: 6 (ref 5.0–7.5)

## 2018-09-04 MED ORDER — TAMSULOSIN HCL 0.4 MG PO CAPS: ORAL_CAPSULE | ORAL | 3 refills | Status: DC

## 2018-09-04 NOTE — Telephone Encounter (Signed)
Please advise on medication

## 2018-09-04 NOTE — Addendum Note (Signed)
Addended by: Terald Sleeper on: 09/04/2018 04:19 PM   Modules accepted: Orders

## 2018-09-04 NOTE — Progress Notes (Addendum)
Telephone visit  Subjective: CC: Abdominal pain, blood in urine PCP: Sharion Balloon, FNP ONG:[id number] R Delisle is a 73 y.o. male calls for telephone consult today. Patient provides verbal consent for consult held via phone.  Patient is identified with 2 separate identifiers.  At this time the entire area is on COVID-19 social distancing and stay home orders are in place.  Patient is of higher risk and therefore we are performing this by a virtual method.  Location of patient: Home Location of provider: [facility] Others present for call: No  This patient is having a visit for having significant abdominal and back pain.  His urine was red for the past day.  The initial pain woke him up at night and was more in the mid chest pain but then it has moved down into his abdomen and through to the back.  He is nauseated and has a decreased appetite.  He has not vomited.  He does not have a history of peptic ulcer disease.  He does have degenerative disc disease.  Addendum: His wife was able to bring a urine down to the office.  We will have this evaluated.  ROS: Per HPI  Allergies  Allergen Reactions  . No Known Allergies    Past Medical History:  Diagnosis Date  . Arthritis   . CAD (coronary artery disease)    with stent placement  . Complication of anesthesia    wife states he woke up during heart stent,; with stroke he tried to put out IV'S  . Coronary artery arteriosclerosis    had stenting in past; not sure when  . CVA (cerebrovascular accident due to intracerebral hemorrhage) ([place])   . Dyspnea    when walking short distances  . Ear infection 1979   right ear due to wax build up , stayed in hospital several days  . Headache   . Heart attack [facility])    developed 3rd degree AVB, inferior ST elevation following 02/15/02 LHC showing 70% RCA--> s/p temporary PPM and proximal/ostial RCA Zeta stents   . Stroke [facility]) 2000   cva due to intracerebral hemorrhage    Current Outpatient  Medications:  .  allopurinol (ZYLOPRIM) 100 MG tablet, TAKE 1 TABLET DAILY, Disp: 90 tablet, Rfl: 0 .  atorvastatin (LIPITOR) 20 MG tablet, Take 1 tablet (20 mg total) by mouth daily., Disp: 90 tablet, Rfl: 3 .  blood glucose meter kit and supplies, Dispense based on patient and insurance preference. Use up to four times daily as directed. (FOR ICD-10 E10.9, E11.9)., Disp: 1 each, Rfl: 0 .  HYDROcodone-acetaminophen (NORCO/VICODIN) 5-325 MG tablet, , Disp: , Rfl:  .  lisinopril (PRINIVIL,ZESTRIL) 20 MG tablet, Take 1 tablet (20 mg total) by mouth daily., Disp: 90 tablet, Rfl: 3 .  metFORMIN (GLUCOPHAGE-XR) 500 MG 24 hr tablet, TAKE 1 TABLET EVERY DAY WITH BREAKFAST, Disp: 90 tablet, Rfl: 0 .  tamsulosin (FLOMAX) 0.4 MG CAPS capsule, Take 2 caps daily to open urine tubes, Disp: 60 capsule, Rfl: 3  Assessment/ Plan: 73 y.o. male   1. Kidney stone - tamsulosin (FLOMAX) 0.4 MG CAPS capsule; Take 2 caps daily to open urine tubes  Dispense: 60 capsule; Refill: 3  2. Nausea fluids  3. Abdominal pain, unspecified abdominal location Start Flomax If pain worsens go to emergency department   Start time: 4:05 PM End time: 4:14 PM  Meds ordered this encounter  Medications  . tamsulosin (FLOMAX) 0.4 MG CAPS capsule  Sig: Take 2 caps daily to open urine tubes    Dispense:  60 capsule    Refill:  3    Order Specific Question:   Supervising Provider    Answer:   Janora Norlander [[id number]]    Particia Nearing PA-C [place] [phone]

## 2018-09-05 ENCOUNTER — Emergency Department (HOSPITAL_COMMUNITY)
Admission: EM | Admit: 2018-09-05 | Discharge: 2018-09-05 | Disposition: A | Discharge status: Home / Self Care | Payer: Medicare HMO | Attending: Emergency Medicine | Admitting: Emergency Medicine

## 2018-09-05 ENCOUNTER — Emergency Department (HOSPITAL_COMMUNITY): Payer: Medicare HMO

## 2018-09-05 ENCOUNTER — Other Ambulatory Visit: Payer: Medicare HMO

## 2018-09-05 ENCOUNTER — Encounter (HOSPITAL_COMMUNITY): Payer: Self-pay

## 2018-09-05 ENCOUNTER — Other Ambulatory Visit: Payer: Self-pay

## 2018-09-05 DIAGNOSIS — R1032 Left lower quadrant pain: Secondary | ICD-10-CM | POA: Insufficient documentation

## 2018-09-05 DIAGNOSIS — R1030 Lower abdominal pain, unspecified: Secondary | ICD-10-CM

## 2018-09-05 DIAGNOSIS — R17 Unspecified jaundice: Secondary | ICD-10-CM

## 2018-09-05 LAB — COMPREHENSIVE METABOLIC PANEL
ALT: 102 U/L — ABNORMAL HIGH (ref 0–44)
AST: 56 U/L — ABNORMAL HIGH (ref 15–41)
Albumin: 3.7 g/dL (ref 3.5–5.0)
Alkaline Phosphatase: 135 U/L — ABNORMAL HIGH (ref 38–126)
Anion gap: 13 (ref 5–15)
BUN: 14 mg/dL (ref 8–23)
CO2: 23 mmol/L (ref 22–32)
Calcium: 8.9 mg/dL (ref 8.9–10.3)
Chloride: 99 mmol/L (ref 98–111)
Creatinine, Ser: 1.73 mg/dL — ABNORMAL HIGH (ref 0.61–1.24)
GFR calc Af Amer: 44 mL/min — ABNORMAL LOW (ref >60)
GFR calc non Af Amer: 38 mL/min — ABNORMAL LOW (ref >60)
Glucose, Bld: 140 mg/dL — ABNORMAL HIGH (ref 70–99)
Potassium: 3.9 mmol/L (ref 3.5–5.1)
Sodium: 135 mmol/L (ref 135–145)
Total Bilirubin: 9.7 mg/dL — ABNORMAL HIGH (ref 0.3–1.2)
Total Protein: 7.2 g/dL (ref 6.5–8.1)

## 2018-09-05 LAB — CBC
HCT: 42.2 % (ref 39.0–52.0)
Hemoglobin: 13.7 g/dL (ref 13.0–17.0)
MCH: 30 pg (ref 26.0–34.0)
MCHC: 32.5 g/dL (ref 30.0–36.0)
MCV: 92.3 fL (ref 80.0–100.0)
Platelets: 199 10*3/uL (ref 150–400)
RBC: 4.57 MIL/uL (ref 4.22–5.81)
RDW: 12.6 % (ref 11.5–15.5)
WBC: 10.1 10*3/uL (ref 4.0–10.5)
nRBC: 0 % (ref 0.0–0.2)

## 2018-09-05 LAB — DIFFERENTIAL
Basophils Absolute: 0 10*3/uL (ref 0.0–0.1)
Basophils Relative: 0 %
Eosinophils Absolute: 0 10*3/uL (ref 0.0–0.5)
Eosinophils Relative: 0 %
Lymphocytes Relative: 18 %
Lymphs Abs: 1.8 10*3/uL (ref 0.7–4.0)
Monocytes Absolute: 1.6 10*3/uL — ABNORMAL HIGH (ref 0.1–1.0)
Monocytes Relative: 16 %
Neutro Abs: 6.6 10*3/uL (ref 1.7–7.7)
Neutrophils Relative %: 65 %

## 2018-09-05 LAB — LIPASE, BLOOD: Lipase: 33 U/L (ref 11–51)

## 2018-09-05 LAB — PROTIME-INR
INR: 1.1 (ref 0.8–1.2)
Prothrombin Time: 14.3 seconds (ref 11.4–15.2)

## 2018-09-05 LAB — BILIRUBIN, FRACTIONATED(TOT/DIR/INDIR)
Bilirubin, Direct: 5.1 mg/dL — ABNORMAL HIGH (ref 0.0–0.2)
Indirect Bilirubin: 4.8 mg/dL — ABNORMAL HIGH (ref 0.3–0.9)
Total Bilirubin: 9.9 mg/dL — ABNORMAL HIGH (ref 0.3–1.2)

## 2018-09-05 MED ORDER — IOHEXOL 300 MG/ML  SOLN
75.0000 mL | Freq: Once | INTRAMUSCULAR | Status: AC | PRN
  Administered 2018-09-05: 75 mL via INTRAVENOUS

## 2018-09-05 MED ORDER — SODIUM CHLORIDE 0.9% FLUSH
3.0000 mL | Freq: Once | INTRAVENOUS | Status: AC
  Administered 2018-09-05: 3 mL via INTRAVENOUS

## 2018-09-05 NOTE — Discharge Instructions (Signed)
Stop taking lipitor,   follow up with dr. Sydell Axon this week.  His office should call you tomorrow

## 2018-09-05 NOTE — Telephone Encounter (Signed)
Aware. 

## 2018-09-05 NOTE — ED Notes (Signed)
Pt reminded we need a urine sample.  

## 2018-09-05 NOTE — ED Notes (Signed)
Pt was informed that we need a urine sample. Pt was given a urinal. 

## 2018-09-05 NOTE — ED Provider Notes (Signed)
[facility] EMERGENCY DEPARTMENT Provider Note   CSN: [id number] Arrival date & time: 09/05/18  1546    History   Chief Complaint Chief Complaint  Patient presents with  . Abdominal Pain    HPI Daniel Short is a 73 y.o. male.     Patient complains of left lower quadrant abdominal discomfort.  For a few days.  No vomiting no diarrhea  The history is provided by the patient. No language interpreter was used.  Abdominal Pain  Pain location:  LLQ Pain quality: aching   Pain radiates to:  Does not radiate Pain severity:  Moderate Onset quality:  Sudden Timing:  Constant Progression:  Worsening Context: not alcohol use   Relieved by:  Nothing Worsened by:  Nothing Associated symptoms: no chest pain, no cough, no diarrhea, no fatigue and no hematuria     Past Medical History:  Diagnosis Date  . Arthritis   . CAD (coronary artery disease)    with stent placement  . Complication of anesthesia    wife states he woke up during heart stent,; with stroke he tried to put out IV'S  . Coronary artery arteriosclerosis    had stenting in past; not sure when  . CVA (cerebrovascular accident due to intracerebral hemorrhage) ([place])   . Dyspnea    when walking short distances  . Ear infection 1979   right ear due to wax build up , stayed in hospital several days  . Headache   . Heart attack [facility])    developed 3rd degree AVB, inferior ST elevation following 02/15/02 LHC showing 70% RCA--> s/p temporary PPM and proximal/ostial RCA Zeta stents   . Stroke [facility]) 2000   cva due to intracerebral hemorrhage    Patient Active Problem List   Diagnosis Date Noted  . Diabetes mellitus ([place]) 12/20/2017  . Gout 12/20/2017  . Hematoma 09/17/2016  . Status post lumbar surgery 09/14/2016  . Back pain 09/14/2016  . Hypertension associated with diabetes ([place]) 05/19/2016  . Morbid obesity ([place]) 11/24/2014  . History of tobacco abuse 11/24/2014  . CAD (coronary artery disease), native  coronary artery 11/27/2013  . Hyperlipidemia associated with type 2 diabetes mellitus ([place]) 11/27/2013    Past Surgical History:  Procedure Laterality Date  . CARPAL TUNNEL RELEASE    . CORONARY STENT PLACEMENT  2005  . HERNIA REPAIR    . KNEE ARTHROSCOPY Right   . LUMBAR LAMINECTOMY/DECOMPRESSION MICRODISCECTOMY N/A 09/14/2016   Procedure: Decompression L3-5, insitu fusion L4-5;  Surgeon: Melina Schools, MD;  Location: [place];  Service: Orthopedics;  Laterality: N/A;  3 hrs  . LUMBAR LAMINECTOMY/DECOMPRESSION MICRODISCECTOMY N/A 09/17/2016   Procedure: WOUND EXPLORATION, GILL DECOMPRESSION,FAR LATERAL DISCECTOMY LEFT L4-L5, AND EVACUATION OF HEMATOMA;  Surgeon: Melina Schools, MD;  Location: [place];  Service: Orthopedics;  Laterality: N/A;        Home Medications    Prior to Admission medications   Medication Sig Start Date End Date Taking? Authorizing Provider  allopurinol (ZYLOPRIM) 100 MG tablet TAKE 1 TABLET DAILY 07/02/18   Evelina Dun A, FNP  atorvastatin (LIPITOR) 20 MG tablet Take 1 tablet (20 mg total) by mouth daily. 12/20/17   Sharion Balloon, FNP  blood glucose meter kit and supplies Dispense based on patient and insurance preference. Use up to four times daily as directed. (FOR ICD-10 E10.9, E11.9). 04/12/18   Sharion Balloon, FNP  HYDROcodone-acetaminophen (NORCO/VICODIN) 5-325 MG tablet  02/13/18   [provider]  lisinopril (PRINIVIL,ZESTRIL) 20 MG  tablet Take 1 tablet (20 mg total) by mouth daily. 12/20/17   Sharion Balloon, FNP  metFORMIN (GLUCOPHAGE-XR) 500 MG 24 hr tablet TAKE 1 TABLET EVERY DAY WITH BREAKFAST 06/25/18   Evelina Dun A, FNP  tamsulosin [facility]) 0.4 MG CAPS capsule Take 2 caps daily to open urine tubes 09/04/18   Terald Sleeper, PA-C    Family History Family History  Problem Relation Age of Onset  . Heart attack Mother   . Heart attack Father   . COPD Father   . Heart attack Brother     Social History Social History   Tobacco Use   . Smoking status: Former Smoker    Packs/day: 0.25    Years: 54.00    Pack years: 13.50    Types: Cigarettes    Last attempt to quit: 08/27/2015    Years since quitting: 3.0  . Smokeless tobacco: Never Used  Substance Use Topics  . Alcohol use: No  . Drug use: No     Allergies   No known allergies   Review of Systems Review of Systems  Constitutional: Negative for appetite change and fatigue.  HENT: Negative for congestion, ear discharge and sinus pressure.   Eyes: Negative for discharge.  Respiratory: Negative for cough.   Cardiovascular: Negative for chest pain.  Gastrointestinal: Positive for abdominal pain. Negative for diarrhea.  Genitourinary: Negative for frequency and hematuria.  Musculoskeletal: Negative for back pain.  Skin: Negative for rash.  Neurological: Negative for seizures and headaches.  Psychiatric/Behavioral: Negative for hallucinations.     Physical Exam Updated Vital Signs BP (!) 125/112   Pulse 71   Temp 97.7 F (36.5 C) (Oral)   Resp 17   Ht 5' 7"  (1.702 m)   Wt 99.8 kg   SpO2 97%   BMI 34.46 kg/m   Physical Exam Vitals signs and nursing note reviewed.  Constitutional:      Appearance: He is well-developed.  HENT:     Head: Normocephalic.     Nose: Nose normal.  Eyes:     General: No scleral icterus.    Conjunctiva/sclera: Conjunctivae normal.  Neck:     Musculoskeletal: Neck supple.     Thyroid: No thyromegaly.  Cardiovascular:     Rate and Rhythm: Normal rate and regular rhythm.     Heart sounds: No murmur. No friction rub. No gallop.   Pulmonary:     Breath sounds: No stridor. No wheezing or rales.  Chest:     Chest wall: No tenderness.  Abdominal:     General: There is no distension.     Tenderness: There is abdominal tenderness. There is no rebound.     Comments: Mild tenderness left lower quadrant  Musculoskeletal: Normal range of motion.  Lymphadenopathy:     Cervical: No cervical adenopathy.  Skin:     Coloration: Skin is jaundiced.     Findings: No erythema or rash.  Neurological:     Mental Status: He is oriented to person, place, and time.     Motor: No abnormal muscle tone.     Coordination: Coordination normal.  Psychiatric:        Behavior: Behavior normal.      ED Treatments / Results  Labs (all labs ordered are listed, but only abnormal results are displayed) Labs Reviewed  COMPREHENSIVE METABOLIC PANEL - Abnormal; Notable for the following components:      Result Value   Glucose, Bld 140 (*)    Creatinine, Ser 1.73 (*)  AST 56 (*)    ALT 102 (*)    Alkaline Phosphatase 135 (*)    Total Bilirubin 9.7 (*)    GFR calc non Af Amer 38 (*)    GFR calc Af Amer 44 (*)    All other components within normal limits  DIFFERENTIAL - Abnormal; Notable for the following components:   Monocytes Absolute 1.6 (*)    All other components within normal limits  LIPASE, BLOOD  CBC  URINALYSIS, ROUTINE W REFLEX MICROSCOPIC  PROTIME-INR  BILIRUBIN, FRACTIONATED(TOT/DIR/INDIR)    EKG None  Radiology Ct Abdomen Pelvis W Contrast  Result Date: 09/05/2018 CLINICAL DATA:  Lower abdominal pain primarily on the left side. Elevated liver enzymes. EXAM: CT ABDOMEN AND PELVIS WITH CONTRAST TECHNIQUE: Multidetector CT imaging of the abdomen and pelvis was performed using the standard protocol following bolus administration of intravenous contrast. CONTRAST:  47m OMNIPAQUE IOHEXOL 300 MG/ML  SOLN COMPARISON:  None. FINDINGS: Lower chest: There are few branching opacities in the lingula, likely representing sequela of an old remote infectious process. Hepatobiliary: There appears to be underlying hepatic steatosis. Liver contour somewhat nodular. There is gallbladder wall thickening without evidence of a radiopaque gallstone. The gallbladder is not significantly distended. Pancreas: Unremarkable. No pancreatic ductal dilatation or surrounding inflammatory changes. Spleen: Normal in size without  focal abnormality. Adrenals/Urinary Tract: Adrenal glands are unremarkable. Kidneys are normal, without renal calculi, focal lesion, or hydronephrosis. Bladder is unremarkable. Stomach/Bowel: There is a small hiatal hernia. Otherwise the stomach is unremarkable. There is no evidence of a small-bowel obstruction. The appendix is located in the right lower quadrant and is normal. There is no CT evidence of diverticulitis or colitis. Vascular/Lymphatic: Aortic atherosclerosis. No enlarged abdominal or pelvic lymph nodes. Reproductive: Prostate is unremarkable. Other: There is a small fat containing right inguinal hernia. Musculoskeletal: Patient is status post prior L4 laminectomy. There is no acute osseous abnormality. There are degenerative changes throughout the lumbar spine. IMPRESSION: 1. Mild gallbladder wall thickening without evidence of a radiopaque gallstone. The gallbladder is not overtly distended. If there is clinical suspicion for acute cholecystitis, follow-up with ultrasound is recommended. 2. Probable underlying hepatic steatosis. There is a somewhat nodular appearance of the liver raising suspicion for cirrhosis. 3. No CT evidence of colitis or acute appendicitis. Electronically Signed   By: CConstance HolsterM.D.   On: 09/05/2018 19:17    Procedures Procedures (including critical care time)  Medications Ordered in ED Medications  sodium chloride flush (NS) 0.9 % injection 3 mL (3 mLs Intravenous Given 09/05/18 1745)  iohexol (OMNIPAQUE) 300 MG/ML solution 75 mL (75 mLs Intravenous Contrast Given 09/05/18 1829)     Initial Impression / Assessment and Plan / ED Course  I have reviewed the triage vital signs and the nursing notes.  Pertinent labs & imaging results that were available during my care of the patient were reviewed by me and considered in my medical decision making (see chart for details).        Patient with elevated bilirubin.  CT scan does not show any cholecystitis  or diverticulitis or colitis.  Patient prefers to go home and states he will not be able to give a urine specimen.  I spoke with Dr. RSydell Axongastroenterologist and he will see the patient this week and follow-up.  We will stop his Lipitor Final Clinical Impressions(s) / ED Diagnoses   Final diagnoses:  Lower abdominal pain    ED Discharge Orders    None  Milton Ferguson, MD 09/05/18 2033

## 2018-09-05 NOTE — ED Triage Notes (Signed)
Pt presents to ED with complaints of abdominal pain which started Monday. Pt states pain is on left side. Pt had labs drawn by his MD at [facility] and liver enzymes were elevated. Pt denies N/V/D

## 2018-09-06 ENCOUNTER — Telehealth: Payer: Self-pay | Admitting: Internal Medicine

## 2018-09-06 LAB — CMP14+EGFR
ALT: 106 IU/L — ABNORMAL HIGH (ref 0–44)
AST: 58 IU/L — ABNORMAL HIGH (ref 0–40)
Albumin/Globulin Ratio: 1.8 (ref 1.2–2.2)
Albumin: 4.1 g/dL (ref 3.7–4.7)
Alkaline Phosphatase: 158 IU/L — ABNORMAL HIGH (ref 39–117)
BUN/Creatinine Ratio: 9 — ABNORMAL LOW (ref 10–24)
BUN: 12 mg/dL (ref 8–27)
Bilirubin Total: 9.1 mg/dL — ABNORMAL HIGH (ref 0.0–1.2)
CO2: 20 mmol/L (ref 20–29)
Calcium: 9 mg/dL (ref 8.6–10.2)
Chloride: 96 mmol/L (ref 96–106)
Creatinine, Ser: 1.34 mg/dL — ABNORMAL HIGH (ref 0.76–1.27)
GFR calc Af Amer: 60 mL/min/{1.73_m2} (ref >59)
GFR calc non Af Amer: 52 mL/min/{1.73_m2} — ABNORMAL LOW (ref >59)
Globulin, Total: 2.3 g/dL (ref 1.5–4.5)
Glucose: 137 mg/dL — ABNORMAL HIGH (ref 65–99)
Potassium: 3.9 mmol/L (ref 3.5–5.2)
Sodium: 136 mmol/L (ref 134–144)
Total Protein: 6.4 g/dL (ref 6.0–8.5)

## 2018-09-06 LAB — URINE CULTURE: Organism ID, Bacteria: NO GROWTH

## 2018-09-06 NOTE — Telephone Encounter (Signed)
Yes, ok to accept patient.

## 2018-09-06 NOTE — Telephone Encounter (Signed)
Pt is aware of appt

## 2018-09-06 NOTE — Telephone Encounter (Signed)
Pt has been to ED for continued pain.

## 2018-09-06 NOTE — Telephone Encounter (Signed)
Please advise if we can accept him as a new patient and is this an urgent issue. Per ER notes the doctor talked with RMR and was told RMR would see patient this week. I have not received anything from RMR about scheduling patient. Please advise and I will call the wife back.

## 2018-09-07 ENCOUNTER — Telehealth: Payer: Self-pay | Admitting: Family

## 2018-09-07 NOTE — Telephone Encounter (Signed)
Spoke with pt regarding lab work Pt verbalizes understanding

## 2018-09-11 ENCOUNTER — Other Ambulatory Visit: Payer: Self-pay

## 2018-09-11 ENCOUNTER — Ambulatory Visit: Payer: Medicare HMO | Admitting: Nurse Practitioner

## 2018-09-11 ENCOUNTER — Encounter: Payer: Self-pay | Admitting: Nurse Practitioner

## 2018-09-11 ENCOUNTER — Telehealth: Payer: Self-pay | Admitting: *Deleted

## 2018-09-11 DIAGNOSIS — R1084 Generalized abdominal pain: Secondary | ICD-10-CM | POA: Diagnosis not present

## 2018-09-11 DIAGNOSIS — R7989 Other specified abnormal findings of blood chemistry: Secondary | ICD-10-CM | POA: Insufficient documentation

## 2018-09-11 DIAGNOSIS — R109 Unspecified abdominal pain: Secondary | ICD-10-CM | POA: Insufficient documentation

## 2018-09-11 DIAGNOSIS — R945 Abnormal results of liver function studies: Secondary | ICD-10-CM

## 2018-09-11 NOTE — Assessment & Plan Note (Addendum)
Patient was seen in emergency department for abdominal pain.  He was found to have elevated LFTs including alk phos of 135, bilirubin 9.7, AST/ALT at 56/22.  Fractionated bilirubin found the total to be 9.9, direct 5.1, indirect 4.8.  CT of the abdomen and pelvis found gallbladder wall thickening without evidence of radiopaque stone, unremarkable pancreas.  Underlying hepatic steatosis and somewhat nodular border with suspicion of some cirrhosis.  Since discharging from the hospital his abdominal pain has improved.  It would appear clinically his bilirubin has likely come down given no obvious jaundice.  We will recheck his HFP and CBC.  We will also check a right upper quadrant ultrasound with elastography for fibrosis as well as Query transient choledocholithiasis or microlithiasis.  Further recommendations to follow.  Follow-up in 6 to 8 weeks. 

## 2018-09-11 NOTE — Progress Notes (Signed)
Primary Care Physician:  Sharion Balloon, FNP Primary Gastroenterologist:  Dr. Gala Romney   Chief Complaint  Patient presents with  . Abdominal Pain    has not had any pain since seen in ED on 6/10    HPI:   Daniel Short is a 73 y.o. male who presents on referral from the emergency department.  Reviewed pertinent information including ER visit dated 09/05/2018 for lower abdominal pain.  At that time he complained of left lower quadrant abdominal pain which was aching, moderate, worsening.  Noted mild tenderness to left lower quadrant on exam.  Labs and imaging as per below. Recommended he hold his Lipitor at that time.   Labs completed 09/05/2018 found mildly elevated creatinine 1.34, significantly elevated bilirubin at 9.1, elevated alkaline phosphatase at 158, elevated AST/ALT of 58/106.  Liver values were previously normal the month prior.  Lipase was normal.  A repeat of his CMP is later that day found mild worsening of creatinine 1.73, stable AST/ALT of 56/102, mildly improved alk phos of 135, worsening bilirubin at 9.7.  CBC was normal.  INR normal at 1.1.  Fractionated bilirubin again found total bilirubin at 9.9, elevated direct bilirubin at 5.1, elevated indirect bilirubin at 4.8.  CT of the abdomen and pelvis completed in the ER found apparent underlying hepatic steatosis and somewhat nodular border.  Gallbladder wall thickening without evidence of a radiopaque stone and no significant distention.  Pancreas unremarkable.  Recommended follow-up with ultrasound if concern for acute cholecystitis.  Some suspicion of cirrhosis.  Today he states he's doing well overall. Denies any abdominal pain since being in the ER, now resolved. Denies N/V. Denies hematochezia, melena, fever, chills, unintentional weight loss. Typically has a bowel movement every 1-2 days, consistent with [place] 4, occasionally a little straining. Drinks 36 oz - 48 oz a day. Doesn't eat much fiber. He states in the ER he  was told he had turned a little yellow. Denies darkened urine at that time. Denies URI or flu-like symptoms. Denies loss of sense of taste or smell. Denies chest pain, dyspnea, dizziness, lightheadedness, syncope, near syncope. Denies any other upper or lower GI symptoms.  Past Medical History:  Diagnosis Date  . Arthritis   . CAD (coronary artery disease)    with stent placement  . Complication of anesthesia    wife states he woke up during heart stent,; with stroke he tried to put out IV'S  . Coronary artery arteriosclerosis    had stenting in past; not sure when  . CVA (cerebrovascular accident due to intracerebral hemorrhage) ([place])   . Dyspnea    when walking short distances  . Ear infection 1979   right ear due to wax build up , stayed in hospital several days  . Headache   . Heart attack [facility])    developed 3rd degree AVB, inferior ST elevation following 02/15/02 LHC showing 70% RCA--> s/p temporary PPM and proximal/ostial RCA Zeta stents   . Stroke [facility]) 2000   cva due to intracerebral hemorrhage    Past Surgical History:  Procedure Laterality Date  . CARPAL TUNNEL RELEASE    . CORONARY STENT PLACEMENT  2005  . HERNIA REPAIR    . KNEE ARTHROSCOPY Right   . LUMBAR LAMINECTOMY/DECOMPRESSION MICRODISCECTOMY N/A 09/14/2016   Procedure: Decompression L3-5, insitu fusion L4-5;  Surgeon: Melina Schools, MD;  Location: [place];  Service: Orthopedics;  Laterality: N/A;  3 hrs  . LUMBAR LAMINECTOMY/DECOMPRESSION MICRODISCECTOMY N/A 09/17/2016  Procedure: WOUND EXPLORATION, Vick Filter DECOMPRESSION,FAR LATERAL DISCECTOMY LEFT L4-L5, AND EVACUATION OF HEMATOMA;  Surgeon: Melina Schools, MD;  Location: [place];  Service: Orthopedics;  Laterality: N/A;    Current Outpatient Medications  Medication Sig Dispense Refill  . allopurinol (ZYLOPRIM) 100 MG tablet TAKE 1 TABLET DAILY 90 tablet 0  . atorvastatin (LIPITOR) 20 MG tablet Take 1 tablet (20 mg total) by mouth daily. 90 tablet 3  . blood  glucose meter kit and supplies Dispense based on patient and insurance preference. Use up to four times daily as directed. (FOR ICD-10 E10.9, E11.9). 1 each 0  . HYDROcodone-acetaminophen (NORCO/VICODIN) 5-325 MG tablet     . lisinopril (PRINIVIL,ZESTRIL) 20 MG tablet Take 1 tablet (20 mg total) by mouth daily. 90 tablet 3  . metFORMIN (GLUCOPHAGE-XR) 500 MG 24 hr tablet TAKE 1 TABLET EVERY DAY WITH BREAKFAST 90 tablet 0  . tamsulosin (FLOMAX) 0.4 MG CAPS capsule Take 2 caps daily to open urine tubes 60 capsule 3   No current facility-administered medications for this visit.     Allergies as of 09/11/2018 - Review Complete 09/11/2018  Allergen Reaction Noted  . No known allergies  09/13/2016    Family History  Problem Relation Age of Onset  . Heart attack Mother   . Heart attack Father   . COPD Father   . Heart attack Brother   . Colon cancer Neg Hx     Social History   Socioeconomic History  . Marital status: Married    Spouse name: Not on file  . Number of children: Not on file  . Years of education: Not on file  . Highest education level: Not on file  Occupational History  . Not on file  Social Needs  . Financial resource strain: Not on file  . Food insecurity    Worry: Not on file    Inability: Not on file  . Transportation needs    Medical: Not on file    Non-medical: Not on file  Tobacco Use  . Smoking status: Former Smoker    Packs/day: 0.25    Years: 54.00    Pack years: 13.50    Types: Cigarettes    Quit date: 08/27/2015    Years since quitting: 3.0  . Smokeless tobacco: Never Used  Substance and Sexual Activity  . Alcohol use: No  . Drug use: No  . Sexual activity: Not on file  Lifestyle  . Physical activity    Days per week: Not on file    Minutes per session: Not on file  . Stress: Not on file  Relationships  . Social Herbalist on phone: Not on file    Gets together: Not on file    Attends religious service: Not on file    Active  member of club or organization: Not on file    Attends meetings of clubs or organizations: Not on file    Relationship status: Not on file  . Intimate partner violence    Fear of current or ex partner: Not on file    Emotionally abused: Not on file    Physically abused: Not on file    Forced sexual activity: Not on file  Other Topics Concern  . Not on file  Social History Narrative  . Not on file    Review of Systems: General: Negative for anorexia, weight loss, fever, chills, fatigue, weakness. ENT: Negative for hoarseness, difficulty swallowing. CV: Negative for chest pain, angina, palpitations, peripheral  edema.  Respiratory: Negative for dyspnea at rest, cough, sputum, wheezing.  GI: See history of present illness. MS: Negative for joint pain, low back pain.  Derm: Negative for rash or itching.  Neuro: Negative for memory loss, confusion.  Endo: Negative for unusual weight change.  Heme: Negative for bruising or bleeding. Allergy: Negative for rash or hives.    Physical Exam: BP (!) 149/88   Pulse 79   Temp (!) 97 F (36.1 C) (Oral)   Ht _0  (1.651 m)   Wt 228 lb (103.4 kg)   BMI 37.94 kg/m  General:   Alert and oriented. Pleasant and cooperative. Well-nourished and well-developed.  Head:  Normocephalic and atraumatic. Eyes:  Without icterus, sclera clear and conjunctiva pink.  Ears:  Normal auditory acuity. Cardiovascular:  S1, S2 present without murmurs appreciated. Extremities without clubbing or edema. Respiratory:  Clear to auscultation bilaterally. No wheezes, rales, or rhonchi. No distress.  Gastrointestinal:  +BS, soft, non-tender and non-distended. No HSM noted. No guarding or rebound. No masses appreciated.  Rectal:  Deferred  Musculoskalatal:  Symmetrical without gross deformities. Neurologic:  Alert and oriented x4;  grossly normal neurologically. Psych:  Alert and cooperative. Normal mood and affect. Heme/Lymph/Immune: No excessive bruising noted.     09/11/2018 9:50 AM   Disclaimer: This note was dictated with voice recognition software. Similar sounding words can inadvertently be transcribed and may not be corrected upon review.

## 2018-09-11 NOTE — Telephone Encounter (Signed)
Patient and spouse called back and is aware of appt details

## 2018-09-11 NOTE — Patient Instructions (Signed)
Your health issues we discussed today were:   Increased liver enzymes and abdominal pain: 1. I am glad your abdominal pain is better! 2. I will recheck your labs including your liver enzymes.  Have that drawn today 3. I will put in for an ultrasound of your liver to evaluate your liver and gallbladder 4. We will call you with the results 5. Based on your results we can make further recommendations 6. Continue to hold your Lipitor for now  Overall I recommend:  1. Continue your other current medications 2. Return for follow-up in 6 to 8 weeks 3. Call us if you have any questions or concerns.  Because of recent events of COVID-19 ("Coronavirus"), follow CDC recommendations:  1. Wash your hand frequently 2. Avoid touching your face 3. Stay away from people who are sick 4. If you have symptoms such as fever, cough, shortness of breath then call your healthcare provider for further guidance 5. If you are sick, STAY AT HOME unless otherwise directed by your healthcare provider. 6. Follow directions from state and national officials regarding staying safe   At [facility] we value your feedback. You may receive a survey about your visit today. Please share your experience as we strive to create trusting relationships with our patients to provide genuine, compassionate, quality care.  We appreciate your understanding and patience as we review any laboratory studies, imaging, and other diagnostic tests that are ordered as we care for you. Our office policy is 5 business days for review of these results, and any emergent or urgent results are addressed in a timely manner for your best interest. If you do not hear from our office in 1 week, please contact us.   We also encourage the use of MyChart, which contains your medical information for your review as well. If you are not enrolled in this feature, an access code is on this after visit summary for your convenience. Thank you for  allowing [place] to be involved in your care.  It was great to see you today!  I hope you have a great summer!!

## 2018-09-11 NOTE — Telephone Encounter (Signed)
U/S scheduled for 6/19 at 9:30am, arrival time 9:15am, npo after midnight  Called patient-LMOVM

## 2018-09-11 NOTE — Progress Notes (Signed)
cc'ed to pcp °

## 2018-09-11 NOTE — Assessment & Plan Note (Signed)
Patient was having some left lower quadrant abdominal pain for which he presented to the emergency department.  He was found to have a elevated LFTs.  CT concerning for hepatic steatosis and nodular border of the liver clarity of early cirrhosis.  No obvious cholelithiasis with radiopaque stone.  Given his abdominal pain and elevated LFTs query choledocholithiasis versus microlithiasis, likely transient.  Clinically he is significantly improved today without jaundice or further abdominal pain.  Follow-up in 6 to 8 weeks.

## 2018-09-12 LAB — CBC WITH DIFFERENTIAL/PLATELET
Absolute Monocytes: 765 cells/uL (ref 200–950)
Basophils Absolute: 77 cells/uL (ref 0–200)
Basophils Relative: 0.9 %
Eosinophils Absolute: 181 cells/uL (ref 15–500)
Eosinophils Relative: 2.1 %
HCT: 43.7 % (ref 38.5–50.0)
Hemoglobin: 14.5 g/dL (ref 13.2–17.1)
Lymphs Abs: 2236 cells/uL (ref 850–3900)
MCH: 30.5 pg (ref 27.0–33.0)
MCHC: 33.2 g/dL (ref 32.0–36.0)
MCV: 91.8 fL (ref 80.0–100.0)
MPV: 10.8 fL (ref 7.5–12.5)
Monocytes Relative: 8.9 %
Neutro Abs: 5341 cells/uL (ref 1500–7800)
Neutrophils Relative %: 62.1 %
Platelets: 298 10*3/uL (ref 140–400)
RBC: 4.76 10*6/uL (ref 4.20–5.80)
RDW: 12.2 % (ref 11.0–15.0)
Total Lymphocyte: 26 %
WBC: 8.6 10*3/uL (ref 3.8–10.8)

## 2018-09-12 LAB — HEPATIC FUNCTION PANEL
AG Ratio: 1.6 (calc) (ref 1.0–2.5)
ALT: 67 U/L — ABNORMAL HIGH (ref 9–46)
AST: 71 U/L — ABNORMAL HIGH (ref 10–35)
Albumin: 4.3 g/dL (ref 3.6–5.1)
Alkaline phosphatase (APISO): 107 U/L (ref 35–144)
Bilirubin, Direct: 0.6 mg/dL — ABNORMAL HIGH (ref 0.0–0.2)
Globulin: 2.7 g/dL (calc) (ref 1.9–3.7)
Indirect Bilirubin: 1.5 mg/dL (calc) — ABNORMAL HIGH (ref 0.2–1.2)
Total Bilirubin: 2.1 mg/dL — ABNORMAL HIGH (ref 0.2–1.2)
Total Protein: 7 g/dL (ref 6.1–8.1)

## 2018-09-14 ENCOUNTER — Ambulatory Visit (HOSPITAL_COMMUNITY)
Admission: RE | Admit: 2018-09-14 | Discharge: 2018-09-14 | Disposition: A | Discharge status: Home / Self Care | Payer: Medicare HMO | Source: Ambulatory Visit | Attending: Nurse Practitioner | Admitting: Nurse Practitioner

## 2018-09-14 ENCOUNTER — Other Ambulatory Visit: Payer: Self-pay

## 2018-09-14 DIAGNOSIS — R7989 Other specified abnormal findings of blood chemistry: Secondary | ICD-10-CM

## 2018-09-14 DIAGNOSIS — R945 Abnormal results of liver function studies: Secondary | ICD-10-CM | POA: Insufficient documentation

## 2018-09-14 DIAGNOSIS — R1084 Generalized abdominal pain: Secondary | ICD-10-CM | POA: Diagnosis present

## 2018-09-17 ENCOUNTER — Telehealth: Payer: Self-pay | Admitting: Internal Medicine

## 2018-09-17 NOTE — Telephone Encounter (Signed)
Pt and spouse are inquiring about results.

## 2018-09-17 NOTE — Telephone Encounter (Signed)
Pt's wife called to see if patient's U/S results were back that he had done on Friday. I told her it normally takes 7-10 business days, but I would put a message in for the nurse. [phone]

## 2018-09-18 ENCOUNTER — Telehealth: Payer: Self-pay | Admitting: Internal Medicine

## 2018-09-18 NOTE — Telephone Encounter (Signed)
Pt's wife has called again today inquiring about her husband's U/S results.  I have told her several times that it could take 7-10 business days before results are available and when they are the nurse would be calling the patient.  Please call [phone]

## 2018-09-19 NOTE — Telephone Encounter (Signed)
Spoke with pt's spouse. She is aware that a message was sent to EG on 09/17/18. We will contact her when results are ready.

## 2018-09-21 NOTE — Telephone Encounter (Signed)
Called patient and wife with results. See result note.

## 2018-09-26 ENCOUNTER — Other Ambulatory Visit: Payer: Self-pay | Admitting: Family

## 2018-09-26 DIAGNOSIS — E118 Type 2 diabetes mellitus with unspecified complications: Secondary | ICD-10-CM

## 2018-10-03 ENCOUNTER — Other Ambulatory Visit: Payer: Self-pay | Admitting: Family

## 2018-10-03 DIAGNOSIS — M109 Gout, unspecified: Secondary | ICD-10-CM

## 2018-10-03 NOTE — Telephone Encounter (Signed)
Please advise on refills. Thanks.

## 2018-10-04 ENCOUNTER — Telehealth: Payer: Self-pay | Admitting: Family

## 2018-10-04 NOTE — Telephone Encounter (Signed)
Is patient to be taking allopurinol daily or as need for gout flare ups

## 2018-10-04 NOTE — Telephone Encounter (Signed)
Patient aware.

## 2018-10-04 NOTE — Telephone Encounter (Signed)
He is suppose to be taking allopurinol daily.

## 2018-10-11 ENCOUNTER — Telehealth: Payer: Self-pay | Admitting: Family

## 2018-10-11 NOTE — Chronic Care Management (AMB) (Signed)
Chronic Care Management   Note  10/11/2018 Name: Daniel Short MRN: [id number] DOB: [birth year]  Daniel Short is a 73 y.o. year old male who is a primary care patient of Sharion Balloon, FNP. I reached out to [name] by phone today in response to a referral sent by Daniel Short health plan.    Daniel Short was given information about Chronic Care Management services today including:  1. CCM service includes personalized support from designated clinical staff supervised by his physician, including individualized plan of care and coordination with other care providers 2. 24/7 contact phone numbers for assistance for urgent and routine care needs. 3. Service will only be billed when office clinical staff spend 20 minutes or more in a month to coordinate care. 4. Only one practitioner may furnish and bill the service in a calendar month. 5. The patient may stop CCM services at any time (effective at the end of the month) by phone call to the office staff. 6. The patient will be responsible for cost sharing (co-pay) of up to 20% of the service fee (after annual deductible is met).  Patient did not agree to enrollment in care management services and does not wish to consider at this time.  Follow up plan: The patient has been provided with contact information for the chronic care management team and has been advised to call with any health related questions or concerns.   [place]  [email or web address]   ??[phone]

## 2018-10-19 ENCOUNTER — Telehealth: Payer: Self-pay | Admitting: Family

## 2018-10-19 NOTE — Telephone Encounter (Signed)
Wife says he is suffering with gall bladder problems but does not want surgery.  She was concerned with all the side effects of metformin,(reading on internet ).

## 2018-10-19 NOTE — Telephone Encounter (Signed)
Is he having diarrhea? He could try to eat with large meal as this may help with stomach pains. He could try to switch to take with dinner.

## 2018-10-24 ENCOUNTER — Telehealth: Payer: Self-pay | Admitting: Internal Medicine

## 2018-10-24 DIAGNOSIS — R1084 Generalized abdominal pain: Secondary | ICD-10-CM

## 2018-10-24 DIAGNOSIS — R7989 Other specified abnormal findings of blood chemistry: Secondary | ICD-10-CM

## 2018-10-24 NOTE — Telephone Encounter (Signed)
fowarding to EG

## 2018-10-24 NOTE — Telephone Encounter (Signed)
Pt was seen by EG back in June and was told to call the office when he was ready to be set up with a surgeon to have his gallbladder removed. He has OV with [place] on 11/14/2018 at 3 with [facility]. Please advise. [phone]

## 2018-10-24 NOTE — Telephone Encounter (Signed)
Called patient and he prefers [facility]. Referral sent.

## 2018-10-24 NOTE — Addendum Note (Signed)
Addended by: Inge Rise on: 10/24/2018 03:19 PM   Modules accepted: Orders

## 2018-10-24 NOTE — Telephone Encounter (Signed)
If he's ready, we can go ahead and put in the referral to surgeon. If he's local and wants to stay in [place], can send to [facility] (Drs. Arnoldo Morale and Constance Haw). If wants [place] can refer to [facility].

## 2018-10-30 ENCOUNTER — Ambulatory Visit: Payer: Medicare HMO | Admitting: Cardiovascular Disease

## 2018-11-14 ENCOUNTER — Ambulatory Visit: Payer: Medicare HMO | Admitting: Nurse Practitioner

## 2019-01-02 ENCOUNTER — Other Ambulatory Visit: Payer: Self-pay | Admitting: Family

## 2019-01-02 DIAGNOSIS — E118 Type 2 diabetes mellitus with unspecified complications: Secondary | ICD-10-CM

## 2019-01-04 ENCOUNTER — Ambulatory Visit: Payer: Medicare HMO | Admitting: Cardiovascular Disease

## 2019-01-08 ENCOUNTER — Other Ambulatory Visit: Payer: Self-pay | Admitting: Family

## 2019-01-08 DIAGNOSIS — M109 Gout, unspecified: Secondary | ICD-10-CM

## 2019-01-08 DIAGNOSIS — E1159 Type 2 diabetes mellitus with other circulatory complications: Secondary | ICD-10-CM

## 2019-02-05 ENCOUNTER — Ambulatory Visit: Payer: Medicare HMO | Admitting: Nurse Practitioner

## 2019-04-12 ENCOUNTER — Other Ambulatory Visit: Payer: Self-pay | Admitting: Family

## 2019-04-12 DIAGNOSIS — E118 Type 2 diabetes mellitus with unspecified complications: Secondary | ICD-10-CM

## 2019-04-22 ENCOUNTER — Other Ambulatory Visit: Payer: Self-pay | Admitting: Family

## 2019-04-22 DIAGNOSIS — M109 Gout, unspecified: Secondary | ICD-10-CM

## 2019-04-22 DIAGNOSIS — E1159 Type 2 diabetes mellitus with other circulatory complications: Secondary | ICD-10-CM

## 2019-06-11 ENCOUNTER — Ambulatory Visit (INDEPENDENT_AMBULATORY_CARE_PROVIDER_SITE_OTHER): Payer: Medicare HMO | Admitting: Family

## 2019-06-11 ENCOUNTER — Other Ambulatory Visit: Payer: Self-pay

## 2019-06-11 ENCOUNTER — Encounter: Payer: Self-pay | Admitting: Family

## 2019-06-11 VITALS — BP 118/66 | HR 68 | Temp 99.1°F | Ht 65.0 in | Wt 222.4 lb

## 2019-06-11 DIAGNOSIS — E785 Hyperlipidemia, unspecified: Secondary | ICD-10-CM

## 2019-06-11 DIAGNOSIS — E1159 Type 2 diabetes mellitus with other circulatory complications: Secondary | ICD-10-CM | POA: Diagnosis not present

## 2019-06-11 DIAGNOSIS — I2510 Atherosclerotic heart disease of native coronary artery without angina pectoris: Secondary | ICD-10-CM

## 2019-06-11 DIAGNOSIS — E118 Type 2 diabetes mellitus with unspecified complications: Secondary | ICD-10-CM

## 2019-06-11 DIAGNOSIS — E1169 Type 2 diabetes mellitus with other specified complication: Secondary | ICD-10-CM

## 2019-06-11 DIAGNOSIS — I152 Hypertension secondary to endocrine disorders: Secondary | ICD-10-CM

## 2019-06-11 DIAGNOSIS — I10 Essential (primary) hypertension: Secondary | ICD-10-CM

## 2019-06-11 DIAGNOSIS — M109 Gout, unspecified: Secondary | ICD-10-CM | POA: Diagnosis not present

## 2019-06-11 DIAGNOSIS — H0012 Chalazion right lower eyelid: Secondary | ICD-10-CM

## 2019-06-11 DIAGNOSIS — Z87891 Personal history of nicotine dependence: Secondary | ICD-10-CM

## 2019-06-11 LAB — BAYER DCA HB A1C WAIVED: HB A1C (BAYER DCA - WAIVED): 6.3 % (ref <7.0)

## 2019-06-11 MED ORDER — ALLOPURINOL 100 MG PO TABS: 100.0000 mg | ORAL_TABLET | Freq: Every day | ORAL | 3 refills | Status: DC

## 2019-06-11 MED ORDER — LISINOPRIL 20 MG PO TABS: 20.0000 mg | ORAL_TABLET | Freq: Every day | ORAL | 3 refills | Status: DC

## 2019-06-11 MED ORDER — METFORMIN HCL ER 500 MG PO TB24: 500.0000 mg | ORAL_TABLET | Freq: Every day | ORAL | 3 refills | Status: DC

## 2019-06-11 NOTE — Progress Notes (Addendum)
Subjective:    Patient ID: Daniel Short, male    DOB: 1945-06-11, 74 y.o.   MRN: [id number]  Chief Complaint  Patient presents with  . Medical Management of Chronic Issues    discuss spot on eye   PT presents to the office today for chronic follow up. He is followed by Pain clinic for chronic back pain. Pt has CAD and had a MI about "10 years ago". States it has been years since he has seen a Cardiologists.   He also reports a growth on right lower lid that he noticed a couple months that has grown. He thought it was a stye, but has not went away.  Hypertension This is a chronic problem. The current episode started more than 1 year ago. The problem has been resolved since onset. The problem is controlled. Associated symptoms include malaise/fatigue and peripheral edema ("right leg some times"). Pertinent negatives include no blurred vision or shortness of breath. Risk factors for coronary artery disease include dyslipidemia, diabetes mellitus, obesity, male gender and sedentary lifestyle. The current treatment provides moderate improvement. There is no history of kidney disease or retinopathy.  Hyperlipidemia This is a chronic problem. The current episode started more than 1 year ago. The problem is controlled. Recent lipid tests were reviewed and are normal. Exacerbating diseases include obesity. Pertinent negatives include no shortness of breath. Current antihyperlipidemic treatment includes diet change. The current treatment provides no improvement of lipids. Risk factors for coronary artery disease include dyslipidemia, diabetes mellitus, male sex, hypertension, obesity and a sedentary lifestyle.  Diabetes He presents for his follow-up diabetic visit. He has type 2 diabetes mellitus. His disease course has been stable. There are no hypoglycemic associated symptoms. Pertinent negatives for diabetes include no blurred vision and no foot paresthesias. Symptoms are stable. Pertinent negatives  for diabetic complications include no retinopathy. Risk factors for coronary artery disease include dyslipidemia, diabetes mellitus, male sex, hypertension and sedentary lifestyle. He is following a generally healthy diet. (Does not check BS at home ) Eye exam is not current.  Back Pain This is a chronic problem. The current episode started more than 1 year ago. The problem occurs intermittently. The problem has been waxing and waning since onset. The pain is present in the lumbar spine. The quality of the pain is described as aching. The pain is at a severity of 7/10. The pain is moderate. Pertinent negatives include no bladder incontinence or bowel incontinence. He has tried analgesics and bed rest for the symptoms. The treatment provided moderate relief.      Review of Systems  Constitutional: Positive for malaise/fatigue.  Eyes: Negative for blurred vision.  Respiratory: Negative for shortness of breath.   Gastrointestinal: Negative for bowel incontinence.  Genitourinary: Negative for bladder incontinence.  Musculoskeletal: Positive for back pain.  All other systems reviewed and are negative.      Objective:   Physical Exam Vitals reviewed.  Constitutional:      General: He is not in acute distress.    Appearance: He is well-developed.  HENT:     Head: Normocephalic.     Right Ear: Tympanic membrane normal.     Left Ear: Tympanic membrane normal.  Eyes:     General:        Right eye: Hordeolum present. No discharge.        Left eye: No discharge.     Pupils: Pupils are equal, round, and reactive to light.      Comments: Hard  white cyst on right lower inner lid  Neck:     Thyroid: No thyromegaly.  Cardiovascular:     Rate and Rhythm: Normal rate and regular rhythm.     Heart sounds: Normal heart sounds. No murmur.  Pulmonary:     Effort: Pulmonary effort is normal. No respiratory distress.     Breath sounds: Normal breath sounds. No wheezing.  Abdominal:     General:  Bowel sounds are normal. There is no distension.     Palpations: Abdomen is soft.     Tenderness: There is no abdominal tenderness.  Musculoskeletal:        General: No tenderness. Normal range of motion.     Cervical back: Normal range of motion and neck supple.  Skin:    General: Skin is warm and dry.     Findings: No erythema or rash.  Neurological:     Mental Status: He is alert and oriented to person, place, and time.     Cranial Nerves: No cranial nerve deficit.     Deep Tendon Reflexes: Reflexes are normal and symmetric.  Psychiatric:        Behavior: Behavior normal.        Thought Content: Thought content normal.        Judgment: Judgment normal.     Diabetic Foot Exam - Simple   Simple Foot Form Diabetic Foot exam was performed with the following findings: Yes 06/11/2019  3:52 PM  Visual Inspection No deformities, no ulcerations, no other skin breakdown bilaterally: Yes Sensation Testing Intact to touch and monofilament testing bilaterally: Yes Pulse Check Posterior Tibialis and Dorsalis pulse intact bilaterally: Yes Comments      BP 118/66   Pulse 68   Temp 99.1 F (37.3 C) (Temporal)   Ht _0  (1.651 m)   Wt 222 lb 6.4 oz (100.9 kg)   SpO2 95%   BMI 37.01 kg/m      Assessment & Plan:  [name] comes in today with chief complaint of Medical Management of Chronic Issues (discuss spot on eye)   Diagnosis and orders addressed:  1. Acute gout involving toe of right foot, unspecified cause - allopurinol (ZYLOPRIM) 100 MG tablet; Take 1 tablet (100 mg total) by mouth daily. (Needs to be seen before next refill)  Dispense: 90 tablet; Refill: 3 - CMP14+EGFR - CBC with Differential/Platelet  2. Hypertension associated with diabetes ([place]) - lisinopril (ZESTRIL) 20 MG tablet; Take 1 tablet (20 mg total) by mouth daily. (Needs to be seen before next refill)  Dispense: 90 tablet; Refill: 3 - CMP14+EGFR - CBC with Differential/Platelet  3. Type 2  diabetes mellitus with complication, without long-term current use of insulin (HCC) - metFORMIN (GLUCOPHAGE-XR) 500 MG 24 hr tablet; Take 1 tablet (500 mg total) by mouth daily with breakfast. (Needs to be seen before next refill)  Dispense: 90 tablet; Refill: 3 - Bayer DCA Hb A1c Waived - CMP14+EGFR - CBC with Differential/Platelet - Microalbumin / creatinine urine ratio - Ambulatory referral to Ophthalmology  4. Coronary artery disease involving native coronary artery of native heart without angina pectoris - CMP14+EGFR - CBC with Differential/Platelet - Lipid panel  5. Type 2 diabetes mellitus with other specified complication, without long-term current use of insulin (HCC) - CMP14+EGFR - CBC with Differential/Platelet - Ambulatory referral to Ophthalmology  6. Hyperlipidemia associated with type 2 diabetes mellitus (HCC) - CMP14+EGFR - CBC with Differential/Platelet - Lipid panel  7. History of tobacco abuse - CMP14+EGFR -  CBC with Differential/Platelet  8. Chalazion of right lower eyelid - Ambulatory referral to Ophthalmology   Labs pending Health Maintenance reviewed Diet and exercise encouraged  Follow up plan: 6 months    Evelina Dun, FNP

## 2019-06-11 NOTE — Patient Instructions (Signed)
Chalazion  A chalazion is a swelling or lump on the eyelid. It can affect the upper eyelid or the lower eyelid. What are the causes? This condition may be caused by:  Long-lasting (chronic) inflammation of the eyelid glands.  A blocked oil gland in the eyelid. What are the signs or symptoms? Symptoms of this condition include:  Swelling of the eyelid. The swelling may spread to areas around the eye.  A hard lump on the eyelid.  Blurry vision. The lump on the eyelid may make it hard to see out of the eye. How is this diagnosed? This condition is diagnosed with an examination of the eye. How is this treated? This condition is treated by applying a warm compress to the eyelid. If the condition does not improve, it may be treated with:  Medicine that is injected into the chalazion by a health care provider.  Surgery.  Medicine that is applied to the eye. Follow these instructions at home: Managing pain and swelling  Apply a warm, moist compress to the eyelid 4-6 times a day for 10-15 minutes at a time. This will help to open any blocked glands and to reduce redness and swelling.  Apply over-the-counter and prescription medicines only as told by your health care provider. General instructions  Do not touch the chalazion.  Do not try to remove the pus. Do not squeeze the chalazion or stick it with a pin or needle.  Do not rub your eyes.  Wash your hands often. Dry your hands with a clean towel.  Keep your face, scalp, and eyebrows clean.  Avoid wearing eye makeup.  If the chalazion does not break open (rupture) on its own, return to your health care provider.  Keep all follow-up appointments as told by your health care provider. This is important. Contact a health care provider if:  Your eyelid has not improved in 4 weeks.  Your eyelid is getting worse.  You have a fever.  The chalazion does not rupture on its own after a month of home treatment. Get help right  away if:  You have pain in your eye.  Your vision changes.  The chalazion becomes painful or red.  The chalazion gets bigger. Summary  A chalazion is a swelling or lump on the upper or lower eyelid.  It may be caused by chronic inflammation or a blocked oil gland.  Apply a warm, moist compress to the eyelid 4-6 times a day for 10-15 minutes at a time.  Keep your face, scalp, and eyebrows clean. This information is not intended to replace advice given to you by your health care provider. Make sure you discuss any questions you have with your health care provider. Document Revised: 08/31/2017 Document Reviewed: 08/31/2017 Elsevier Patient Education  2020 Elsevier Inc.  

## 2019-06-12 LAB — CMP14+EGFR
ALT: 21 IU/L (ref 0–44)
AST: 22 IU/L (ref 0–40)
Albumin/Globulin Ratio: 1.6 (ref 1.2–2.2)
Albumin: 4.4 g/dL (ref 3.7–4.7)
Alkaline Phosphatase: 60 IU/L (ref 39–117)
BUN/Creatinine Ratio: 8 — ABNORMAL LOW (ref 10–24)
BUN: 8 mg/dL (ref 8–27)
Bilirubin Total: 1.3 mg/dL — ABNORMAL HIGH (ref 0.0–1.2)
CO2: 27 mmol/L (ref 20–29)
Calcium: 10.1 mg/dL (ref 8.6–10.2)
Chloride: 101 mmol/L (ref 96–106)
Creatinine, Ser: 1.04 mg/dL (ref 0.76–1.27)
GFR calc Af Amer: 81 mL/min/{1.73_m2} (ref >59)
GFR calc non Af Amer: 70 mL/min/{1.73_m2} (ref >59)
Globulin, Total: 2.7 g/dL (ref 1.5–4.5)
Glucose: 86 mg/dL (ref 65–99)
Potassium: 4.6 mmol/L (ref 3.5–5.2)
Sodium: 141 mmol/L (ref 134–144)
Total Protein: 7.1 g/dL (ref 6.0–8.5)

## 2019-06-12 LAB — CBC WITH DIFFERENTIAL/PLATELET
Basophils Absolute: 0.1 10*3/uL (ref 0.0–0.2)
Basos: 1 %
EOS (ABSOLUTE): 0.2 10*3/uL (ref 0.0–0.4)
Eos: 2 %
Hematocrit: 49.7 % (ref 37.5–51.0)
Hemoglobin: 16.5 g/dL (ref 13.0–17.7)
Immature Grans (Abs): 0 10*3/uL (ref 0.0–0.1)
Immature Granulocytes: 0 %
Lymphocytes Absolute: 3.5 10*3/uL — ABNORMAL HIGH (ref 0.7–3.1)
Lymphs: 34 %
MCH: 30.2 pg (ref 26.6–33.0)
MCHC: 33.2 g/dL (ref 31.5–35.7)
MCV: 91 fL (ref 79–97)
Monocytes Absolute: 1.3 10*3/uL — ABNORMAL HIGH (ref 0.1–0.9)
Monocytes: 13 %
Neutrophils Absolute: 5.1 10*3/uL (ref 1.4–7.0)
Neutrophils: 50 %
Platelets: 216 10*3/uL (ref 150–450)
RBC: 5.47 x10E6/uL (ref 4.14–5.80)
RDW: 12.1 % (ref 11.6–15.4)
WBC: 10.2 10*3/uL (ref 3.4–10.8)

## 2019-06-12 LAB — LIPID PANEL
Chol/HDL Ratio: 7.3 ratio — ABNORMAL HIGH (ref 0.0–5.0)
Cholesterol, Total: 218 mg/dL — ABNORMAL HIGH (ref 100–199)
HDL: 30 mg/dL — ABNORMAL LOW (ref >39)
LDL Chol Calc (NIH): 144 mg/dL — ABNORMAL HIGH (ref 0–99)
Triglycerides: 239 mg/dL — ABNORMAL HIGH (ref 0–149)
VLDL Cholesterol Cal: 44 mg/dL — ABNORMAL HIGH (ref 5–40)

## 2019-06-12 LAB — MICROALBUMIN / CREATININE URINE RATIO
Creatinine, Urine: 249.9 mg/dL
Microalb/Creat Ratio: 18 mg/g creat (ref 0–29)
Microalbumin, Urine: 44.1 ug/mL

## 2019-06-17 ENCOUNTER — Other Ambulatory Visit: Payer: Self-pay | Admitting: Family

## 2019-06-17 MED ORDER — ATORVASTATIN CALCIUM 20 MG PO TABS: 20.0000 mg | ORAL_TABLET | Freq: Every day | ORAL | 3 refills | Status: DC

## 2019-09-17 ENCOUNTER — Ambulatory Visit: Payer: Medicare HMO | Admitting: Physician Assistant

## 2019-11-13 ENCOUNTER — Other Ambulatory Visit: Payer: Self-pay | Admitting: Family

## 2019-11-13 MED ORDER — ONETOUCH VERIO VI STRP: ORAL_STRIP | 12 refills | Status: AC

## 2019-11-13 MED ORDER — ONETOUCH VERIO FLEX SYSTEM W/DEVICE KIT: PACK | 1 refills | Status: DC

## 2019-11-13 MED ORDER — ONETOUCH DELICA LANCETS 30G MISC: 11 refills | Status: AC

## 2019-11-13 NOTE — Telephone Encounter (Signed)
New meter and testing supplies called in for patient One touch verio flex meter, test strips & lancets

## 2019-11-13 NOTE — Telephone Encounter (Signed)
  Prescription Request  11/13/2019  What is the name of the medication or equipment? Needs glucose machine, strips and lancets. Patient hadn't been checking it. Wife said she don't know if she orders this if the patient will use it. Daniel Short knows about it  Have you contacted your pharmacy to request a refill? (if applicable) yes and was told to call the office  Which pharmacy would you like this sent to? [facility] [facility]   Patient notified that their request is being sent to the clinical staff for review and that they should receive a response within 2 business days.

## 2019-12-19 ENCOUNTER — Ambulatory Visit (INDEPENDENT_AMBULATORY_CARE_PROVIDER_SITE_OTHER): Payer: Medicare HMO | Admitting: Family

## 2019-12-19 ENCOUNTER — Other Ambulatory Visit: Payer: Self-pay

## 2019-12-19 ENCOUNTER — Encounter: Payer: Self-pay | Admitting: Family

## 2019-12-19 VITALS — BP 110/59 | HR 64 | Temp 97.8°F | Ht 65.0 in | Wt 206.0 lb

## 2019-12-19 DIAGNOSIS — E1169 Type 2 diabetes mellitus with other specified complication: Secondary | ICD-10-CM | POA: Diagnosis not present

## 2019-12-19 DIAGNOSIS — E118 Type 2 diabetes mellitus with unspecified complications: Secondary | ICD-10-CM | POA: Diagnosis not present

## 2019-12-19 DIAGNOSIS — Z87891 Personal history of nicotine dependence: Secondary | ICD-10-CM

## 2019-12-19 DIAGNOSIS — E1159 Type 2 diabetes mellitus with other circulatory complications: Secondary | ICD-10-CM

## 2019-12-19 DIAGNOSIS — I2510 Atherosclerotic heart disease of native coronary artery without angina pectoris: Secondary | ICD-10-CM | POA: Diagnosis not present

## 2019-12-19 DIAGNOSIS — I10 Essential (primary) hypertension: Secondary | ICD-10-CM

## 2019-12-19 DIAGNOSIS — E785 Hyperlipidemia, unspecified: Secondary | ICD-10-CM

## 2019-12-19 DIAGNOSIS — R17 Unspecified jaundice: Secondary | ICD-10-CM

## 2019-12-19 LAB — BAYER DCA HB A1C WAIVED: HB A1C (BAYER DCA - WAIVED): 6 % (ref <7.0)

## 2019-12-19 LAB — COAGUCHEK XS/INR WAIVED
INR: 1.2 — ABNORMAL HIGH (ref 0.9–1.1)
Prothrombin Time: 14 s

## 2019-12-19 NOTE — Progress Notes (Signed)
Subjective:    Patient ID: Daniel Short, male    DOB: 1945/09/19, 74 y.o.   MRN: [id number]  Chief Complaint  Patient presents with  . Diabetes    6 mth   . Hyperlipidemia   PT presents to the office today for chronic follow up. He is followed by Pain clinic for chronic back pain. Pt has CAD and had a MI about "10 years ago". States it has been years since he has seen a Cardiologists. Diabetes He presents for his follow-up diabetic visit. He has type 2 diabetes mellitus. His disease course has been stable. There are no hypoglycemic associated symptoms. Pertinent negatives for diabetes include no blurred vision and no foot paresthesias. Symptoms are stable. Diabetic complications include heart disease. Pertinent negatives for diabetic complications include no CVA. Risk factors for coronary artery disease include dyslipidemia, diabetes mellitus, male sex, hypertension and sedentary lifestyle. He is following a generally unhealthy diet. (Does not check BS at home) An ACE inhibitor/angiotensin II receptor blocker is being taken.  Hyperlipidemia This is a chronic problem. The current episode started more than 1 year ago. The problem is uncontrolled. Recent lipid tests were reviewed and are high. Exacerbating diseases include obesity. Pertinent negatives include no shortness of breath. Current antihyperlipidemic treatment includes statins. The current treatment provides moderate improvement of lipids. Risk factors for coronary artery disease include male sex, hypertension, a sedentary lifestyle, diabetes mellitus and dyslipidemia.  Hypertension This is a chronic problem. The current episode started more than 1 year ago. The problem has been resolved since onset. The problem is controlled. Associated symptoms include malaise/fatigue. Pertinent negatives include no blurred vision, peripheral edema or shortness of breath. Risk factors for coronary artery disease include obesity, male gender, sedentary  lifestyle and diabetes mellitus. The current treatment provides moderate improvement. There is no history of CVA.  Back Pain This is a chronic problem. The current episode started more than 1 year ago. The problem occurs intermittently. The problem has been waxing and waning since onset. The pain is present in the lumbar spine. The pain is at a severity of 0/10 ("because I just took a pain pill"). The pain is moderate. He has tried analgesics for the symptoms. The treatment provided moderate relief.      Review of Systems  Constitutional: Positive for malaise/fatigue.  Eyes: Negative for blurred vision.  Respiratory: Negative for shortness of breath.   Musculoskeletal: Positive for back pain.  All other systems reviewed and are negative.      Objective:   Physical Exam Vitals reviewed.  Constitutional:      General: He is not in acute distress.    Appearance: He is well-developed.  HENT:     Head: Normocephalic.     Right Ear: Tympanic membrane normal.     Left Ear: Tympanic membrane normal.  Eyes:     General:        Right eye: No discharge.        Left eye: No discharge.     Pupils: Pupils are equal, round, and reactive to light.     Comments: Yellow/jaudice present in bilateral eyes   Neck:     Thyroid: No thyromegaly.  Cardiovascular:     Rate and Rhythm: Normal rate and regular rhythm.     Heart sounds: Normal heart sounds. No murmur heard.   Pulmonary:     Effort: Pulmonary effort is normal. No respiratory distress.     Breath sounds: Normal breath sounds. No wheezing.  Abdominal:     General: Bowel sounds are normal. There is no distension.     Palpations: Abdomen is soft.     Tenderness: There is no abdominal tenderness.  Musculoskeletal:        General: No tenderness. Normal range of motion.     Cervical back: Normal range of motion and neck supple.  Skin:    General: Skin is warm and dry.     Coloration: Skin is jaundiced.     Findings: No erythema or  rash.  Neurological:     Mental Status: He is alert and oriented to person, place, and time.     Cranial Nerves: No cranial nerve deficit.     Deep Tendon Reflexes: Reflexes are normal and symmetric.  Psychiatric:        Behavior: Behavior normal.        Thought Content: Thought content normal.        Judgment: Judgment normal.     BP (!) 110/59   Pulse 64   Temp 97.8 F (36.6 C) (Temporal)   Ht 5' 5"  (1.651 m)   Wt 206 lb (93.4 kg)   SpO2 96%   BMI 34.28 kg/m      Assessment & Plan:  [name] comes in today with chief complaint of Diabetes (6 mth ) and Hyperlipidemia   Diagnosis and orders addressed:  1. Type 2 diabetes mellitus with complication, without long-term current use of insulin (HCC) - Bayer DCA Hb A1c Waived - BMP8+EGFR - CBC with Differential/Platelet  2. Coronary artery disease involving native coronary artery of native heart without angina pectoris - BMP8+EGFR - CBC with Differential/Platelet  3. Hypertension associated with diabetes ([place]) - BMP8+EGFR - CBC with Differential/Platelet  4. Hyperlipidemia associated with type 2 diabetes mellitus (HCC) - BMP8+EGFR - CBC with Differential/Platelet - Lipid panel  5. Type 2 diabetes mellitus with other specified complication, without long-term current use of insulin (HCC) - BMP8+EGFR - CBC with Differential/Platelet  6. History of tobacco abuse - BMP8+EGFR - CBC with Differential/Platelet  7. Morbid obesity (HCC) - BMP8+EGFR - CBC with Differential/Platelet  8. Jaundice Worrisome with his yellow of his pupils and skin. Will do lab work and stat GI. Will do stat CT of abdomen.  - BMP8+EGFR - CBC with Differential/Platelet - Hepatic function panel - Hepatitis C antibody - Hepatitis B surface antibody,qualitative - Lipid panel - Urinalysis - CoaguChek XS/INR Waived - Ambulatory referral to Gastroenterology   Labs pending Health Maintenance reviewed Diet and exercise  encouraged  Follow up plan: 3 months    Evelina Dun, FNP

## 2019-12-19 NOTE — Patient Instructions (Signed)
Jaundice, Adult ° °Jaundice is a yellowish discoloration of the skin, the whites of the eyes, and the mucous membranes. Jaundice can be a sign that the liver or the bile system is not working properly. °What are the causes? °This condition is caused by an increase in the level of bilirubin in the blood. Bilirubin is a substance that is produced by the normal breakdown of red blood cells. Conditions and activities that can cause an increase in the bilirubin level include: °· Gallstones, if they block the bile ducts. °· Alcoholic liver disease. °· Other liver diseases, such as cirrhosis. Cirrhosis is scarring of the liver. °· Certain cancers that may block the bile ducts, such as pancreatic cancer. °· Certain infections, such as viral hepatitis. °· Certain genetic syndromes, such as Gilbert syndrome. °· Certain medicines, such as acetaminophen if used in excess. °What are the signs or symptoms? °Symptoms of this condition include: °· Yellow color to the skin, the whites of the eyes, or the mucous membranes. °· Dark brown urine. °· Stomach pain. °· Light or clay-colored stool. °· Itchy skin (pruritus). °How is this diagnosed? °This condition is diagnosed with medical history, physical exam, blood tests, and imaging tests. You may have additional tests to check for other causes of increased bilirubin levels in your blood or to rule out other possible causes. °How is this treated? °Treatment for this condition depends on the cause of your jaundice and other underlying conditions. Treatment may include: °· Stopping the use of certain medicines. °· Giving fluids through an IV that is inserted into one of your veins. °· Using medicines to treat pruritus. °· Doing surgery, if there is blockage of the bile ducts. °Follow these instructions at home: ° °· Take over-the-counter and prescription medicines only as told by your health care provider. °· Use skin lotion to relieve itching. °· Drink plenty of fluid. °· Do not drink  alcohol. °· Keep all follow-up visits as told by your health care provider. This is important. °Contact a health care provider if you have: °· A fever. °· Abdominal pain or swelling. °Get help right away if: °· Your symptoms suddenly get worse. °· Your pain gets worse. °· You vomit repeatedly. °· You vomit blood. °· You become weak or confused. °· You develop a severe headache. °· You have blood in your stool. °· You become severely dehydrated. Signs of severe dehydration include: °? A very dry mouth. °? A rapid, weak pulse. °? Rapid breathing. °? Blue lips. °Summary °· Jaundice is a yellowish discoloration of the skin, the whites of the eyes, and the mucous membranes. Jaundice can be a sign that the liver or the bile system is not working properly. °· Symptoms include a yellow color to the skin or eyes, dark urine, and a change in stool color. °· Your health care provider will need to do further testing, including blood tests and imaging studies. °· Get help right away if your symptoms suddenly get worse, you become weak and confused, you vomit, you have blood in your stool, you have a severe headache, or you become dehydrated. °This information is not intended to replace advice given to you by your health care provider. Make sure you discuss any questions you have with your health care provider. °Document Revised: 03/17/2017 Document Reviewed: 03/17/2017 °Elsevier Patient Education © 2020 Elsevier Inc. ° °

## 2019-12-20 ENCOUNTER — Other Ambulatory Visit: Payer: Self-pay | Admitting: Family

## 2019-12-20 DIAGNOSIS — R748 Abnormal levels of other serum enzymes: Secondary | ICD-10-CM

## 2019-12-20 DIAGNOSIS — R17 Unspecified jaundice: Secondary | ICD-10-CM

## 2019-12-20 LAB — CBC WITH DIFFERENTIAL/PLATELET
Basophils Absolute: 0.1 10*3/uL (ref 0.0–0.2)
Basos: 1 %
EOS (ABSOLUTE): 0.1 10*3/uL (ref 0.0–0.4)
Eos: 2 %
Hematocrit: 46.8 % (ref 37.5–51.0)
Hemoglobin: 15.9 g/dL (ref 13.0–17.7)
Immature Grans (Abs): 0 10*3/uL (ref 0.0–0.1)
Immature Granulocytes: 0 %
Lymphocytes Absolute: 2 10*3/uL (ref 0.7–3.1)
Lymphs: 32 %
MCH: 30.5 pg (ref 26.6–33.0)
MCHC: 34 g/dL (ref 31.5–35.7)
MCV: 90 fL (ref 79–97)
Monocytes Absolute: 0.6 10*3/uL (ref 0.1–0.9)
Monocytes: 9 %
Neutrophils Absolute: 3.5 10*3/uL (ref 1.4–7.0)
Neutrophils: 56 %
Platelets: 206 10*3/uL (ref 150–450)
RBC: 5.21 x10E6/uL (ref 4.14–5.80)
RDW: 12 % (ref 11.6–15.4)
WBC: 6.3 10*3/uL (ref 3.4–10.8)

## 2019-12-20 LAB — BMP8+EGFR
BUN/Creatinine Ratio: 7 — ABNORMAL LOW (ref 10–24)
BUN: 8 mg/dL (ref 8–27)
CO2: 24 mmol/L (ref 20–29)
Calcium: 9.9 mg/dL (ref 8.6–10.2)
Chloride: 100 mmol/L (ref 96–106)
Creatinine, Ser: 1.2 mg/dL (ref 0.76–1.27)
GFR calc Af Amer: 68 mL/min/{1.73_m2} (ref >59)
GFR calc non Af Amer: 59 mL/min/{1.73_m2} — ABNORMAL LOW (ref >59)
Glucose: 133 mg/dL — ABNORMAL HIGH (ref 65–99)
Potassium: 4.1 mmol/L (ref 3.5–5.2)
Sodium: 142 mmol/L (ref 134–144)

## 2019-12-20 LAB — HEPATIC FUNCTION PANEL
ALT: 86 IU/L — ABNORMAL HIGH (ref 0–44)
AST: 86 IU/L — ABNORMAL HIGH (ref 0–40)
Albumin: 4.2 g/dL (ref 3.7–4.7)
Alkaline Phosphatase: 190 IU/L — ABNORMAL HIGH (ref 44–121)
Bilirubin Total: 6 mg/dL — ABNORMAL HIGH (ref 0.0–1.2)
Bilirubin, Direct: 2.27 mg/dL — ABNORMAL HIGH (ref 0.00–0.40)
Total Protein: 6.9 g/dL (ref 6.0–8.5)

## 2019-12-20 LAB — LIPID PANEL
Chol/HDL Ratio: 3.2 ratio (ref 0.0–5.0)
Cholesterol, Total: 101 mg/dL (ref 100–199)
HDL: 32 mg/dL — ABNORMAL LOW (ref >39)
LDL Chol Calc (NIH): 49 mg/dL (ref 0–99)
Triglycerides: 105 mg/dL (ref 0–149)
VLDL Cholesterol Cal: 20 mg/dL (ref 5–40)

## 2019-12-20 LAB — HEPATITIS B SURFACE ANTIBODY,QUALITATIVE: Hep B Surface Ab, Qual: NONREACTIVE

## 2019-12-20 LAB — HEPATITIS C ANTIBODY: Hep C Virus Ab: 0.1 s/co ratio (ref 0.0–0.9)

## 2019-12-23 ENCOUNTER — Ambulatory Visit (HOSPITAL_COMMUNITY): Payer: Medicare HMO

## 2019-12-23 ENCOUNTER — Other Ambulatory Visit: Payer: Self-pay | Admitting: Family

## 2019-12-23 ENCOUNTER — Telehealth: Payer: Self-pay | Admitting: Family

## 2019-12-23 NOTE — Progress Notes (Signed)
Daniel Short, [name], FNP Good Morning - I called this Patient to verify he could go to [facility] today (12/23/2019) for his STAT CT, Patient was made aware he would have to sit at [facility] and drink contrast for at least 2 hours, Patient still agreed he could go to [facility] today. I called Centralized Scheduling, they placed him on the CT schedule for [facility] at 9:30 but stated he needed to come ASAP. I called Patient back and Patient and Wife were both on the phone, Patient's Wife was very aggressive with me stating " I don't understand why he needs this exam", I explained to her his diagnoses of Jaundice, elevated live enzymes, and elevated bilirubin - She then stated "well the doctor did not even say, what it could be" and then I stated to her that is why the CT is being ordered. Patient then became very aggravated and stated " He did not want to be pushed into anything" - I stated when an exam is ordered STAT they would like for it to be done within 24-48 hrs, The wife then stated "Well it was put in Friday and nobody called [place] Friday", I then stated to her that I had to wait for Authorization from Patient's insurance and if I did not receive authorization that Patient could be made responsible for entire payment of CT, Wife made no response. I explained to Patient that if he did not want done today we could schedule it for another day but he would have to go to [facility] to pick up the contrast since we did not have any currently in the office, Wife then stated "Well I don't think he is going to be able to go down there and sit, and she knew what was wrong with him and that he just needed to be seen by Gertie Fey". I made her aware that he does have a Gastro Ref. Patient still very aggravated stated " He just wanted to be left alone & thanked me for calling"   I canceled the CT due to Patient Refusal.

## 2019-12-25 ENCOUNTER — Other Ambulatory Visit: Payer: Self-pay

## 2019-12-25 ENCOUNTER — Telehealth: Payer: Self-pay | Admitting: *Deleted

## 2019-12-25 ENCOUNTER — Ambulatory Visit (HOSPITAL_COMMUNITY)
Admission: RE | Admit: 2019-12-25 | Discharge: 2019-12-25 | Disposition: A | Discharge status: Home / Self Care | Payer: Medicare HMO | Source: Ambulatory Visit | Attending: Family | Admitting: Family

## 2019-12-25 DIAGNOSIS — R17 Unspecified jaundice: Secondary | ICD-10-CM

## 2019-12-25 DIAGNOSIS — R748 Abnormal levels of other serum enzymes: Secondary | ICD-10-CM | POA: Insufficient documentation

## 2019-12-25 MED ORDER — IOHEXOL 9 MG/ML PO SOLN
ORAL | Status: AC
  Filled 2019-12-25: qty 1000

## 2019-12-25 MED ORDER — IOHEXOL 300 MG/ML  SOLN
100.0000 mL | Freq: Once | INTRAMUSCULAR | Status: AC | PRN
  Administered 2019-12-25: 100 mL via INTRAVENOUS

## 2019-12-25 NOTE — Telephone Encounter (Signed)
12/25/2019  Incoming call from patient's wife, [name], regarding recent lab results that showed multiple elevated liver functions. Results reviewed in detail. Also discussed results of CT and abd ultrasound from 08/2018 which showed fatty liver, gallstones, and possible cirrhosis. Gallbladder removal was recommended at that time but patient declined. Advised of need for STAT CT per provider's notes. Patient was contacted with an appointment early one morning but was unable to get up and make it to [facility] in time. He's also hesitant to drink the contrast and is worried about the possibility of surgery. Per wife, patient was yellow at time of visit but that has improved since stopping his cholesterol medicine on his own. Per wife, his memory doesn't seem as good recently. Discussed that hepatic encephalopathy is a potential complication of liver disease and can cause problems with cognition.   Stressed the importance of having the CT scan and discussed possible complications of gallstones and/or liver disease. Patient and wife would like further discussion with PCP office. Patient should seek emergency medical attention if needed. I spoke with Nurse Team Lead, Georgina Pillion, LPN who contacted the patient and his wife. A CT will be ordered for today or tomorrow and patient was agreeable.   Forwarding to Evelina Dun, FNP as an Juluis Rainier.   Chong Sicilian, BSN, RN-BC Embedded Chronic Care Manager [facility] [place] [facility] Direct Dial: [phone]

## 2019-12-30 NOTE — Progress Notes (Signed)
Patient aware.

## 2019-12-31 ENCOUNTER — Ambulatory Visit: Payer: Medicare HMO | Admitting: *Deleted

## 2019-12-31 DIAGNOSIS — R748 Abnormal levels of other serum enzymes: Secondary | ICD-10-CM

## 2019-12-31 DIAGNOSIS — R17 Unspecified jaundice: Secondary | ICD-10-CM

## 2019-12-31 NOTE — Patient Instructions (Signed)
  Follow up plan: Follow-up with GI tomorrow at 10:30 F/u with PCP as planned Seek emergency medical attention as needed Reach out to [place] as needed  Chong Sicilian, BSN, RN-BC [place] / [place] [facility] Direct Dial: [phone]

## 2019-12-31 NOTE — Progress Notes (Signed)
Referring Provider: Sharion Balloon, FNP Primary Care Physician:  Sharion Balloon, FNP Primary GI Physician: Dr. Gala Romney  Chief Complaint  Patient presents with   Weight Loss    14-15lbs in 2-3 months   Jaundice    x august. CT recently done   Hematuria    2 days   Abdominal Pain    across upper abd, radiates into lower back, also had pain radiate up in chest on Sunday night. Also has chronic back pain per spouse    HPI:   Daniel Short is a 74 y.o. male presenting today with acute concern for jaundice.  Patient saw PCP 12/19/2019 and per office note, patient had yellowing of his eyes and skin.  Plan for lab work and stat CT.  Labs 9/23: AST 86, ALT 86, alk phos 190, total bilirubin 6.0.  CBC within normal limits, kidney function and electrolytes within normal limits.  Hep C antibody negative.  Hepatitis B surface antibody nonreactive.  CT A/P with contrast 12/25/2019: No focal liver lesions. Gallbladder collapsed around 8 mm gallstone, no gallbladder inflammation, no intrahepatic ductal dilation, CBD upper limits of normal at 6 mm. Pancreas appears normal.   Patient was initially seen in our office in June 2020 for abdominal pain and elevated LFTs.  He had previously been evaluated in the emergency room.  AST/ALT at 56/28, alk phos 135 total bilirubin 9.7.  CT at that time with hepatic steatosis and nodular border of the liver with possible early cirrhosis.  Given abdominal pain and elevated LFTs, suspected possible transient choledocholithiasis/microlithiasis.  He was clinically improved without jaundice or further abdominal pain.  Plan for RUQ ultrasound with elastography and repeat labs.    Repeat labs at that time with AST 71/ALT 67, total bilirubin down to 2.1.  Ultrasound elastography with hepatic steatosis and F2 with some F3.  It was recommended he see a surgeon for consideration of cholecystectomy as he may very well have had choledocholithiasis.  Referral was sent to  [facility] surgery.  Per referral note, states patient declined.  Patient canceled his follow-up appointments with our office.  Today:  Presents with his wife.  Feels fatigued. Intermittent very mild abdominal pain maybe once a week. Thinks it may be triggered by what he is eating but can't tell me what. Sunday he had severe upper abdominal pain (across entire upper abdomen) that radiated up into his chest.  This is the first time he has had such severe abdominal pain since his last episode in June 2020.  Took pepto and tums which seemed to help a little but symptoms persisted for several hours.  Associated nausea. No vomiting. No abdominal pain today.  Has a mild headache today.  Jaundice is much improved.  Denies fever or mental status changes.  Doesn't eat greasy foods. Had [place] fries on Sunday.    Very rare GERD symptoms.   17 lb weight loss since March. Has cut back on his eating. States, "I was getting too fat and needed to lose weight."  Also has a lot of dental issues, and his teeth limits his intake. Abdominal pain doesn't limit eating. No nausea routinely. No early satiety.   Urine is "red".  Has been orange as well. This morning his urine is starting to clear up. When he has episodes of abdominal pain his urine gets more dark.    BMs daily. No blood in the stools. No black stools typically. Had black stools when he took  peoto bismol.   No prior TCS or EGD.   No tylenol. No NSAIDs.   Past Medical History:  Diagnosis Date   Arthritis    CAD (coronary artery disease)    with stent placement   Complication of anesthesia    wife states he woke up during heart stent,; with stroke he tried to put out IV'S   Coronary artery arteriosclerosis    had stenting in past; not sure when   CVA (cerebrovascular accident due to intracerebral hemorrhage) ([place])    Dyspnea    when walking short distances   Ear infection 1979   right ear due to wax build up , stayed in hospital  several days   Headache    Heart attack [facility])    developed 3rd degree AVB, inferior ST elevation following 02/15/02 LHC showing 70% RCA--> s/p temporary PPM and proximal/ostial RCA Zeta stents    Stroke ([place]) 2000   cva due to intracerebral hemorrhage    Past Surgical History:  Procedure Laterality Date   CARPAL TUNNEL RELEASE     CORONARY STENT PLACEMENT  2005   HERNIA REPAIR     KNEE ARTHROSCOPY Right    LUMBAR LAMINECTOMY/DECOMPRESSION MICRODISCECTOMY N/A 09/14/2016   Procedure: Decompression L3-5, insitu fusion L4-5;  Surgeon: Melina Schools, MD;  Location: [place];  Service: Orthopedics;  Laterality: N/A;  3 hrs   LUMBAR LAMINECTOMY/DECOMPRESSION MICRODISCECTOMY N/A 09/17/2016   Procedure: WOUND EXPLORATION, GILL DECOMPRESSION,FAR LATERAL DISCECTOMY LEFT L4-L5, AND EVACUATION OF HEMATOMA;  Surgeon: Melina Schools, MD;  Location: [place];  Service: Orthopedics;  Laterality: N/A;    Current Outpatient Medications  Medication Sig Dispense Refill   allopurinol (ZYLOPRIM) 100 MG tablet Take 1 tablet (100 mg total) by mouth daily. (Needs to be seen before next refill) 90 tablet 3   atorvastatin (LIPITOR) 20 MG tablet Take 1 tablet (20 mg total) by mouth daily. 90 tablet 3   blood glucose meter kit and supplies Dispense based on patient and insurance preference. Use up to four times daily as directed. (FOR ICD-10 E10.9, E11.9). 1 each 0   Blood Glucose Monitoring Suppl ([place]) w/Device KIT Use to test blood sugar daily as directed. DX: E11.65 1 kit 1   glucose blood (ONETOUCH VERIO) test strip Use to test blood sugar daily as directed. DX: E11.65 100 each 12   HYDROcodone-acetaminophen (NORCO/VICODIN) 5-325 MG tablet      lisinopril (ZESTRIL) 20 MG tablet Take 1 tablet (20 mg total) by mouth daily. (Needs to be seen before next refill) 90 tablet 3   metFORMIN (GLUCOPHAGE-XR) 500 MG 24 hr tablet Take 1 tablet (500 mg total) by mouth daily with breakfast.  (Needs to be seen before next refill) 90 tablet 3   OneTouch Delica Lancets 23F MISC Use to test blood sugar daily as directed. DX: E11.65 100 each 11   No current facility-administered medications for this visit.    Allergies as of 01/01/2020 - Review Complete 01/01/2020  Allergen Reaction Noted   No known allergies  09/13/2016    Family History  Problem Relation Age of Onset   Heart attack Mother    Heart attack Father    COPD Father    Heart attack Brother    Colon cancer Neg Hx     Social History   Socioeconomic History   Marital status: Married    Spouse name: Not on file   Number of children: Not on file   Years of education: Not on  file   Highest education level: Not on file  Occupational History   Not on file  Tobacco Use   Smoking status: Former Smoker    Packs/day: 0.25    Years: 54.00    Pack years: 13.50    Types: Cigarettes    Quit date: 08/27/2015    Years since quitting: 4.3   Smokeless tobacco: Never Used  Vaping Use   Vaping Use: Never used  Substance and Sexual Activity   Alcohol use: No   Drug use: No   Sexual activity: Not on file  Other Topics Concern   Not on file  Social History Narrative   Not on file   Social Determinants of Health   Financial Resource Strain:    Difficulty of Paying Living Expenses: Not on file  Food Insecurity:    Worried About Charity fundraiser in the Last Year: Not on file   [employer] of Food in the Last Year: Not on file  Transportation Needs:    Lack of Transportation (Medical): Not on file   Lack of Transportation (Non-Medical): Not on file  Physical Activity:    Days of Exercise per Week: Not on file   Minutes of Exercise per Session: Not on file  Stress:    Feeling of Stress : Not on file  Social Connections:    Frequency of Communication with Friends and Family: Not on file   Frequency of Social Gatherings with Friends and Family: Not on file   Attends Religious  Services: Not on file   Active Member of Clubs or Organizations: Not on file   Attends Archivist Meetings: Not on file   Marital Status: Not on file    Review of Systems: Gen: Denies fever, chills, lightheadedness, dizziness, presyncope, syncope. CV: Denies chest pain or palpitations. Resp: Admits to dyspnea with little exertion. No cough.  GI: See HPI Heme: See HPI  Physical Exam: BP (!) 148/72    Pulse (!) 59    Temp (!) 97.1 F (36.2 C) (Oral)    Ht _0  (1.702 m)    Wt 205 lb 9.6 oz (93.3 kg)    BMI 32.20 kg/m  General:   Alert and oriented. No distress noted. Pleasant and cooperative.  Walking with a cane. Head:  Normocephalic and atraumatic. Eyes: Very minimal scleral icterus at the edges. Heart:  S1, S2 present without murmurs appreciated. Lungs:  Clear to auscultation bilaterally. No wheezes, rales, or rhonchi. No distress.  Abdomen:  +BS, soft, and non-distended.  Moderate tenderness to palpation across the entire upper abdomen, worse in the epigastric and RUQ.  No rebound or guarding. No HSM or masses noted. Msk:  Symmetrical without gross deformities. Normal posture. Extremities:  Without edema. Neurologic:  Alert and  oriented x4 Psych:  Normal mood and affect.

## 2019-12-31 NOTE — Chronic Care Management (AMB) (Addendum)
  Care Management   Note  12/31/2019 Name: Daniel Short MRN: [id number] DOB: 12-27-1945  Incoming call from patient's wife, [name], with patient present at time of call. [name] is enrolled in the CCM program, but [place] is not. He had a recent CT scan due to elevated LFTs and jaundice. Positive for 72mm gallstone but no gallbladder inflammation. Gallbladder removal was recommended last year but patient declined. No hepatic lesions or obvious duct blockage. Patient complains of back pain and right upper quadrant pain. Has had some nausea but no vomiting. Reports that urine is really dark orange today. He is drinking plenty of water. Discussed appt with [facility] for 02/10/20 and need for sooner appointment.  I reached out to [facility] at [phone] and was able to move his appointment up to tomorrow at 10:30 with Aliene Altes. Patient and wife notified of appointment and provided with their address and telephone number. Encouraged them to seek emergency medical attention if needed.    Follow up plan: Follow-up with GI tomorrow at 10:30 F/u with PCP as planned Seek emergency medical attention as needed Reach out to [place] as needed  Chong Sicilian, BSN, RN-BC [place] / [place]: [phone]    I have reviewed the CCM documentation and agree with the written assessment and plan of care.  Evelina Dun, FNP

## 2020-01-01 ENCOUNTER — Ambulatory Visit: Payer: Medicare HMO | Admitting: Gastroenterology

## 2020-01-01 ENCOUNTER — Encounter: Payer: Self-pay | Admitting: Gastroenterology

## 2020-01-01 ENCOUNTER — Other Ambulatory Visit: Payer: Self-pay

## 2020-01-01 ENCOUNTER — Telehealth: Payer: Self-pay | Admitting: *Deleted

## 2020-01-01 VITALS — BP 148/72 | HR 59 | Temp 97.1°F | Ht 67.0 in | Wt 205.6 lb

## 2020-01-01 DIAGNOSIS — R634 Abnormal weight loss: Secondary | ICD-10-CM

## 2020-01-01 DIAGNOSIS — R319 Hematuria, unspecified: Secondary | ICD-10-CM | POA: Diagnosis not present

## 2020-01-01 DIAGNOSIS — R1010 Upper abdominal pain, unspecified: Secondary | ICD-10-CM

## 2020-01-01 DIAGNOSIS — R7989 Other specified abnormal findings of blood chemistry: Secondary | ICD-10-CM

## 2020-01-01 NOTE — Progress Notes (Signed)
Cc'ed to pcp °

## 2020-01-01 NOTE — Telephone Encounter (Signed)
PA pending review via humana. Clinicals faxed

## 2020-01-01 NOTE — Assessment & Plan Note (Addendum)
74 year old male with intermittent mild upper abdominal pain that seems to be triggered by fried/fatty foods.  On Sunday (3 days ago), patient had severe upper abdominal pain with associated nausea that lasted for several hours. Noticed his urine turns dark or red when he has these attacks.  Previously had lab work with PCP 12/19/2019 with AST 86, ALT 86, alk phos 190, total bilirubin 6.0.  CT A/P with contrast 9/29 with no focal liver lesions, gallbladder collapsed around 8 mm gallstone, no gallbladder inflammation or intrahepatic ductal dilation, CBD upper limits of normal at 6 mm, pancreas appeared normal.  He is clinically much improved compared to Sunday and had little to no jaundice; however, on exam, he still has moderate tenderness palpation across the entire upper abdomen, worse in the epigastric and RUQ region.  Notably, patient had a similar episode back in June 2020 with abdominal pain and elevated LFTs which was suspected to be secondary to transient choledocholithiasis/microlithiasis.  He had been referred to [facility] surgery but declined cholecystectomy.  Notably, LFTs were within normal limits in March 2021 other than total bilirubin 1.3.   Considering clinical picture, I suspect patient symptoms are again likely secondary to transient choledocholithiasis and/or microlithiasis.  I cannot rule out a residual CBD stone as he continues to have abdominal pain.  No signs or symptoms to suggest cholangitis.  Plan: MRI/MRCP STAT. CBC, HFP, BMP, Lipase Advised to follow a bland diet for now. Advised to proceed to the emergency room if he were to have any return of significant abdominal pain, nausea, vomiting, fever, or mental status changes. Further recommendations to follow.

## 2020-01-01 NOTE — Assessment & Plan Note (Addendum)
Elevated LFTs and bilirubin in the setting of recurrent intermittent upper abdominal pain with severe abdominal pain occurring Sunday (3 days ago) after eating [place] fries.  Associated nausea without vomiting.  Patient reports significant clinical improvement since Sunday.  Jaundice is improving.  On exam, he continues with moderate tenderness to palpation across the entire upper abdomen.  Of note, patient had similar symptoms in June 2020 with abdominal pain and elevated LFTs which was suspected to be secondary to transient choledocholithiasis/microlithiasis.  He was referred to [facility] surgery but ultimately declined cholecystectomy. He also had elastography in June 2020 revealing hepatic steatosis and F2 with some F3. LFTs had returned to normal aside from minimal elevation of bilirubin at 1.3 in March 2021.  Considering his clinical picture, I suspect elevated LFTs are likely secondary to choledocholithiasis/microlithiasis.  Cannot rule out residual CBD stone considering ongoing pain on exam. No symptoms to suggest cholangitis.   Plan:  MRI/MRCP STAT. CBC, HFP, BMP, Lipase Advised to follow a bland diet for now. Advised to proceed to the emergency room if he were to have any return of significant abdominal pain, nausea, vomiting, fever, or mental status changes. Further recommendations to follow.

## 2020-01-01 NOTE — Assessment & Plan Note (Addendum)
Patient reports intermittent episodes of orange or red urine.  Reports these episodes seem to follow intermittent upper abdominal pain which I suspect is biliary in etiology with possible choledocholithiasis/microlithiasis as per above.  Recent labs 9/23 with total bilirubin 6.0. LFTs also elevated. Patient reports his urine is starting to clear up today and his jaundice is significantly improved.   It is possible urine discoloration is secondary to bilirubin excretion.  However, patient/patient's wife is requesting urine analysis today.  We will obtain UA and urine culture.  Additional work-up for elevated LFTs and abdominal pain as per above.

## 2020-01-01 NOTE — Telephone Encounter (Signed)
Called pt and made aware MRCP scheduled for 01/03/2020 Friday at 12:00pm, arrival 11:30am, npo 4 hrs prior. He is also aware labs needs to be completed.

## 2020-01-01 NOTE — Patient Instructions (Addendum)
Please have labs and MRI completed at [facility].   Follow a bland diet for now. Avoid fried/fatty/greasy foods.   If you have return of significant abdominal pain, nausea with vomiting, fever, or confusion you should proceed to the emergency room.   We will call you with results and further recommendations.   Aliene Altes, PA-C [facility]

## 2020-01-01 NOTE — Telephone Encounter (Signed)
PA approved via humana. Auth# [id number] Dates 01/01/2020-04/02/2019

## 2020-01-01 NOTE — Assessment & Plan Note (Addendum)
Patient has had 17 pound weight loss since March.  After further discussion, patient reports he has been trying to lose weight by limiting his intake.  States, "I was getting too fat."  He also reports he has a lot of dental issues which limits his intake as well.  Denies intake being limited by abdominal pain, nausea, vomiting, or early satiety.  He has never had a colonoscopy or upper endoscopy.  He is currently dealing with recurrent upper abdominal pain which I suspect is biliary in etiology as discussed above.  CT A/P with contrast 12/25/2019 with gallbladder collapsed around 8 mm gallstone, no gallbladder inflammation, no intrahepatic ductal dilation, CBD upper limits of normal at 6 mm, otherwise, no concerning findings.  Suspect weight loss is likely secondary to decreased oral intake. We are focusing on evaluation of upper abdominal pain and elevated LFTs today as per above.  Can discuss endoscopic evaluation in the future if needed.  Would recommend he at least have a colonoscopy.

## 2020-01-02 ENCOUNTER — Other Ambulatory Visit: Payer: Self-pay

## 2020-01-02 ENCOUNTER — Telehealth: Payer: Self-pay | Admitting: Internal Medicine

## 2020-01-02 ENCOUNTER — Other Ambulatory Visit: Payer: Medicare HMO

## 2020-01-02 DIAGNOSIS — Z79899 Other long term (current) drug therapy: Secondary | ICD-10-CM

## 2020-01-02 DIAGNOSIS — R1010 Upper abdominal pain, unspecified: Secondary | ICD-10-CM

## 2020-01-02 NOTE — Telephone Encounter (Signed)
[facility] called to say that patient was there and we needed to change his lab order from [place] to [facility].

## 2020-01-02 NOTE — Telephone Encounter (Signed)
Lab orders have been changed and faxed.

## 2020-01-03 ENCOUNTER — Telehealth: Payer: Self-pay | Admitting: Nurse Practitioner

## 2020-01-03 ENCOUNTER — Other Ambulatory Visit: Payer: Self-pay | Admitting: Nurse Practitioner

## 2020-01-03 ENCOUNTER — Other Ambulatory Visit: Payer: Self-pay | Admitting: Gastroenterology

## 2020-01-03 ENCOUNTER — Ambulatory Visit (HOSPITAL_COMMUNITY)
Admission: RE | Admit: 2020-01-03 | Discharge: 2020-01-03 | Disposition: A | Discharge status: Home / Self Care | Payer: Medicare HMO | Source: Ambulatory Visit | Attending: Gastroenterology | Admitting: Gastroenterology

## 2020-01-03 ENCOUNTER — Telehealth: Payer: Self-pay

## 2020-01-03 ENCOUNTER — Other Ambulatory Visit: Payer: Self-pay | Admitting: General Practice

## 2020-01-03 DIAGNOSIS — R1010 Upper abdominal pain, unspecified: Secondary | ICD-10-CM

## 2020-01-03 DIAGNOSIS — R7989 Other specified abnormal findings of blood chemistry: Secondary | ICD-10-CM

## 2020-01-03 DIAGNOSIS — K8050 Calculus of bile duct without cholangitis or cholecystitis without obstruction: Secondary | ICD-10-CM

## 2020-01-03 DIAGNOSIS — K8051 Calculus of bile duct without cholangitis or cholecystitis with obstruction: Secondary | ICD-10-CM

## 2020-01-03 LAB — CBC WITH DIFFERENTIAL/PLATELET
Basophils Absolute: 0.1 10*3/uL (ref 0.0–0.2)
Basos: 1 %
EOS (ABSOLUTE): 0.1 10*3/uL (ref 0.0–0.4)
Eos: 2 %
Hematocrit: 45.9 % (ref 37.5–51.0)
Hemoglobin: 15.6 g/dL (ref 13.0–17.7)
Immature Grans (Abs): 0 10*3/uL (ref 0.0–0.1)
Immature Granulocytes: 0 %
Lymphocytes Absolute: 2.9 10*3/uL (ref 0.7–3.1)
Lymphs: 39 %
MCH: 30.1 pg (ref 26.6–33.0)
MCHC: 34 g/dL (ref 31.5–35.7)
MCV: 88 fL (ref 79–97)
Monocytes Absolute: 0.5 10*3/uL (ref 0.1–0.9)
Monocytes: 7 %
Neutrophils Absolute: 3.8 10*3/uL (ref 1.4–7.0)
Neutrophils: 51 %
Platelets: 229 10*3/uL (ref 150–450)
RBC: 5.19 x10E6/uL (ref 4.14–5.80)
RDW: 12.2 % (ref 11.6–15.4)
WBC: 7.5 10*3/uL (ref 3.4–10.8)

## 2020-01-03 LAB — HEPATIC FUNCTION PANEL
ALT: 51 IU/L — ABNORMAL HIGH (ref 0–44)
AST: 27 IU/L (ref 0–40)
Albumin: 4 g/dL (ref 3.7–4.7)
Alkaline Phosphatase: 156 IU/L — ABNORMAL HIGH (ref 44–121)
Bilirubin Total: 1.7 mg/dL — ABNORMAL HIGH (ref 0.0–1.2)
Bilirubin, Direct: 0.62 mg/dL — ABNORMAL HIGH (ref 0.00–0.40)
Total Protein: 6.7 g/dL (ref 6.0–8.5)

## 2020-01-03 LAB — LIPASE: Lipase: 70 U/L (ref 13–78)

## 2020-01-03 LAB — URINALYSIS
Bilirubin, UA: NEGATIVE
Glucose, UA: NEGATIVE
Leukocytes,UA: NEGATIVE
Nitrite, UA: NEGATIVE
RBC, UA: NEGATIVE
Specific Gravity, UA: 1.022 (ref 1.005–1.030)
Urobilinogen, Ur: 1 mg/dL (ref 0.2–1.0)
pH, UA: 5.5 (ref 5.0–7.5)

## 2020-01-03 LAB — BASIC METABOLIC PANEL
BUN/Creatinine Ratio: 8 — ABNORMAL LOW (ref 10–24)
BUN: 8 mg/dL (ref 8–27)
CO2: 21 mmol/L (ref 20–29)
Calcium: 9.2 mg/dL (ref 8.6–10.2)
Chloride: 102 mmol/L (ref 96–106)
Creatinine, Ser: 0.98 mg/dL (ref 0.76–1.27)
GFR calc Af Amer: 87 mL/min/{1.73_m2} (ref >59)
GFR calc non Af Amer: 76 mL/min/{1.73_m2} (ref >59)
Glucose: 193 mg/dL — ABNORMAL HIGH (ref 65–99)
Potassium: 4.1 mmol/L (ref 3.5–5.2)
Sodium: 140 mmol/L (ref 134–144)

## 2020-01-03 MED ORDER — GADOBUTROL 1 MMOL/ML IV SOLN
9.0000 mL | Freq: Once | INTRAVENOUS | Status: AC | PRN
  Administered 2020-01-03: 9 mL via INTRAVENOUS

## 2020-01-03 NOTE — Telephone Encounter (Signed)
Noted  

## 2020-01-03 NOTE — Progress Notes (Signed)
ERCP orders entered

## 2020-01-03 NOTE — Telephone Encounter (Signed)
Lab results were placed in Daniel Altes, PA's office. Lab were completed at [place].

## 2020-01-03 NOTE — Progress Notes (Signed)
LFTs and bilirubin much improved. Bilirubin slightly elevated at 1.7, but down from 6, 2 weeks ago. Alk phos elevated at 156, but down from 190 and ALT elevated at 51 but down from 86. AST has normalized.   I suspect patient likely passed a gallstone which caused his upper abdominal pain, jaundice, and elevated LFTs.   Lipase within normal limits. WBC, hemoglobin, and platelets within normal limits. Glucose is elevated at 193. Not sure if he was fasting, but he will need to follow-up with his PCP for diabetes management. Kidney function within normal limits. No UTI. No blood noted in the urine provided.   We will follow-up on MRI that is scheduled for today to evaluate his bile ducts. Further recommendations to follow.

## 2020-01-03 NOTE — Telephone Encounter (Signed)
Received call from radiology. MRI/MRCP with gallstones x 4 in CBD, dilated to 9 mm-1.0 cm. Reviewed recent ;abs from yesterday. WBC normal. LFTs improved. Called patient, pain somewhat improved (3/10), no N/V, fevers, chills.  Called and discussed case with Dr. Laural Golden. Plan outpatient ERCP on Monday. Will schedule through [facility]. Because of rapid turn-around, will need rapid COVID-19 test on Monday.   Discussed recommendations with patient, he agrees to proceed. ER precautions given. He requests someone call him with a time to be there on Monday.   Thank you for allowing [place] to participate in the care of [name], DNP, AGNP-C Adult & Gerontological Nurse Practitioner [facility]

## 2020-01-03 NOTE — Telephone Encounter (Signed)
Spoke with Sofie Rower (office administrator) at home to help with booking ERCP for Monday 01/06/20 based on Dr. Laural Golden recommendations for multiple CBD stones. Have been informed that an outpatient PAT for COVID-19 is needed (rather than rapid test on Monday). Anesthesia also wants cardiology clearence (cardiology is not in the hospital over the weekends). Have made multiple attempts to get the patient scheduled. Will discuss with Dr. Laural Golden when he is out of procedure for any further recommendations. Otherwise the patient has ER precautions for the weekend in case he should develop acute cholangitis symptoms.

## 2020-01-03 NOTE — Progress Notes (Signed)
error 

## 2020-01-03 NOTE — Telephone Encounter (Signed)
Lab results have been addressed. See result note.

## 2020-01-03 NOTE — Telephone Encounter (Signed)
Patient needs cardiology clearance. ERCP will need to be Tuesday. Dr. Laural Golden to work with his office for scheduling.

## 2020-01-06 ENCOUNTER — Encounter (HOSPITAL_COMMUNITY): Payer: Self-pay

## 2020-01-06 ENCOUNTER — Ambulatory Visit (HOSPITAL_COMMUNITY): Admit: 2020-01-06 | Payer: Medicare HMO | Admitting: Internal Medicine

## 2020-01-06 ENCOUNTER — Telehealth (INDEPENDENT_AMBULATORY_CARE_PROVIDER_SITE_OTHER): Payer: Self-pay | Admitting: *Deleted

## 2020-01-06 SURGERY — ERCP, WITH INTERVENTION IF INDICATED
Anesthesia: General

## 2020-01-06 NOTE — Telephone Encounter (Signed)
Dr.Rehman ask if we could get appointment for the patient with Cardiology for clearance to have ERCP.  Patient has Common Duct Stones and is jaundice . I called Heart care at [place].  Appointment was given for this Thursday , October 14 @ 2:20 pm. Requested notes . They were made aware that the patient had been referred to Dr.Rehman by [place] [facility] at [facility]. They may look into Epic and review his documentation as we cannot release it.  I called the patient and made him aware of his appointment, once we get clearance the ERCP will be arranged.  Dr.Rehman has been made aware.

## 2020-01-08 ENCOUNTER — Encounter: Payer: Self-pay | Admitting: Cardiology

## 2020-01-08 NOTE — Telephone Encounter (Signed)
Patient has been referred for cardiology evaluation.

## 2020-01-08 NOTE — Telephone Encounter (Signed)
Patient has appointment with cardiologist on 01/09/2020 for cardiology clearance.

## 2020-01-08 NOTE — Progress Notes (Signed)
Cardiology Office Note  Date: 01/09/2020   ID: Daniel Short, DOB 1945-10-22, MRN [id number]  PCP:  Sharion Balloon, FNP  Cardiologist:  Rozann Lesches, MD Electrophysiologist:  None   Chief Complaint  Patient presents with  . Cardiac evaluation    History of Present Illness: Daniel Short is a 74 y.o. male referred for cardiology consultation by Dr. Laural Golden for preprocedure cardiac assessment prior to planned ERCP. He is here today with his wife.  I reviewed his cardiac history, he is a patient of Dr. Quay Burow, last seen in 2018.  He has known CAD status post BMS x2 to the ostial and proximal RCA in 2003.  Follow-up Lexiscan Myoview in 2018 was low risk as noted below.  He does not report any obvious angina symptoms, but has had exertional fatigue and shortness of breath, including walking in from the parking lot today.  He is not on aspirin at this point.  I reviewed the remainder of his medications which are outlined below.  He has also been off Lipitor temporarily in the setting of jaundice during further GI work-up.  I personally reviewed his ECG today which shows sinus rhythm with PACs, left anterior fascicular block.  Past Medical History:  Diagnosis Date  . Arthritis   . CAD (coronary artery disease)    Status post proximal/ostial RCA Zeta stents 2003  . Ear infection 1979   Right ear due to wax build up  . Headache   . History of stroke 2000   Intracerebral hemorrhage    Past Surgical History:  Procedure Laterality Date  . CARPAL TUNNEL RELEASE    . CORONARY STENT PLACEMENT  2005  . HERNIA REPAIR    . KNEE ARTHROSCOPY Right   . LUMBAR LAMINECTOMY/DECOMPRESSION MICRODISCECTOMY N/A 09/14/2016   Procedure: Decompression L3-5, insitu fusion L4-5;  Surgeon: Melina Schools, MD;  Location: [facility];  Service: Orthopedics;  Laterality: N/A;  3 hrs  . LUMBAR LAMINECTOMY/DECOMPRESSION MICRODISCECTOMY N/A 09/17/2016   Procedure: WOUND EXPLORATION, GILL  DECOMPRESSION,FAR LATERAL DISCECTOMY LEFT L4-L5, AND EVACUATION OF HEMATOMA;  Surgeon: Melina Schools, MD;  Location: [facility];  Service: Orthopedics;  Laterality: N/A;    Current Outpatient Medications  Medication Sig Dispense Refill  . allopurinol (ZYLOPRIM) 100 MG tablet Take 1 tablet (100 mg total) by mouth daily. (Needs to be seen before next refill) 90 tablet 3  . atorvastatin (LIPITOR) 20 MG tablet Take 1 tablet (20 mg total) by mouth daily. 90 tablet 3  . blood glucose meter kit and supplies Dispense based on patient and insurance preference. Use up to four times daily as directed. (FOR ICD-10 E10.9, E11.9). 1 each 0  . Blood Glucose Monitoring Suppl ([place]) w/Device KIT Use to test blood sugar daily as directed. DX: E11.65 1 kit 1  . glucose blood (ONETOUCH VERIO) test strip Use to test blood sugar daily as directed. DX: E11.65 100 each 12  . HYDROcodone-acetaminophen (NORCO/VICODIN) 5-325 MG tablet as needed.     Marland Kitchen lisinopril (ZESTRIL) 20 MG tablet Take 1 tablet (20 mg total) by mouth daily. (Needs to be seen before next refill) 90 tablet 3  . metFORMIN (GLUCOPHAGE-XR) 500 MG 24 hr tablet Take 1 tablet (500 mg total) by mouth daily with breakfast. (Needs to be seen before next refill) 90 tablet 3  . OneTouch Delica Lancets 44L MISC Use to test blood sugar daily as directed. DX: E11.65 100 each 11   No current facility-administered medications for  this visit.   Allergies:  No known allergies   Social History: The patient  reports that he quit smoking about 4 years ago. His smoking use included cigarettes. He has a 13.50 pack-year smoking history. He has never used smokeless tobacco. He reports that he does not drink alcohol and does not use drugs.   Family History: The patient's family history includes COPD in his father; Heart attack in his brother, father, and mother.   ROS:  No palpitations or syncope.  Physical Exam: VS:  BP 116/70   Pulse 81   Ht 5' 7"   (1.702 m)   Wt 201 lb (91.2 kg)   SpO2 97%   BMI 31.48 kg/m , BMI Body mass index is 31.48 kg/m.  Wt Readings from Last 3 Encounters:  01/09/20 201 lb (91.2 kg)  01/01/20 205 lb 9.6 oz (93.3 kg)  12/19/19 206 lb (93.4 kg)    General: Patient appears comfortable at rest. HEENT: Conjunctiva and lids normal, wearing a mask. Neck: Supple, no elevated JVP or carotid bruits, no thyromegaly. Lungs: Clear to auscultation, nonlabored breathing at rest. Cardiac: Regular rate and rhythm, no S3, soft systolic murmur, no pericardial rub. Abdomen: Soft, nontender, bowel sounds present. Extremities: No pitting edema, distal pulses 2+. Skin: Warm and dry. Musculoskeletal: No kyphosis. Neuropsychiatric: Alert and oriented x3, affect grossly appropriate.  ECG:  An ECG dated 09/07/2016 was personally reviewed today and demonstrated:  Sinus rhythm with PAC, leftward axis with borderline low voltage in the limb leads, nonspecific ST-T wave abnormalities.  Recent Labwork: 01/02/2020: ALT 51; AST 27; BUN 8; Creatinine, Ser 0.98; Hemoglobin 15.6; Platelets 229; Potassium 4.1; Sodium 140     Component Value Date/Time   CHOL 101 12/19/2019 1026   TRIG 105 12/19/2019 1026   HDL 32 (L) 12/19/2019 1026   CHOLHDL 3.2 12/19/2019 1026   LDLCALC 49 12/19/2019 1026   LDLDIRECT 82 05/19/2016 1616    Other Studies Reviewed Today:  Lexiscan Myoview 09/09/2016:  The left ventricular ejection fraction is mildly decreased (45-54%).  Nuclear stress EF: 50%.  There was no ST segment deviation noted during stress.  Findings consistent with prior myocardial infarction.  This is a low risk study.   Small area of mid inferior wall infarct no ischemia EF estimated at 50% no discrete RWMA  Assessment and Plan:  1.  CAD status post BMS to the ostial and proximal RCA in 2003.  Last ischemic work-up was in 2018.  He does not report frank angina symptoms but does have exertional fatigue and dyspnea.  It is  anticipated that he will undergo an ERCP under sedation, generally well-tolerated from a cardiac perspective.  I would like to arrange a Lexiscan Myoview for general ischemic assessment, and unless he has any high risk features that require further assessment, I would anticipate he should be able to proceed with endoscopy.  Eventually, would be reasonable to resume aspirin 81 mg daily and also statin therapy.  In the meanwhile continue lisinopril.  2.  History of hyperlipidemia, temporarily off Lipitor.   Medication Adjustments/Labs and Tests Ordered: Current medicines are reviewed at length with the patient today.  Concerns regarding medicines are outlined above.   Tests Ordered: Orders Placed This Encounter  Procedures  . NM Myocar Multi W/Spect W/Wall Motion / EF  . EKG 12-Lead    Medication Changes: No orders of the defined types were placed in this encounter.   Disposition:  Follow up 6 months in the [place] office.  Signed,  Satira Sark, MD, [facility] 01/09/2020 2:48 PM    [facility] [address] [place], [place] [address] Phone: [phone]; Fax: [phone]

## 2020-01-09 ENCOUNTER — Ambulatory Visit: Payer: Medicare HMO | Admitting: Cardiology

## 2020-01-09 ENCOUNTER — Encounter: Payer: Self-pay | Admitting: Cardiology

## 2020-01-09 ENCOUNTER — Other Ambulatory Visit: Payer: Self-pay

## 2020-01-09 VITALS — BP 116/70 | HR 81 | Ht 67.0 in | Wt 201.0 lb

## 2020-01-09 DIAGNOSIS — Z01810 Encounter for preprocedural cardiovascular examination: Secondary | ICD-10-CM

## 2020-01-09 DIAGNOSIS — E782 Mixed hyperlipidemia: Secondary | ICD-10-CM

## 2020-01-09 DIAGNOSIS — I25119 Atherosclerotic heart disease of native coronary artery with unspecified angina pectoris: Secondary | ICD-10-CM

## 2020-01-09 NOTE — Patient Instructions (Signed)
Medication Instructions:  Your physician recommends that you continue on your current medications as directed. Please refer to the Current Medication list given to you today.  *If you need a refill on your cardiac medications before your next appointment, please call your pharmacy*   Lab Work: None today If you have labs (blood work) drawn today and your tests are completely normal, you will receive your results only by: . MyChart Message (if you have MyChart) OR . A paper copy in the mail If you have any lab test that is abnormal or we need to change your treatment, we will call you to review the results.   Testing/Procedures: Your physician has requested that you have a lexiscan myoview. For further information please visit [email or web address]. Please follow instruction sheet, as given.    Follow-Up: At [facility], you and your health needs are our priority.  As part of our continuing mission to provide you with exceptional heart care, we have created designated Provider Care Teams.  These Care Teams include your primary Cardiologist (physician) and Advanced Practice Providers (APPs -  Physician Assistants and Nurse Practitioners) who all work together to provide you with the care you need, when you need it.  We recommend signing up for the patient portal called "MyChart".  Sign up information is provided on this After Visit Summary.  MyChart is used to connect with patients for Virtual Visits (Telemedicine).  Patients are able to view lab/test results, encounter notes, upcoming appointments, etc.  Non-urgent messages can be sent to your provider as well.   To learn more about what you can do with MyChart, go to [email or web address].    Your next appointment:   6 month(s)  The format for your next appointment:   In Person  Provider:   Samuel McDowell, MD   Other Instructions None       Thank you for choosing [facility] !         

## 2020-01-15 ENCOUNTER — Encounter (HOSPITAL_COMMUNITY)
Admission: RE | Admit: 2020-01-15 | Discharge: 2020-01-15 | Disposition: A | Discharge status: Home / Self Care | Payer: Medicare HMO | Source: Ambulatory Visit | Attending: Cardiology | Admitting: Cardiology

## 2020-01-15 ENCOUNTER — Ambulatory Visit (HOSPITAL_COMMUNITY)
Admission: RE | Admit: 2020-01-15 | Discharge: 2020-01-15 | Disposition: A | Discharge status: Home / Self Care | Payer: Medicare HMO | Source: Ambulatory Visit | Attending: Cardiology | Admitting: Cardiology

## 2020-01-15 ENCOUNTER — Other Ambulatory Visit: Payer: Self-pay

## 2020-01-15 ENCOUNTER — Encounter (HOSPITAL_COMMUNITY): Payer: Self-pay

## 2020-01-15 DIAGNOSIS — Z01810 Encounter for preprocedural cardiovascular examination: Secondary | ICD-10-CM | POA: Diagnosis not present

## 2020-01-15 LAB — NM MYOCAR MULTI W/SPECT W/WALL MOTION / EF
LV dias vol: 100 mL (ref 62–150)
LV sys vol: 52 mL
Peak HR: 93 {beats}/min
RATE: 0.39
Rest HR: 63 {beats}/min
SDS: 3
SRS: 4
SSS: 7
TID: 1.07

## 2020-01-15 MED ORDER — TECHNETIUM TC 99M TETROFOSMIN IV KIT
30.0000 | PACK | Freq: Once | INTRAVENOUS | Status: AC | PRN
  Administered 2020-01-15: 30.8 via INTRAVENOUS

## 2020-01-15 MED ORDER — SODIUM CHLORIDE FLUSH 0.9 % IV SOLN
INTRAVENOUS | Status: AC
  Administered 2020-01-15: 10 mL via INTRAVENOUS
  Filled 2020-01-15: qty 10

## 2020-01-15 MED ORDER — REGADENOSON 0.4 MG/5ML IV SOLN
INTRAVENOUS | Status: AC
  Administered 2020-01-15: 0.4 mg via INTRAVENOUS
  Filled 2020-01-15: qty 5

## 2020-01-15 MED ORDER — TECHNETIUM TC 99M TETROFOSMIN IV KIT
10.0000 | PACK | Freq: Once | INTRAVENOUS | Status: AC | PRN
  Administered 2020-01-15: 10.4 via INTRAVENOUS

## 2020-01-16 ENCOUNTER — Encounter (HOSPITAL_COMMUNITY): Payer: Medicare HMO

## 2020-01-21 ENCOUNTER — Telehealth: Payer: Self-pay | Admitting: Gastroenterology

## 2020-01-21 NOTE — Telephone Encounter (Signed)
Hi Tammy,   I see you all arranged for patient to see cardiology in order to obtain cardiac clearance prior to ERCP. Looks like Myoview was completed on 10/20. Per Dr. Myles Gip note on the exam, "Low risk Myoview showing evidence of previous inferior infarct scar but importantly no large ischemic territories and low normal LVEF. He should be able to proceed with ERCP at an acceptable perioperative cardiac risk."   Not sure if your office as received anything from the cardiologist, but I wanted to update you in hopes to move forward with ERCP.   Forwarding to Dr. Laural Golden as Jul[facility]ier as well.   Thanks,  Aliene Altes, [facility]

## 2020-01-21 NOTE — Telephone Encounter (Signed)
Cardiology evaluation appreciated. Will proceed with ERCP with sphincterotomy ASAP

## 2020-01-22 ENCOUNTER — Other Ambulatory Visit (INDEPENDENT_AMBULATORY_CARE_PROVIDER_SITE_OTHER): Payer: Self-pay | Admitting: *Deleted

## 2020-01-22 ENCOUNTER — Other Ambulatory Visit (HOSPITAL_COMMUNITY)
Admission: RE | Admit: 2020-01-22 | Discharge: 2020-01-22 | Disposition: A | Discharge status: Home / Self Care | Payer: Medicare HMO | Source: Ambulatory Visit | Attending: Internal Medicine | Admitting: Internal Medicine

## 2020-01-22 ENCOUNTER — Ambulatory Visit (INDEPENDENT_AMBULATORY_CARE_PROVIDER_SITE_OTHER): Payer: Medicare HMO | Admitting: Internal Medicine

## 2020-01-22 ENCOUNTER — Inpatient Hospital Stay (HOSPITAL_COMMUNITY): Admission: RE | Admit: 2020-01-22 | Payer: Medicare HMO | Source: Ambulatory Visit

## 2020-01-22 DIAGNOSIS — K8051 Calculus of bile duct without cholangitis or cholecystitis with obstruction: Secondary | ICD-10-CM

## 2020-01-22 NOTE — Telephone Encounter (Signed)
Called patient to schedule, he would like to see Dr Laural Golden prior to scheduling procedure - he has OV 02/23/20, ERCP tentatively sch'd 02/06/20 - will give patient apt info at [place] 01/23/20

## 2020-01-22 NOTE — Telephone Encounter (Signed)
Ann- have you seen this?

## 2020-01-23 ENCOUNTER — Encounter (INDEPENDENT_AMBULATORY_CARE_PROVIDER_SITE_OTHER): Payer: Self-pay | Admitting: Internal Medicine

## 2020-01-23 ENCOUNTER — Other Ambulatory Visit: Payer: Self-pay

## 2020-01-23 ENCOUNTER — Ambulatory Visit (INDEPENDENT_AMBULATORY_CARE_PROVIDER_SITE_OTHER): Payer: Medicare HMO | Admitting: Internal Medicine

## 2020-01-23 DIAGNOSIS — K8050 Calculus of bile duct without cholangitis or cholecystitis without obstruction: Secondary | ICD-10-CM | POA: Insufficient documentation

## 2020-01-23 NOTE — Progress Notes (Signed)
Presenting complaint;  History of upper abdominal and chest pain. Patient found to have cholelithiasis as well as choledocholithiasis.  History of present illness.  Patient is 74 year old Caucasian male who was recently seen by Ms. Aliene Altes, PA at [facility] gastroenterology associates for jaundice and abdominal pain.  On work-up he was found to have choledocholithiasis.  Patient therefore referred for therapeutic ERCP. Cardiology evaluation was advised by anesthesiologist as patient has known coronary artery disease with remote stenting.  Patient was evaluated by Dr. Johnny Bridge and underwent myocardial scan which revealed inferior scar from previous infarct but no ischemic changes.  EF was low normal.  He felt his perioperative risk was acceptable.  Patient was scheduled for ERCP yesterday but he requested to be seen.  Patient is accompanied by his wife [name]. Patient states his present illness began on 09/03/2018 when he developed abdominal pain and and noted to be jaundiced when he was seen at [facility] family [facility].  He was referred to emergency room at [facility] where he was seen on 09/05/2018.  Abdominal pelvic CT revealed gallbladder wall thickening without biliary dilation or choledocholithiasis.  He did have hepatic steatosis.  Patient was subsequently seen at [facility] gastroenterology associates by Walden Field, NP.  Abdominal ultrasounds obtained along with elastography.  He was noted to have gallbladder wall thickening as well as gallstones fatty liver and elastography revealed F2 and some F3 fibrosis. Surgical consultation was recommended but as he felt better he did not pursue any any further. Once again he was noted to be jaundiced by PCP Ms. Evelina Dun FNP and referred back to [facility] gastroenterology associates. Abdominal pelvic CT was repeated.  It showed cholelithiasis without evidence of cholelithiasis normal pancreas and bile duct. Patient therefore underwent MRCP  on 01/03/2020 which revealed dilated intra and extrahepatic biliary system with 4 stones in common bile duct. Once again gallstones noted.  Patient's wife says that he has had all in all 5 episodes of upper abdominal and chest pain.  Each episode is lasted for no more than 3 hours.  Only on one occasion did he experience nausea and vomiting.  He has not experienced fever chills or night sweats with these episodes.  However he has not had a good appetite.  He has lost 14 pounds in last 3 months.  He denies itching.  His bowels move daily.  He denies rectal bleeding.  His stools at times have been black but he has been using Pepto-Bismol. Patient says he does not do much walking because of left knee arthritis.  He does experience shortness of breath when he walks for few minutes.  He does not experience chest pain. He has never undergone screening colonoscopy.    Current Medications: Outpatient Encounter Medications as of 01/23/2020  Medication Sig  . allopurinol (ZYLOPRIM) 100 MG tablet Take 1 tablet (100 mg total) by mouth daily. (Needs to be seen before next refill)  . blood glucose meter kit and supplies Dispense based on patient and insurance preference. Use up to four times daily as directed. (FOR ICD-10 E10.9, E11.9).  Marland Kitchen Blood Glucose Monitoring Suppl (ONETOUCH VERIO FLEX SYSTEM) w/Device KIT Use to test blood sugar daily as directed. DX: E11.65  . glucose blood (ONETOUCH VERIO) test strip Use to test blood sugar daily as directed. DX: E11.65  . HYDROcodone-acetaminophen (NORCO/VICODIN) 5-325 MG tablet as needed.   Marland Kitchen lisinopril (ZESTRIL) 20 MG tablet Take 1 tablet (20 mg total) by mouth daily. (Needs to be seen before next refill)  .  metFORMIN (GLUCOPHAGE-XR) 500 MG 24 hr tablet Take 1 tablet (500 mg total) by mouth daily with breakfast. (Needs to be seen before next refill)  . OneTouch Delica Lancets 58N MISC Use to test blood sugar daily as directed. DX: E11.65  . atorvastatin (LIPITOR) 20  MG tablet Take 1 tablet (20 mg total) by mouth daily. (Patient not taking: Reported on 01/23/2020)   No facility-administered encounter medications on file as of 01/23/2020.   Past medical history  Past Medical History:  Diagnosis Date  . Arthritis   . CAD (coronary artery disease)    Status post proximal/ostial RCA Zeta stents 2003  . Ear infection 1979   Right ear due to wax build up  . Headache   . History of stroke 2000   Intracerebral hemorrhage   Past Surgical History:  Procedure Laterality Date  . CARPAL TUNNEL RELEASE    . CORONARY STENT PLACEMENT  2005  . HERNIA REPAIR    . KNEE ARTHROSCOPY Right   . LUMBAR LAMINECTOMY/DECOMPRESSION MICRODISCECTOMY N/A 09/14/2016   Procedure: Decompression L3-5, insitu fusion L4-5;  Surgeon: Melina Schools, MD;  Location: [facility];  Service: Orthopedics;  Laterality: N/A;  3 hrs  . LUMBAR LAMINECTOMY/DECOMPRESSION MICRODISCECTOMY N/A 09/17/2016   Procedure: WOUND EXPLORATION, GILL DECOMPRESSION,FAR LATERAL DISCECTOMY LEFT L4-L5, AND EVACUATION OF HEMATOMA;  Surgeon: Melina Schools, MD;  Location: [facility];  Service: Orthopedics;  Laterality: N/A;   Allergies  Allergies  Allergen Reactions  . No Known Allergies    Family history  Father had severe COPD and died of MI at age 93. Mother also died of MI at age 33.  He has 1 sister living who is 70 years old.  Both brothers are disease.  One died of massive MI at age 90 another one died of malignant melanoma within a year of diagnosis at age 59.  Social history  Patient is married.  He has 2 sons ages 65 and 35 who both drink too much alcohol and are not doing well.  Patient worked in [employer] for 30 years and retired in 2003.  He smoked cigarettes until 2018.  He smoked anywhere from 8 to 10 cigarettes/day which he did for at least 89 years.  He used to drink alcohol socially but has not had any in over 10 years.   Objective: Blood pressure (!) 148/77, pulse 65, temperature 98.3 F (36.8  C), temperature source Oral, height _0  (1.702 m), weight 200 lb 14.4 oz (91.1 kg). Patient is alert and in no acute distress. He ambulates with help of cane. He is wearing a mask. Conjunctiva is pink. Sclera is nonicteric Oropharyngeal mucosa is normal. He has upper dental plate.  He has few remaining teeth and lower jaw which are all in very poor condition and some are mobile. No neck masses or thyromegaly noted. Cardiac exam with regular rhythm normal S1 and S2. No murmur or gallop noted. Lungs are clear to auscultation. Abdomen is full.  On palpation abdomen is soft.  He has mild tenderness below the right costal margin.  No organomegaly or masses. No LE edema or clubbing noted.  Labs/studies Results:  CBC Latest Ref Rng & Units 01/02/2020 12/19/2019 06/11/2019  WBC 3.4 - 10.8 x10E3/uL 7.5 6.3 10.2  Hemoglobin 13.0 - 17.7 g/dL 15.6 15.9 16.5  Hematocrit 37.5 - 51.0 % 45.9 46.8 49.7  Platelets 150 - 450 x10E3/uL 229 206 216    CMP Latest Ref Rng & Units 01/02/2020 12/19/2019 06/11/2019  Glucose 65 -  99 mg/dL 193(H) 133(H) 86  BUN 8 - 27 mg/dL _0 Creatinine 0.76 - 1.27 mg/dL 0.98 1.20 1.04  Sodium 134 - 144 mmol/L 140 142 141  Potassium 3.5 - 5.2 mmol/L 4.1 4.1 4.6  Chloride 96 - 106 mmol/L 102 100 101  CO2 20 - 29 mmol/L _1 Calcium 8.6 - 10.2 mg/dL 9.2 9.9 10.1  Total Protein 6.0 - 8.5 g/dL 6.7 6.9 7.1  Total Bilirubin 0.0 - 1.2 mg/dL 1.7(H) 6.0(H) 1.3(H)  Alkaline Phos 44 - 121 IU/L 156(H) 190(H) 60  AST 0 - 40 IU/L 27 86(H) 22  ALT 0 - 44 IU/L 51(H) 86(H) 21    Hepatic Function Latest Ref Rng & Units 01/02/2020 12/19/2019 06/11/2019  Total Protein 6.0 - 8.5 g/dL 6.7 6.9 7.1  Albumin 3.7 - 4.7 g/dL 4.0 4.2 4.4  AST 0 - 40 IU/L 27 86(H) 22  ALT 0 - 44 IU/L 51(H) 86(H) 21  Alk Phosphatase 44 - 121 IU/L 156(H) 190(H) 60  Total Bilirubin 0.0 - 1.2 mg/dL 1.7(H) 6.0(H) 1.3(H)  Bilirubin, Direct 0.00 - 0.40 mg/dL 0.62(H) 2.27(H) -    Abdominal pelvic CT and MR images  reviewed with patient and his wife.   Assessment:  #1.  Patient is 74 year old Caucasian male who presents with intermittent upper abdominal and chest pain and documented to have elevated transaminases and bilirubin on at least 2 occasions(June 2020 in September 2021).  On work-up he is found to have cholelithiasis as well as choledocholithiasis.  As above his bilirubin has improved.  He has been experiencing intermittent biliary obstruction which likely has been transient.  He has not experienced very pancreatitis or cholangitis. He needs to have the stones removed before he ends up with severe complication.  He should also consider cholecystectomy once bile duct has been cleared of stones. Procedure and risks reviewed with the patient in detail including potential complications.  Both he and his wife are agreeable to proceed.  #2.  Coronary artery disease.  He had MI back in 2003.  He was evaluated by Dr. Johnny Bridge of cardiology and Myoview did not reveal any ischemic areas and his risk is felt to be low/acceptable.  #3.  Patient appears to be deconditioned.  This may be partly due to left knee arthritis obesity and I wonder if he also has COPD.   Recommendations  We will schedule patient for ERCP with biliary sphincterotomy and stone extraction.  If for any reason stone cannot be removed biliary stent would be left in place. Patient advised to report to emergency room if he develops abdominal pain nausea vomiting fever or chills. Patient will be scheduled for ERCP in near future.

## 2020-01-23 NOTE — H&P (View-Only) (Signed)
Presenting complaint;  History of upper abdominal and chest pain. Patient found to have cholelithiasis as well as choledocholithiasis.  History of present illness.  Patient is 74 year old Caucasian male who was recently seen by Ms. Aliene Altes, PA at [facility] gastroenterology associates for jaundice and abdominal pain.  On work-up he was found to have choledocholithiasis.  Patient therefore referred for therapeutic ERCP. Cardiology evaluation was advised by anesthesiologist as patient has known coronary artery disease with remote stenting.  Patient was evaluated by Dr. Johnny Bridge and underwent myocardial scan which revealed inferior scar from previous infarct but no ischemic changes.  EF was low normal.  He felt his perioperative risk was acceptable.  Patient was scheduled for ERCP yesterday but he requested to be seen.  Patient is accompanied by his wife [name]. Patient states his present illness began on 09/03/2018 when he developed abdominal pain and and noted to be jaundiced when he was seen at [facility] family medicine.  He was referred to emergency room at [facility] where he was seen on 09/05/2018.  Abdominal pelvic CT revealed gallbladder wall thickening without biliary dilation or choledocholithiasis.  He did have hepatic steatosis.  Patient was subsequently seen at [facility] gastroenterology associates by Walden Field, NP.  Abdominal ultrasounds obtained along with elastography.  He was noted to have gallbladder wall thickening as well as gallstones fatty liver and elastography revealed F2 and some F3 fibrosis. Surgical consultation was recommended but as he felt better he did not pursue any any further. Once again he was noted to be jaundiced by PCP Ms. Evelina Dun FNP and referred back to [facility] gastroenterology associates. Abdominal pelvic CT was repeated.  It showed cholelithiasis without evidence of cholelithiasis normal pancreas and bile duct. Patient therefore underwent MRCP  on 01/03/2020 which revealed dilated intra and extrahepatic biliary system with 4 stones in common bile duct. Once again gallstones noted.  Patient's wife says that he has had all in all 5 episodes of upper abdominal and chest pain.  Each episode is lasted for no more than 3 hours.  Only on one occasion did he experience nausea and vomiting.  He has not experienced fever chills or night sweats with these episodes.  However he has not had a good appetite.  He has lost 14 pounds in last 3 months.  He denies itching.  His bowels move daily.  He denies rectal bleeding.  His stools at times have been black but he has been using Pepto-Bismol. Patient says he does not do much walking because of left knee arthritis.  He does experience shortness of breath when he walks for few minutes.  He does not experience chest pain. He has never undergone screening colonoscopy.    Current Medications: Outpatient Encounter Medications as of 01/23/2020  Medication Sig  . allopurinol (ZYLOPRIM) 100 MG tablet Take 1 tablet (100 mg total) by mouth daily. (Needs to be seen before next refill)  . blood glucose meter kit and supplies Dispense based on patient and insurance preference. Use up to four times daily as directed. (FOR ICD-10 E10.9, E11.9).  Marland Kitchen Blood Glucose Monitoring Suppl (ONETOUCH VERIO FLEX SYSTEM) w/Device KIT Use to test blood sugar daily as directed. DX: E11.65  . glucose blood (ONETOUCH VERIO) test strip Use to test blood sugar daily as directed. DX: E11.65  . HYDROcodone-acetaminophen (NORCO/VICODIN) 5-325 MG tablet as needed.   Marland Kitchen lisinopril (ZESTRIL) 20 MG tablet Take 1 tablet (20 mg total) by mouth daily. (Needs to be seen before next refill)  .  metFORMIN (GLUCOPHAGE-XR) 500 MG 24 hr tablet Take 1 tablet (500 mg total) by mouth daily with breakfast. (Needs to be seen before next refill)  . OneTouch Delica Lancets 58N MISC Use to test blood sugar daily as directed. DX: E11.65  . atorvastatin (LIPITOR) 20  MG tablet Take 1 tablet (20 mg total) by mouth daily. (Patient not taking: Reported on 01/23/2020)   No facility-administered encounter medications on file as of 01/23/2020.   Past medical history  Past Medical History:  Diagnosis Date  . Arthritis   . CAD (coronary artery disease)    Status post proximal/ostial RCA Zeta stents 2003  . Ear infection 1979   Right ear due to wax build up  . Headache   . History of stroke 2000   Intracerebral hemorrhage   Past Surgical History:  Procedure Laterality Date  . CARPAL TUNNEL RELEASE    . CORONARY STENT PLACEMENT  2005  . HERNIA REPAIR    . KNEE ARTHROSCOPY Right   . LUMBAR LAMINECTOMY/DECOMPRESSION MICRODISCECTOMY N/A 09/14/2016   Procedure: Decompression L3-5, insitu fusion L4-5;  Surgeon: Melina Schools, MD;  Location: [place];  Service: Orthopedics;  Laterality: N/A;  3 hrs  . LUMBAR LAMINECTOMY/DECOMPRESSION MICRODISCECTOMY N/A 09/17/2016   Procedure: WOUND EXPLORATION, GILL DECOMPRESSION,FAR LATERAL DISCECTOMY LEFT L4-L5, AND EVACUATION OF HEMATOMA;  Surgeon: Melina Schools, MD;  Location: [place];  Service: Orthopedics;  Laterality: N/A;   Allergies  Allergies  Allergen Reactions  . No Known Allergies    Family history  Father had severe COPD and died of MI at age 93. Mother also died of MI at age 33.  He has 1 sister living who is 70 years old.  Both brothers are disease.  One died of massive MI at age 90 another one died of malignant melanoma within a year of diagnosis at age 59.  Social history  Patient is married.  He has 2 sons ages 65 and 35 who both drink too much alcohol and are not doing well.  Patient worked in [employer] for 30 years and retired in 2003.  He smoked cigarettes until 2018.  He smoked anywhere from 8 to 10 cigarettes/day which he did for at least 89 years.  He used to drink alcohol socially but has not had any in over 10 years.   Objective: Blood pressure (!) 148/77, pulse 65, temperature 98.3 F (36.8  C), temperature source Oral, height _0  (1.702 m), weight 200 lb 14.4 oz (91.1 kg). Patient is alert and in no acute distress. He ambulates with help of cane. He is wearing a mask. Conjunctiva is pink. Sclera is nonicteric Oropharyngeal mucosa is normal. He has upper dental plate.  He has few remaining teeth and lower jaw which are all in very poor condition and some are mobile. No neck masses or thyromegaly noted. Cardiac exam with regular rhythm normal S1 and S2. No murmur or gallop noted. Lungs are clear to auscultation. Abdomen is full.  On palpation abdomen is soft.  He has mild tenderness below the right costal margin.  No organomegaly or masses. No LE edema or clubbing noted.  Labs/studies Results:  CBC Latest Ref Rng & Units 01/02/2020 12/19/2019 06/11/2019  WBC 3.4 - 10.8 x10E3/uL 7.5 6.3 10.2  Hemoglobin 13.0 - 17.7 g/dL 15.6 15.9 16.5  Hematocrit 37.5 - 51.0 % 45.9 46.8 49.7  Platelets 150 - 450 x10E3/uL 229 206 216    CMP Latest Ref Rng & Units 01/02/2020 12/19/2019 06/11/2019  Glucose 65 -  99 mg/dL 193(H) 133(H) 86  BUN 8 - 27 mg/dL _0 Creatinine 0.76 - 1.27 mg/dL 0.98 1.20 1.04  Sodium 134 - 144 mmol/L 140 142 141  Potassium 3.5 - 5.2 mmol/L 4.1 4.1 4.6  Chloride 96 - 106 mmol/L 102 100 101  CO2 20 - 29 mmol/L _1 Calcium 8.6 - 10.2 mg/dL 9.2 9.9 10.1  Total Protein 6.0 - 8.5 g/dL 6.7 6.9 7.1  Total Bilirubin 0.0 - 1.2 mg/dL 1.7(H) 6.0(H) 1.3(H)  Alkaline Phos 44 - 121 IU/L 156(H) 190(H) 60  AST 0 - 40 IU/L 27 86(H) 22  ALT 0 - 44 IU/L 51(H) 86(H) 21    Hepatic Function Latest Ref Rng & Units 01/02/2020 12/19/2019 06/11/2019  Total Protein 6.0 - 8.5 g/dL 6.7 6.9 7.1  Albumin 3.7 - 4.7 g/dL 4.0 4.2 4.4  AST 0 - 40 IU/L 27 86(H) 22  ALT 0 - 44 IU/L 51(H) 86(H) 21  Alk Phosphatase 44 - 121 IU/L 156(H) 190(H) 60  Total Bilirubin 0.0 - 1.2 mg/dL 1.7(H) 6.0(H) 1.3(H)  Bilirubin, Direct 0.00 - 0.40 mg/dL 0.62(H) 2.27(H) -    Abdominal pelvic CT and MR images  reviewed with patient and his wife.   Assessment:  #1.  Patient is 74 year old Caucasian male who presents with intermittent upper abdominal and chest pain and documented to have elevated transaminases and bilirubin on at least 2 occasions(June 2020 in September 2021).  On work-up he is found to have cholelithiasis as well as choledocholithiasis.  As above his bilirubin has improved.  He has been experiencing intermittent biliary obstruction which likely has been transient.  He has not experienced very pancreatitis or cholangitis. He needs to have the stones removed before he ends up with severe complication.  He should also consider cholecystectomy once bile duct has been cleared of stones. Procedure and risks reviewed with the patient in detail including potential complications.  Both he and his wife are agreeable to proceed.  #2.  Coronary artery disease.  He had MI back in 2003.  He was evaluated by Dr. Johnny Bridge of cardiology and Myoview did not reveal any ischemic areas and his risk is felt to be low/acceptable.  #3.  Patient appears to be deconditioned.  This may be partly due to left knee arthritis obesity and I wonder if he also has COPD.   Recommendations  We will schedule patient for ERCP with biliary sphincterotomy and stone extraction.  If for any reason stone cannot be removed biliary stent would be left in place. Patient advised to report to emergency room if he develops abdominal pain nausea vomiting fever or chills. Patient will be scheduled for ERCP in near future.

## 2020-01-23 NOTE — Patient Instructions (Signed)
ERCP with sphincterotomy and stone extraction to be scheduled. Please call office: Emergency room if you have abdominal pain fever or chills.

## 2020-01-27 ENCOUNTER — Ambulatory Visit (INDEPENDENT_AMBULATORY_CARE_PROVIDER_SITE_OTHER): Payer: Medicare HMO | Admitting: Family Medicine

## 2020-01-27 ENCOUNTER — Encounter: Payer: Self-pay | Admitting: Family Medicine

## 2020-01-27 ENCOUNTER — Other Ambulatory Visit: Payer: Self-pay

## 2020-01-27 ENCOUNTER — Ambulatory Visit (INDEPENDENT_AMBULATORY_CARE_PROVIDER_SITE_OTHER): Payer: Medicare HMO

## 2020-01-27 VITALS — BP 134/83 | HR 75 | Temp 97.8°F | Resp 20 | Ht 67.0 in | Wt 201.0 lb

## 2020-01-27 DIAGNOSIS — M25562 Pain in left knee: Secondary | ICD-10-CM

## 2020-01-27 MED ORDER — PREDNISONE 10 MG PO TABS: ORAL_TABLET | ORAL | 0 refills | Status: DC

## 2020-01-27 NOTE — Progress Notes (Signed)
No chief complaint on file.   HPI  Patient presents today for increasing pain in the left knee for several weeks. He is ambulatory with pain. Has had previous right knee surgery but no replacement and no left knee procedures.   PMH: Smoking status noted ROS: Per HPI  Objective: BP 134/83   Pulse 75   Temp 97.8 F (36.6 C) (Temporal)   Resp 20   Ht 5\' 7"  (1.702 m)   Wt 201 lb (91.2 kg)   SpO2 97%   BMI 31.48 kg/m  Gen: NAD, alert, cooperative with exam HEENT: NCAT CV: RRR, good S1/S2, no murmur Resp: CTABL, no wheezes, non-labored  Ext: No edema, warm Neuro: Alert and oriented, No gross deficits  Assessment and plan:  1. Acute pain of left knee   left knee shows joint narrowing at the medial space. NEar bone on bone. No acute changes.  Meds ordered this encounter  Medications  . predniSONE (DELTASONE) 10 MG tablet    Sig: Take 5 daily for 2 days followed by 4,3,2 and 1 for 2 days each.    Dispense:  30 tablet    Refill:  0    Orders Placed This Encounter  Procedures  . DG Knee 1-2 Views Left    Order Specific Question:   Reason for Exam (SYMPTOM  OR DIAGNOSIS REQUIRED)    Answer:   knee pain    Order Specific Question:   Preferred imaging location?    Answer:   Internal    Follow up as needed.  Claretta Fraise, MD

## 2020-02-03 NOTE — Patient Instructions (Signed)
Daniel Short  02/03/2020     @PREFPERIOPPHARMACY @   Your procedure is scheduled on  02/06/2020.  Report to [facility] at  1155  A.M.  Call this number if you have problems the morning of surgery:  [phone]   Remember:  Follow the diet instructions given to you by the office.                        Take these medicines the morning of surgery with A SIP OF WATER  Allopurinol, hydrocodone (if needed).    Do not wear jewelry, make-up or nail polish.  Do not wear lotions, powders, or perfumes. Please wear deodorant and brush your teeth.  Do not shave 48 hours prior to surgery.  Men may shave face and neck.  Do not bring valuables to the hospital.  [facility] is not responsible for any belongings or valuables.  Contacts, dentures or bridgework may not be worn into surgery.  Leave your suitcase in the car.  After surgery it may be brought to your room.  For patients admitted to the hospital, discharge time will be determined by your treatment team.  Patients discharged the day of surgery will not be allowed to drive home.   Name and phone number of your driver:   family Special instructions:   DO NOT smoke the morning of your procedure.  Please read over the following fact sheets that you were given. Anesthesia Post-op Instructions and Care and Recovery After Surgery       Endoscopic Retrograde Cholangiopancreatogram, Care After This sheet gives you information about how to care for yourself after your procedure. Your health care provider may also give you more specific instructions. If you have problems or questions, contact your health care provider. What can I expect after the procedure? After the procedure, it is common to have:  Soreness in your throat.  Nausea.  Bloating.  Dizziness.  Tiredness (fatigue). Follow these instructions at home:   Take over-the-counter and prescription medicines only as told by your health care provider.  Do not  drive for 24 hours if you were given a medicine to help you relax (sedative) during your procedure. Have someone stay with you for 24 hours after the procedure.  Return to your normal activities as told by your health care provider. Ask your health care provider what activities are safe for you.  Return to eating what you normally do as soon as you feel well enough or as told by your health care provider.  Keep all follow-up visits as told by your health care provider. This is important. Contact a health care provider if:  You have pain in your abdomen that does not get better with medicine.  You develop signs of infection, such as: ? Chills. ? Feeling unwell. Get help right away if:  You have difficulty swallowing.  You have worsening pain in your throat, chest, or abdomen.  You vomit bright red blood or a substance that looks like coffee grounds.  You have bloody or very black stools.  You have a fever.  You have a sudden increase in swelling (bloating) in your abdomen. Summary  After the procedure, it is common to feel tired and to have some discomfort in your throat.  Contact your health care provider if you have signs of infection--such as chills or feeling unwell--or if you have pain that does not improve with medicine.  Get  help right away if you have trouble swallowing, worsening pain, bloody or black vomit, bloody or black stools, a fever, or increased swelling in your abdomen.  Keep all follow-up visits as told by your health care provider. This is important. This information is not intended to replace advice given to you by your health care provider. Make sure you discuss any questions you have with your health care provider. Document Revised: 02/24/2017 Document Reviewed: 02/01/2016 Elsevier Patient Education  2020 [place] Anesthesia, Adult, Care After This sheet gives you information about how to care for yourself after your procedure. Your health  care provider may also give you more specific instructions. If you have problems or questions, contact your health care provider. What can I expect after the procedure? After the procedure, the following side effects are common:  Pain or discomfort at the IV site.  Nausea.  Vomiting.  Sore throat.  Trouble concentrating.  Feeling cold or chills.  Weak or tired.  Sleepiness and fatigue.  Soreness and body aches. These side effects can affect parts of the body that were not involved in surgery. Follow these instructions at home:  For at least 24 hours after the procedure:  Have a responsible adult stay with you. It is important to have someone help care for you until you are awake and alert.  Rest as needed.  Do not: ? Participate in activities in which you could fall or become injured. ? Drive. ? Use heavy machinery. ? Drink alcohol. ? Take sleeping pills or medicines that cause drowsiness. ? Make important decisions or sign legal documents. ? Take care of children on your own. Eating and drinking  Follow any instructions from your health care provider about eating or drinking restrictions.  When you feel hungry, start by eating small amounts of foods that are soft and easy to digest (bland), such as toast. Gradually return to your regular diet.  Drink enough fluid to keep your urine pale yellow.  If you vomit, rehydrate by drinking water, juice, or clear broth. General instructions  If you have sleep apnea, surgery and certain medicines can increase your risk for breathing problems. Follow instructions from your health care provider about wearing your sleep device: ? Anytime you are sleeping, including during daytime naps. ? While taking prescription pain medicines, sleeping medicines, or medicines that make you drowsy.  Return to your normal activities as told by your health care provider. Ask your health care provider what activities are safe for you.  Take  over-the-counter and prescription medicines only as told by your health care provider.  If you smoke, do not smoke without supervision.  Keep all follow-up visits as told by your health care provider. This is important. Contact a health care provider if:  You have nausea or vomiting that does not get better with medicine.  You cannot eat or drink without vomiting.  You have pain that does not get better with medicine.  You are unable to pass urine.  You develop a skin rash.  You have a fever.  You have redness around your IV site that gets worse. Get help right away if:  You have difficulty breathing.  You have chest pain.  You have blood in your urine or stool, or you vomit blood. Summary  After the procedure, it is common to have a sore throat or nausea. It is also common to feel tired.  Have a responsible adult stay with you for the first 24 hours after general  anesthesia. It is important to have someone help care for you until you are awake and alert.  When you feel hungry, start by eating small amounts of foods that are soft and easy to digest (bland), such as toast. Gradually return to your regular diet.  Drink enough fluid to keep your urine pale yellow.  Return to your normal activities as told by your health care provider. Ask your health care provider what activities are safe for you. This information is not intended to replace advice given to you by your health care provider. Make sure you discuss any questions you have with your health care provider. Document Revised: 03/17/2017 Document Reviewed: 10/28/2016 Elsevier Patient Education  [place].

## 2020-02-04 ENCOUNTER — Encounter (HOSPITAL_COMMUNITY): Payer: Self-pay

## 2020-02-04 ENCOUNTER — Encounter (HOSPITAL_COMMUNITY)
Admission: RE | Admit: 2020-02-04 | Discharge: 2020-02-04 | Disposition: A | Discharge status: Home / Self Care | Payer: Medicare HMO | Source: Ambulatory Visit | Attending: Internal Medicine | Admitting: Internal Medicine

## 2020-02-04 ENCOUNTER — Other Ambulatory Visit: Payer: Self-pay

## 2020-02-04 ENCOUNTER — Other Ambulatory Visit (HOSPITAL_COMMUNITY)
Admission: RE | Admit: 2020-02-04 | Discharge: 2020-02-04 | Disposition: A | Discharge status: Home / Self Care | Payer: Medicare HMO | Source: Ambulatory Visit | Attending: Internal Medicine | Admitting: Internal Medicine

## 2020-02-04 DIAGNOSIS — Z20822 Contact with and (suspected) exposure to covid-19: Secondary | ICD-10-CM | POA: Insufficient documentation

## 2020-02-04 DIAGNOSIS — Z01812 Encounter for preprocedural laboratory examination: Secondary | ICD-10-CM | POA: Diagnosis not present

## 2020-02-04 DIAGNOSIS — K8051 Calculus of bile duct without cholangitis or cholecystitis with obstruction: Secondary | ICD-10-CM | POA: Insufficient documentation

## 2020-02-04 HISTORY — DX: Type 2 diabetes mellitus without complications: E11.9

## 2020-02-04 LAB — HEPATIC FUNCTION PANEL
ALT: 38 U/L (ref 0–44)
AST: 35 U/L (ref 15–41)
Albumin: 4.2 g/dL (ref 3.5–5.0)
Alkaline Phosphatase: 99 U/L (ref 38–126)
Bilirubin, Direct: 0.3 mg/dL — ABNORMAL HIGH (ref 0.0–0.2)
Indirect Bilirubin: 1.3 mg/dL — ABNORMAL HIGH (ref 0.3–0.9)
Total Bilirubin: 1.6 mg/dL — ABNORMAL HIGH (ref 0.3–1.2)
Total Protein: 7.6 g/dL (ref 6.5–8.1)

## 2020-02-04 LAB — LIPASE, BLOOD: Lipase: 63 U/L — ABNORMAL HIGH (ref 11–51)

## 2020-02-05 LAB — SARS CORONAVIRUS 2 (TAT 6-24 HRS): SARS Coronavirus 2: NEGATIVE

## 2020-02-06 ENCOUNTER — Encounter (HOSPITAL_COMMUNITY)
Admission: RE | Disposition: A | Discharge status: Home / Self Care | Payer: Self-pay | Source: Ambulatory Visit | Attending: Internal Medicine

## 2020-02-06 ENCOUNTER — Encounter (HOSPITAL_COMMUNITY): Payer: Self-pay | Admitting: Internal Medicine

## 2020-02-06 ENCOUNTER — Ambulatory Visit (HOSPITAL_COMMUNITY)
Admission: RE | Admit: 2020-02-06 | Discharge: 2020-02-06 | Disposition: A | Discharge status: Home / Self Care | Payer: Medicare HMO | Source: Ambulatory Visit | Attending: Internal Medicine | Admitting: Internal Medicine

## 2020-02-06 ENCOUNTER — Other Ambulatory Visit: Payer: Self-pay

## 2020-02-06 ENCOUNTER — Ambulatory Visit (HOSPITAL_COMMUNITY): Payer: Medicare HMO | Admitting: Anesthesiology

## 2020-02-06 ENCOUNTER — Ambulatory Visit (HOSPITAL_COMMUNITY): Payer: Medicare HMO

## 2020-02-06 DIAGNOSIS — K2990 Gastroduodenitis, unspecified, without bleeding: Secondary | ICD-10-CM | POA: Insufficient documentation

## 2020-02-06 DIAGNOSIS — Z8673 Personal history of transient ischemic attack (TIA), and cerebral infarction without residual deficits: Secondary | ICD-10-CM | POA: Diagnosis not present

## 2020-02-06 DIAGNOSIS — I2510 Atherosclerotic heart disease of native coronary artery without angina pectoris: Secondary | ICD-10-CM | POA: Diagnosis not present

## 2020-02-06 DIAGNOSIS — Z79899 Other long term (current) drug therapy: Secondary | ICD-10-CM | POA: Insufficient documentation

## 2020-02-06 DIAGNOSIS — Z6830 Body mass index (BMI) 30.0-30.9, adult: Secondary | ICD-10-CM | POA: Insufficient documentation

## 2020-02-06 DIAGNOSIS — E669 Obesity, unspecified: Secondary | ICD-10-CM | POA: Diagnosis not present

## 2020-02-06 DIAGNOSIS — Z7984 Long term (current) use of oral hypoglycemic drugs: Secondary | ICD-10-CM | POA: Diagnosis not present

## 2020-02-06 DIAGNOSIS — Z87891 Personal history of nicotine dependence: Secondary | ICD-10-CM | POA: Diagnosis not present

## 2020-02-06 DIAGNOSIS — K8050 Calculus of bile duct without cholangitis or cholecystitis without obstruction: Secondary | ICD-10-CM | POA: Insufficient documentation

## 2020-02-06 DIAGNOSIS — K838 Other specified diseases of biliary tract: Secondary | ICD-10-CM | POA: Insufficient documentation

## 2020-02-06 DIAGNOSIS — I252 Old myocardial infarction: Secondary | ICD-10-CM | POA: Diagnosis not present

## 2020-02-06 DIAGNOSIS — Q445 Other congenital malformations of bile ducts: Secondary | ICD-10-CM

## 2020-02-06 DIAGNOSIS — K2980 Duodenitis without bleeding: Secondary | ICD-10-CM | POA: Diagnosis not present

## 2020-02-06 DIAGNOSIS — K8051 Calculus of bile duct without cholangitis or cholecystitis with obstruction: Secondary | ICD-10-CM

## 2020-02-06 HISTORY — PX: ERCP: SHX5425

## 2020-02-06 HISTORY — PX: SPHINCTEROTOMY: SHX5544

## 2020-02-06 HISTORY — PX: BILIARY STENT PLACEMENT: SHX5538

## 2020-02-06 LAB — GLUCOSE, CAPILLARY
Glucose-Capillary: 99 mg/dL (ref 70–99)
Glucose-Capillary: 157 mg/dL — ABNORMAL HIGH (ref 70–99)

## 2020-02-06 SURGERY — ERCP, WITH INTERVENTION IF INDICATED
Anesthesia: General | Site: Esophagus

## 2020-02-06 MED ORDER — FENTANYL CITRATE (PF) 100 MCG/2ML IJ SOLN
INTRAMUSCULAR | Status: AC
  Filled 2020-02-06: qty 2

## 2020-02-06 MED ORDER — GLUCAGON HCL RDNA (DIAGNOSTIC) 1 MG IJ SOLR
INTRAMUSCULAR | Status: DC | PRN
  Administered 2020-02-06: .25 mg via INTRAVENOUS

## 2020-02-06 MED ORDER — PROPOFOL 10 MG/ML IV BOLUS
INTRAVENOUS | Status: AC
  Filled 2020-02-06: qty 20

## 2020-02-06 MED ORDER — ONDANSETRON HCL 4 MG/2ML IJ SOLN
INTRAMUSCULAR | Status: DC | PRN
  Administered 2020-02-06: 4 mg via INTRAVENOUS

## 2020-02-06 MED ORDER — CHLORHEXIDINE GLUCONATE 0.12 % MT SOLN: 15.0000 mL | Freq: Once | OROMUCOSAL | Status: DC

## 2020-02-06 MED ORDER — SODIUM CHLORIDE 0.9 % IV SOLN
INTRAVENOUS | Status: DC | PRN
  Administered 2020-02-06: 40 mL

## 2020-02-06 MED ORDER — ORAL CARE MOUTH RINSE: 15.0000 mL | Freq: Once | OROMUCOSAL | Status: DC

## 2020-02-06 MED ORDER — STERILE WATER FOR IRRIGATION IR SOLN
Status: DC | PRN
  Administered 2020-02-06: 1000 mL

## 2020-02-06 MED ORDER — CEFAZOLIN SODIUM-DEXTROSE 2-4 GM/100ML-% IV SOLN
2.0000 g | INTRAVENOUS | Status: AC
  Administered 2020-02-06: 2 g via INTRAVENOUS
  Filled 2020-02-06: qty 100

## 2020-02-06 MED ORDER — SODIUM CHLORIDE 0.9 % IV SOLN
INTRAVENOUS | Status: AC
  Filled 2020-02-06: qty 50

## 2020-02-06 MED ORDER — HYDROMORPHONE HCL 1 MG/ML IJ SOLN: 0.2500 mg | INTRAMUSCULAR | Status: DC | PRN

## 2020-02-06 MED ORDER — LACTATED RINGERS IV SOLN: INTRAVENOUS | Status: DC | PRN

## 2020-02-06 MED ORDER — FENTANYL CITRATE (PF) 100 MCG/2ML IJ SOLN
INTRAMUSCULAR | Status: DC | PRN
  Administered 2020-02-06: 100 ug via INTRAVENOUS

## 2020-02-06 MED ORDER — SUGAMMADEX SODIUM 200 MG/2ML IV SOLN
INTRAVENOUS | Status: DC | PRN
  Administered 2020-02-06: 200 mg via INTRAVENOUS

## 2020-02-06 MED ORDER — LIDOCAINE 2% (20 MG/ML) 5 ML SYRINGE
INTRAMUSCULAR | Status: DC | PRN
  Administered 2020-02-06: 200 mg via INTRAVENOUS

## 2020-02-06 MED ORDER — CHLORHEXIDINE GLUCONATE CLOTH 2 % EX PADS: 6.0000 | MEDICATED_PAD | Freq: Once | CUTANEOUS | Status: DC

## 2020-02-06 MED ORDER — EPHEDRINE SULFATE 50 MG/ML IJ SOLN
INTRAMUSCULAR | Status: DC | PRN
  Administered 2020-02-06: 10 mg via INTRAVENOUS

## 2020-02-06 MED ORDER — LIDOCAINE 2% (20 MG/ML) 5 ML SYRINGE
INTRAMUSCULAR | Status: AC
  Filled 2020-02-06: qty 5

## 2020-02-06 MED ORDER — ONDANSETRON HCL 4 MG/2ML IJ SOLN
INTRAMUSCULAR | Status: AC
  Filled 2020-02-06: qty 2

## 2020-02-06 MED ORDER — ROCURONIUM BROMIDE 10 MG/ML (PF) SYRINGE
PREFILLED_SYRINGE | INTRAVENOUS | Status: DC | PRN
  Administered 2020-02-06: 40 mg via INTRAVENOUS

## 2020-02-06 MED ORDER — ROCURONIUM BROMIDE 10 MG/ML (PF) SYRINGE
PREFILLED_SYRINGE | INTRAVENOUS | Status: AC
  Filled 2020-02-06: qty 10

## 2020-02-06 MED ORDER — PROPOFOL 10 MG/ML IV BOLUS
INTRAVENOUS | Status: DC | PRN
  Administered 2020-02-06: 150 mg via INTRAVENOUS

## 2020-02-06 MED ORDER — ONDANSETRON HCL 4 MG/2ML IJ SOLN: 4.0000 mg | Freq: Once | INTRAMUSCULAR | Status: DC | PRN

## 2020-02-06 MED ORDER — LACTATED RINGERS IV SOLN: Freq: Once | INTRAVENOUS | Status: AC

## 2020-02-06 SURGICAL SUPPLY — 26 items
BALLN RETRIEVAL 12X15 (BALLOONS) IMPLANT
BALLN RETRIEVAL 12X15MM (BALLOONS)
BALN RTRVL 200 6-7FR 12-15 (BALLOONS)
BASKET TRAPEZOID 3X6 (MISCELLANEOUS) IMPLANT
BASKET TRAPEZOID LITHO 2.0X5 (MISCELLANEOUS) ×1 IMPLANT
BSKT STON RTRVL TRAPEZOID 2X5 (MISCELLANEOUS)
BSKT STON RTRVL TRAPEZOID 3X6 (MISCELLANEOUS)
DEVICE INFLATION ENCORE 26 (MISCELLANEOUS) IMPLANT
DEVICE LOCKING W-BIOPSY CAP (MISCELLANEOUS) ×1 IMPLANT
GUIDEWIRE HYDRA JAGWIRE .35 (WIRE) IMPLANT
GUIDEWIRE JAG HINI 025X260CM (WIRE) IMPLANT
KIT ENDO PROCEDURE PEN (KITS) ×1 IMPLANT
KIT TURNOVER KIT A (KITS) ×1 IMPLANT
LUBRICANT JELLY 4.5OZ STERILE (MISCELLANEOUS) IMPLANT
PAD ARMBOARD 7.5X6 YLW CONV (MISCELLANEOUS) ×1 IMPLANT
POSITIONER HEAD 8X9X4 ADT (SOFTGOODS) IMPLANT
SCOPE SPY DS DISPOSABLE (MISCELLANEOUS) ×1 IMPLANT
SNARE ROTATE MED OVAL 20MM (MISCELLANEOUS) IMPLANT
SNARE SHORT THROW 13M SML OVAL (MISCELLANEOUS) IMPLANT
SPHINCTEROTOME AUTOTOME .25 (MISCELLANEOUS) IMPLANT
SPHINCTEROTOME HYDRATOME 44 (MISCELLANEOUS) ×1 IMPLANT
SYSTEM CONTINUOUS INJECTION (MISCELLANEOUS) ×1 IMPLANT
TUBING INSUFFLATOR CO2MPACT (TUBING) ×1 IMPLANT
WALLSTENT METAL COVERED 10X60 (STENTS) IMPLANT
WALLSTENT METAL COVERED 10X80 (STENTS) IMPLANT
WATER STERILE IRR 1000ML POUR (IV SOLUTION) ×1 IMPLANT

## 2020-02-06 NOTE — Op Note (Signed)
[facility] Patient Name: Daniel Short Procedure Date: 02/06/2020 1:14 PM MRN: [id number] Date of Birth: [birth year] Attending MD: Hildred Laser , MD CSN: [id number] Age: 74 Admit Type: Outpatient Procedure:                ERCP Indications:              For therapy of bile duct stone(s) Providers:                Hildred Laser, MD, Otis Peak B. Sharon Seller, RN, Wynonia Musty Tech, Technician, Aram Candela,                            Kristine L. Risa Grill, Technician Referring MD:             Aliene Altes, PA-C and Evelina Dun, FNP Medicines:                General Anesthesia Complications:            No immediate complications. Estimated Blood Loss:     Estimated blood loss: none. Procedure:                Pre-Anesthesia Assessment:                           - Prior to the procedure, a History and Physical                            was performed, and patient medications and                            allergies were reviewed. The patient's tolerance of                            previous anesthesia was also reviewed. The risks                            and benefits of the procedure and the sedation                            options and risks were discussed with the patient.                            All questions were answered, and informed consent                            was obtained. Prior Anticoagulants: The patient has                            taken no previous anticoagulant or antiplatelet                            agents. ASA Grade Assessment: III - A patient with  severe systemic disease. After reviewing the risks                            and benefits, the patient was deemed in                            satisfactory condition to undergo the procedure.                           - Prior to the procedure, a History and Physical                            was performed, and patient medications and                             allergies were reviewed. The patient's tolerance of                            previous anesthesia was also reviewed. The risks                            and benefits of the procedure and the sedation                            options and risks were discussed with the patient.                            All questions were answered, and informed consent                            was obtained. Prior Anticoagulants: The patient has                            taken no previous anticoagulant or antiplatelet                            agents. ASA Grade Assessment: III - A patient with                            severe systemic disease. After reviewing the risks                            and benefits, the patient was deemed in                            satisfactory condition to undergo the procedure.                           After obtaining informed consent, the scope was                            passed under direct vision. Throughout the  procedure, the patient's blood pressure, pulse, and                            oxygen saturations were monitored continuously. The                            [id number]-[id number] ([id number]) scope was introduced through                            the mouth, and used to inject contrast into and                            used to inject contrast into the bile duct. The                            ERCP was accomplished without difficulty. The                            patient tolerated the procedure well. Scope In: 1:58:39 PM Scope Out: 2:41:03 PM Total Procedure Duration: 0 hours 42 minutes 24 seconds  Findings:      The scout film was normal. The esophagus was successfully intubated       under direct vision. Antral erythema and edema along with erosive bulbar       duodenitis noted. Ampulla of Vater was located inside a shallow       diverticulum. It had normal appearance. Bile duct deeply cannulated with       Hydra tome  without any difficulty. Biliary system filled with contrast.       CBD and CHD mildly dilated. Intrahepatic biliary radicles were normal.       Filling defects noted in distal CBD. Biliary sphincterotomy performed.       Stone removal was accomplished by using stone balloon extractor and       Dormia basket. Large amount of debris or fragments were removed along       with large stone with basket. 1 stone could not be removed. Therefore 10       French 7 cm long plastic stent placed in the bile duct for biliary       decompression. Patient tolerated procedure well. Impression:               - Gastroduodenitis                           - Dilated CBD and CHD with multiple filling defects.                           - Biliary sphincterotomy performed and two stones                            were removed along with debris but one stone could                            not be removed.                           -  10 French 7 cm long plastic biliary stent placed                            for decompression. Moderate Sedation:      Per Anesthesia Care Recommendation:           - Discharge patient to home (with spouse).                           - Avoid aspirin and nonsteroidal anti-inflammatory                            medicines for 3 days.                           - Clear liquid diet today. Usual diet start                            tomorrow morning.                           -H. pylori serology today.                           - Return to GI clinic in 8 weeks. Procedure Code(s):        --- Professional ---                           [phone], Endoscopic retrograde                            cholangiopancreatography (ERCP); diagnostic,                            including collection of specimen(s) by brushing or                            washing, when performed (separate procedure) Diagnosis Code(s):        --- Professional ---                           K29.80, Duodenitis without bleeding                            K80.50, Calculus of bile duct without cholangitis                            or cholecystitis without obstruction CPT copyright 2019 American Medical Association. All rights reserved. The codes documented in this report are preliminary and upon coder review may  be revised to meet current compliance requirements. Hildred Laser, MD Hildred Laser, MD 02/06/2020 3:08:16 PM This report has been signed electronically. Number of Addenda: 0

## 2020-02-06 NOTE — Transfer of Care (Signed)
Immediate Anesthesia Transfer of Care Note  Patient: Daniel Short  Procedure(s) Performed: ENDOSCOPIC RETROGRADE CHOLANGIOPANCREATOGRAPHY (ERCP) with stone extraction (N/A Esophagus) SPHINCTEROTOMY (N/A Esophagus) BILIARY STENT PLACEMENT (N/A Esophagus)  Patient Location: PACU  Anesthesia Type:General  Level of Consciousness: awake, alert , oriented and patient cooperative  Airway & Oxygen Therapy: Patient Spontanous Breathing  Post-op Assessment: Report given to RN and Post -op Vital signs reviewed and stable  Post vital signs: Reviewed and stable  Last Vitals:  Vitals Value Taken Time  BP 141/77 02/06/20 1454  Temp    Pulse 85 02/06/20 1459  Resp 15 02/06/20 1459  SpO2 98 % 02/06/20 1459  Vitals shown include unvalidated device data.  Last Pain:  Vitals:   02/06/20 1211  TempSrc: Oral  PainSc: 0-No pain      Patients Stated Pain Goal: 7 (44/03/47 [id number])  Complications: No complications documented.

## 2020-02-06 NOTE — Discharge Instructions (Signed)
No aspirin or NSAIDs for 3 days. Clear liquids today.  Resume usual diet starting tomorrow morning. Resume other medications as before. Office visit in 8 weeks.   Endoscopic Retrograde Cholangiopancreatogram, Care After This sheet gives you information about how to care for yourself after your procedure. Your health care provider may also give you more specific instructions. If you have problems or questions, contact your health care provider. What can I expect after the procedure? After the procedure, it is common to have:  Soreness in your throat.  Nausea.  Bloating.  Dizziness.  Tiredness (fatigue). Follow these instructions at home:   Take over-the-counter and prescription medicines only as told by your health care provider.  Do not drive for 24 hours if you were given a medicine to help you relax (sedative) during your procedure. Have someone stay with you for 24 hours after the procedure.  Return to your normal activities as told by your health care provider. Ask your health care provider what activities are safe for you.  Return to eating what you normally do as soon as you feel well enough or as told by your health care provider.  Keep all follow-up visits as told by your health care provider. This is important. Contact a health care provider if:  You have pain in your abdomen that does not get better with medicine.  You develop signs of infection, such as: ? Chills. ? Feeling unwell. Get help right away if:  You have difficulty swallowing.  You have worsening pain in your throat, chest, or abdomen.  You vomit bright red blood or a substance that looks like coffee grounds.  You have bloody or very black stools.  You have a fever.  You have a sudden increase in swelling (bloating) in your abdomen. Summary  After the procedure, it is common to feel tired and to have some discomfort in your throat.  Contact your health care provider if you have signs of  infection--such as chills or feeling unwell--or if you have pain that does not improve with medicine.  Get help right away if you have trouble swallowing, worsening pain, bloody or black vomit, bloody or black stools, a fever, or increased swelling in your abdomen.  Keep all follow-up visits as told by your health care provider. This is important. This information is not intended to replace advice given to you by your health care provider. Make sure you discuss any questions you have with your health care provider. Document Revised: 02/24/2017 Document Reviewed: 02/01/2016 Elsevier Patient Education  2020 [employer].

## 2020-02-06 NOTE — Interval H&P Note (Signed)
Patient says he is feeling fine.  He is not having abdominal pain since he was seen in the office.  He also denies chest pain or dyspnea at rest.  He feels exertional dyspnea has not changed. His examination is unchanged. LFTs are normal except bilirubin is 1.6 and indirect is 1.3.  Anemia Gilbert's syndrome.  Transaminases and alkaline phosphatase are normal. Patient is agreeable to proceed with procedure.  History and Physical Interval Note:  02/06/2020 1:12 PM  Daniel Short  has presented today for surgery, with the diagnosis of dilated CBD.  The various methods of treatment have been discussed with the patient and family. After consideration of risks, benefits and other options for treatment, the patient has consented to  Procedure(s) with comments: ENDOSCOPIC RETROGRADE CHOLANGIOPANCREATOGRAPHY (ERCP) (N/A) - 1:15 SPHINCTEROTOMY (N/A) as a surgical intervention.  The patient's history has been reviewed, patient examined, no change in status, stable for surgery.  I have reviewed the patient's chart and labs.  Questions were answered to the patient's satisfaction.     [employer]

## 2020-02-06 NOTE — Anesthesia Preprocedure Evaluation (Addendum)
Anesthesia Evaluation  Patient identified by MRN, date of birth, ID band Patient awake    Reviewed: Allergy & Precautions, NPO status , Patient's Chart, lab work & pertinent test results  History of Anesthesia Complications Negative for: history of anesthetic complications  Airway Mallampati: II  TM Distance: >3 FB Neck ROM: Full    Dental  (+) Dental Advisory Given, Loose, Chipped, Edentulous Upper,  Very loose teeth, risk of loosing teeth and aspiration of loose teeth was explained:   Pulmonary shortness of breath and with exertion, former smoker,    Pulmonary exam normal breath sounds clear to auscultation       Cardiovascular Exercise Tolerance: Poor hypertension, Pt. on medications + CAD, + Past MI and + Cardiac Stents  Normal cardiovascular exam Rhythm:Regular Rate:Normal   Stress test - 2018 The left ventricular ejection fraction is mildly decreased (45-54%).  Nuclear stress EF: 50%.  There was no ST segment deviation noted during stress.  Findings consistent with prior myocardial infarction.  This is a low risk study.   Small area of mid inferior wall infarct no ischemia EF estimated at 50% no discrete RWMA    Neuro/Psych  Headaches, CVA negative psych ROS   GI/Hepatic negative GI ROS, Neg liver ROS,   Endo/Other  diabetes, Well Controlled, Type 2, Oral Hypoglycemic Agents  Renal/GU negative Renal ROS     Musculoskeletal  (+) Arthritis  (back pain),   Abdominal   Peds  Hematology negative hematology ROS (+)   Anesthesia Other Findings   Reproductive/Obstetrics negative OB ROS                            Anesthesia Physical Anesthesia Plan  ASA: III  Anesthesia Plan: General   Post-op Pain Management:    Induction: Intravenous  PONV Risk Score and Plan: 3 and Ondansetron  Airway Management Planned: Oral ETT  Additional Equipment:   Intra-op Plan:    Post-operative Plan: Extubation in OR  Informed Consent: I have reviewed the patients History and Physical, chart, labs and discussed the procedure including the risks, benefits and alternatives for the proposed anesthesia with the patient or authorized representative who has indicated his/her understanding and acceptance.     Dental advisory given  Plan Discussed with: CRNA and Surgeon  Anesthesia Plan Comments: (Very loose teeth, risk of loosing teeth and aspiration of loose teeth was explained)        Anesthesia Quick Evaluation

## 2020-02-06 NOTE — Progress Notes (Signed)
Brief ERCP note.  Antral gastritis and bulbar duodenitis. Normal ampulla of Vater located inside a shallow diverticulum. CBD easily cannulated and filled with contrast.  Mildly dilated with filling defects in CBD. Biliary sphincterotomy performed. 1 stone was removed with balloon and large stone was removed with Dormia basket.  Third stone could not be removed with basket and a balloon. Therefore 10 French 7 cm plastic stent left in place for biliary decompression. Patient tolerated the procedure well. PD was not cannulated or filled with contrast. Patient tolerated the procedure well.

## 2020-02-06 NOTE — Anesthesia Postprocedure Evaluation (Signed)
Anesthesia Post Note  Patient: JOSEY FORCIER  Procedure(s) Performed: ENDOSCOPIC RETROGRADE CHOLANGIOPANCREATOGRAPHY (ERCP) with stone extraction (N/A Esophagus) SPHINCTEROTOMY (N/A Esophagus) BILIARY STENT PLACEMENT (N/A Esophagus)  Patient location during evaluation: PACU Anesthesia Type: General Level of consciousness: awake and alert, oriented and patient cooperative Pain management: pain level controlled Vital Signs Assessment: post-procedure vital signs reviewed and stable Respiratory status: spontaneous breathing, nonlabored ventilation and respiratory function stable Cardiovascular status: blood pressure returned to baseline and stable Postop Assessment: no headache, no backache and no apparent nausea or vomiting Anesthetic complications: no   No complications documented.   Last Vitals:  Vitals:   02/06/20 1211  BP: (!) 156/83  Pulse: 74  Resp: 17  Temp: 36.8 C  SpO2: 95%    Last Pain:  Vitals:   02/06/20 1211  TempSrc: Oral  PainSc: 0-No pain                 [place] Wilson

## 2020-02-10 ENCOUNTER — Ambulatory Visit: Payer: Medicare HMO | Admitting: Gastroenterology

## 2020-02-10 ENCOUNTER — Encounter (HOSPITAL_COMMUNITY): Payer: Self-pay | Admitting: Internal Medicine

## 2020-02-18 ENCOUNTER — Telehealth: Payer: Self-pay

## 2020-02-18 NOTE — Telephone Encounter (Signed)
First available appt with Evelina Dun, FNP 03/10/20.  Appt made

## 2020-02-18 NOTE — Telephone Encounter (Signed)
Pt had gallstone surgery on 02/06/20 and needs to make a follow up appt to see Christy. [facility] does not have any openings until 03/10/20. Wife wants to know if it is ok for pt to wait that long to be seen on can [facility] work him in to be seen sooner?  Please call Beulah @ [phone]

## 2020-03-10 ENCOUNTER — Other Ambulatory Visit: Payer: Self-pay

## 2020-03-10 ENCOUNTER — Ambulatory Visit (INDEPENDENT_AMBULATORY_CARE_PROVIDER_SITE_OTHER): Payer: Medicare HMO | Admitting: Family

## 2020-03-10 ENCOUNTER — Encounter: Payer: Self-pay | Admitting: Family

## 2020-03-10 VITALS — BP 112/62 | HR 82 | Temp 97.2°F | Ht 67.5 in | Wt 200.2 lb

## 2020-03-10 DIAGNOSIS — Z09 Encounter for follow-up examination after completed treatment for conditions other than malignant neoplasm: Secondary | ICD-10-CM

## 2020-03-10 DIAGNOSIS — K8050 Calculus of bile duct without cholangitis or cholecystitis without obstruction: Secondary | ICD-10-CM | POA: Diagnosis not present

## 2020-03-10 DIAGNOSIS — M1712 Unilateral primary osteoarthritis, left knee: Secondary | ICD-10-CM | POA: Diagnosis not present

## 2020-03-10 NOTE — Patient Instructions (Signed)

## 2020-03-10 NOTE — Progress Notes (Signed)
Subjective:    Patient ID: Daniel Short, male    DOB: Aug 07, 1945, 74 y.o.   MRN: [id number]  Chief Complaint  Patient presents with  . Hospitalization Follow-up    For gallstones     HPI PT presents to the office today for hospital follow up. He had ERCP with stone extraction on 02/06/20. He reports he is doing well.   He currently denies pain in his abdomen. He reports he has osteoarthritis in hie left knee of 7 out 10. He takes Norco for this that helps.   Denies any fever, N&V.   Review of Systems  All other systems reviewed and are negative.      Objective:   Physical Exam Vitals reviewed.  Constitutional:      General: He is not in acute distress.    Appearance: He is well-developed and well-nourished.  HENT:     Head: Normocephalic.     Mouth/Throat:     Mouth: Oropharynx is clear and moist.  Eyes:     General:        Right eye: No discharge.        Left eye: No discharge.     Pupils: Pupils are equal, round, and reactive to light.  Neck:     Thyroid: No thyromegaly.  Cardiovascular:     Rate and Rhythm: Normal rate and regular rhythm.     Pulses: Intact distal pulses.     Heart sounds: Normal heart sounds. No murmur heard.   Pulmonary:     Effort: Pulmonary effort is normal. No respiratory distress.     Breath sounds: Normal breath sounds. No wheezing.  Abdominal:     General: Bowel sounds are normal. There is no distension.     Palpations: Abdomen is soft.     Tenderness: There is no abdominal tenderness.  Musculoskeletal:        General: Tenderness (left medial knee pain with flexion and extension) present. No edema. Normal range of motion.     Cervical back: Normal range of motion and neck supple.  Skin:    General: Skin is warm and dry.     Findings: No erythema or rash.  Neurological:     Mental Status: He is alert and oriented to person, place, and time.     Cranial Nerves: No cranial nerve deficit.     Deep Tendon Reflexes: Reflexes are  normal and symmetric.  Psychiatric:        Mood and Affect: Mood and affect normal.        Behavior: Behavior normal.        Thought Content: Thought content normal.        Judgment: Judgment normal.     BP 112/62   Pulse 82   Temp (!) 97.2 F (36.2 C) (Temporal)   Ht 5' 7.5" (1.715 m)   Wt 200 lb 3.2 oz (90.8 kg)   SpO2 97%   BMI 30.89 kg/m        Assessment & Plan:  Daniel Short comes in today with chief complaint of Hospitalization Follow-up (For gallstones )   Diagnosis and orders addressed:  1. Hospital discharge follow-up - BMP8+EGFR - Hepatic function panel  2. Primary osteoarthritis of left knee - BMP8+EGFR - Hepatic function panel  3. Choledocholithiasis - BMP8+EGFR - Hepatic function panel  4. Morbid obesity ([place]) - BMP8+EGFR - Hepatic function panel   Labs pending Health Maintenance reviewed Diet and exercise encouraged Hospital notes reviewed  Evelina Dun, FNP

## 2020-03-11 LAB — HEPATIC FUNCTION PANEL
ALT: 20 IU/L (ref 0–44)
AST: 29 IU/L (ref 0–40)
Albumin: 4.3 g/dL (ref 3.7–4.7)
Alkaline Phosphatase: 84 IU/L (ref 44–121)
Bilirubin Total: 1.8 mg/dL — ABNORMAL HIGH (ref 0.0–1.2)
Bilirubin, Direct: 0.38 mg/dL (ref 0.00–0.40)
Total Protein: 6.9 g/dL (ref 6.0–8.5)

## 2020-03-11 LAB — BMP8+EGFR
BUN/Creatinine Ratio: 8 — ABNORMAL LOW (ref 10–24)
BUN: 8 mg/dL (ref 8–27)
CO2: 24 mmol/L (ref 20–29)
Calcium: 9.8 mg/dL (ref 8.6–10.2)
Chloride: 100 mmol/L (ref 96–106)
Creatinine, Ser: 1.06 mg/dL (ref 0.76–1.27)
GFR calc Af Amer: 79 mL/min/{1.73_m2} (ref >59)
GFR calc non Af Amer: 68 mL/min/{1.73_m2} (ref >59)
Glucose: 81 mg/dL (ref 65–99)
Potassium: 4.8 mmol/L (ref 3.5–5.2)
Sodium: 138 mmol/L (ref 134–144)

## 2020-03-16 ENCOUNTER — Other Ambulatory Visit: Payer: Self-pay | Admitting: Family

## 2020-03-31 DIAGNOSIS — Z79899 Other long term (current) drug therapy: Secondary | ICD-10-CM | POA: Diagnosis not present

## 2020-03-31 DIAGNOSIS — M545 Low back pain, unspecified: Secondary | ICD-10-CM | POA: Diagnosis not present

## 2020-03-31 DIAGNOSIS — Z9189 Other specified personal risk factors, not elsewhere classified: Secondary | ICD-10-CM | POA: Diagnosis not present

## 2020-03-31 DIAGNOSIS — G8929 Other chronic pain: Secondary | ICD-10-CM | POA: Diagnosis not present

## 2020-05-04 ENCOUNTER — Telehealth (INDEPENDENT_AMBULATORY_CARE_PROVIDER_SITE_OTHER): Payer: Medicare HMO | Admitting: Gastroenterology

## 2020-05-11 ENCOUNTER — Encounter (INDEPENDENT_AMBULATORY_CARE_PROVIDER_SITE_OTHER): Payer: Self-pay | Admitting: Gastroenterology

## 2020-05-11 ENCOUNTER — Telehealth (INDEPENDENT_AMBULATORY_CARE_PROVIDER_SITE_OTHER): Payer: Medicare HMO | Admitting: Gastroenterology

## 2020-05-11 ENCOUNTER — Other Ambulatory Visit: Payer: Self-pay

## 2020-05-11 VITALS — Ht 67.5 in | Wt 200.0 lb

## 2020-05-11 DIAGNOSIS — K8050 Calculus of bile duct without cholangitis or cholecystitis without obstruction: Secondary | ICD-10-CM | POA: Diagnosis not present

## 2020-05-11 NOTE — Progress Notes (Signed)
Maylon Peppers, M.D. Gastroenterology & Hepatology [facility] [address] [place], [place] [address] Primary Care Physician: Sharion Balloon, [place] [address]  This is a telephone virtual visit.  It required patient-provider interaction for the medical decision making as documented below. The patient has consented and agreed to proceed with a Telehealth encounter given the current Coronavirus pandemic.  VIRTUAL VISIT NOTE Patient location: home Provider location: office  I will communicate my assessment and recommendations to the referring MD via EMR.  Problems: 1. Choledocholithiasis 2. F2/F3 fibrosis  History of Present Illness: Daniel Short is a 75 y.o. male with past medical history of coronary artery disease status post stent placement, diabetes, stroke, symptomatic choledocholithiasis, history MI, who presents for follow up of choledocholithiasis.  The patient was last seen on 01/23/2020. At that time, the patient was ordered to proceed with an ERCP given the presence of biliary duct stones.  He underwent an ERCP on 02/06/2020, was found to have a dilated CBD with multiple filling defects compatible with biliary stones.  I biliary sphincterotomy was performed and 2 stones were removed with debris, however there was a large stone that could not be removed.  10 French 7 centimeters plastic biliary stent was placed for decompression.  Patient denies having any complaints.  He states he has been feeling fine and denies having any symptoms since his ERCP was performed.The patient denies having any nausea, vomiting, fever, chills, hematochezia, melena, hematemesis, abdominal distention, abdominal pain, diarrhea, jaundice, pruritus or weight loss.  Most recent labs were from 03/10/2020 which showed an AST of 29, ALT of 20, albumin 1.8 mildly increased, alkaline phosphatase 84.  The patient has not  been seen by general surgery yet for cholecystectomy.  Past Medical History: Past Medical History:  Diagnosis Date  . Arthritis   . CAD (coronary artery disease)    Status post proximal/ostial RCA Zeta stents 2003  . Diabetes mellitus without complication ([place])   . Dyspnea   . Ear infection 1979   Right ear due to wax build up  . Headache   . History of stroke 2000   Intracerebral hemorrhage  . Myocardial infarction [facility]) 2003    Past Surgical History: Past Surgical History:  Procedure Laterality Date  . BILIARY STENT PLACEMENT N/A 02/06/2020   Procedure: BILIARY STENT PLACEMENT;  Surgeon: Rogene Houston, MD;  Location: [facility];  Service: Endoscopy;  Laterality: N/A;  . CARPAL TUNNEL RELEASE    . CORONARY STENT PLACEMENT  2005  . ERCP N/A 02/06/2020   Procedure: ENDOSCOPIC RETROGRADE CHOLANGIOPANCREATOGRAPHY (ERCP) with stone extraction;  Surgeon: Rogene Houston, MD;  Location: [facility];  Service: Endoscopy;  Laterality: N/A;  1:15  . HERNIA REPAIR    . KNEE ARTHROSCOPY Right   . LUMBAR LAMINECTOMY/DECOMPRESSION MICRODISCECTOMY N/A 09/14/2016   Procedure: Decompression L3-5, insitu fusion L4-5;  Surgeon: Melina Schools, MD;  Location: [facility];  Service: Orthopedics;  Laterality: N/A;  3 hrs  . LUMBAR LAMINECTOMY/DECOMPRESSION MICRODISCECTOMY N/A 09/17/2016   Procedure: WOUND EXPLORATION, GILL DECOMPRESSION,FAR LATERAL DISCECTOMY LEFT L4-L5, AND EVACUATION OF HEMATOMA;  Surgeon: Melina Schools, MD;  Location: [facility];  Service: Orthopedics;  Laterality: N/A;  . SPHINCTEROTOMY N/A 02/06/2020   Procedure: SPHINCTEROTOMY;  Surgeon: Rogene Houston, MD;  Location: [facility];  Service: Endoscopy;  Laterality: N/A;    Family History: Family History  Problem Relation Age of Onset  . Heart attack Mother   .  Heart attack Father   . COPD Father   . Heart attack Brother   . Colon cancer Neg Hx     Social History: Social History   Tobacco Use  Smoking Status Former Smoker  .  Packs/day: 0.25  . Years: 54.00  . Pack years: 13.50  . Types: Cigarettes  . Quit date: 08/27/2015  . Years since quitting: 4.7  Smokeless Tobacco Never Used   Social History   Substance and Sexual Activity  Alcohol Use No   Social History   Substance and Sexual Activity  Drug Use No    Allergies: No Known Allergies  Medications: Current Outpatient Medications  Medication Sig Dispense Refill  . allopurinol (ZYLOPRIM) 100 MG tablet Take 1 tablet (100 mg total) by mouth daily. (Needs to be seen before next refill) 90 tablet 3  . atorvastatin (LIPITOR) 20 MG tablet Take 1 tablet (20 mg total) by mouth daily. 90 tablet 3  . blood glucose meter kit and supplies Dispense based on patient and insurance preference. Use up to four times daily as directed. (FOR ICD-10 E10.9, E11.9). 1 each 0  . Blood Glucose Monitoring Suppl ([place]) w/Device KIT Use to test blood sugar daily as directed. DX: E11.65 1 kit 1  . glucose blood (ONETOUCH VERIO) test strip Use to test blood sugar daily as directed. DX: E11.65 100 each 12  . HYDROcodone-acetaminophen (NORCO/VICODIN) 5-325 MG tablet Take 1 tablet by mouth every 6 (six) hours as needed for moderate pain.     Marland Kitchen lisinopril (ZESTRIL) 20 MG tablet Take 1 tablet (20 mg total) by mouth daily. (Needs to be seen before next refill) 90 tablet 3  . metFORMIN (GLUCOPHAGE-XR) 500 MG 24 hr tablet Take 1 tablet (500 mg total) by mouth daily with breakfast. (Needs to be seen before next refill) 90 tablet 3  . OneTouch Delica Lancets 25O MISC Use to test blood sugar daily as directed. DX: E11.65 100 each 11  . Polyethyl Glycol-Propyl Glycol (SYSTANE OP) Place 1 drop into both eyes 2 (two) times daily as needed (dry eyes).     No current facility-administered medications for this visit.    Review of Systems: GENERAL: negative for malaise, night sweats HEENT: No changes in hearing or vision, no nose bleeds or other nasal problems. NECK:  Negative for lumps, goiter, pain and significant neck swelling RESPIRATORY: Negative for cough, wheezing CARDIOVASCULAR: Negative for chest pain, leg swelling, palpitations, orthopnea GI: SEE HPI MUSCULOSKELETAL: Negative for joint pain or swelling, back pain, and muscle pain. SKIN: Negative for lesions, rash PSYCH: Negative for sleep disturbance, mood disorder and recent psychosocial stressors. HEMATOLOGY Negative for prolonged bleeding, bruising easily, and swollen nodes. ENDOCRINE: Negative for cold or heat intolerance, polyuria, polydipsia and goiter. NEURO: negative for tremor, gait imbalance, syncope and seizures. The remainder of the review of systems is noncontributory.   Physical Exam: No exam was performed as this was a telephone encounter  Imaging/Labs: as above  I personally reviewed and interpreted the available labs, imaging and endoscopic files.  Impression and Plan: Daniel Short is a 75 y.o. male with past medical history of coronary artery disease status post stent placement, diabetes, stroke, symptomatic choledocholithiasis, history MI, who presents for follow up of choledocholithiasis.  The patient had a successful ERCP with extraction of 2 stones but 1 stone remained in his biliary ducts.  He has been asymptomatic since the procedure was performed.  I explained to the patient that he will need to  have removal of his plastic stent with reevaluation of his biliary tree to assess if there was a decrease in size of the initial remaining stone or if this has resolved.  I will request Dr. Laural Golden to review his case to repeat an endoscopic retrograde cholangiopancreatography.  In the meantime, I will refer his case to general surgery for evaluation of cholecystectomy.  Both the patient and the wife understood and agreed.  - Case discussed with Dr. Laural Golden, he will call the patient to discuss ERCP planning - General surgery referral  All questions were answered.      Total  visit time: I spent a total of 30 minutes  Maylon Peppers, MD Gastroenterology and Hepatology [facility]

## 2020-05-11 NOTE — Patient Instructions (Addendum)
Patient will be reached by Dr. Laural Golden to discuss plan for ERCP for stent removal and evaluation of biliary tree.

## 2020-05-12 ENCOUNTER — Telehealth (INDEPENDENT_AMBULATORY_CARE_PROVIDER_SITE_OTHER): Payer: Self-pay | Admitting: *Deleted

## 2020-05-12 ENCOUNTER — Telehealth (INDEPENDENT_AMBULATORY_CARE_PROVIDER_SITE_OTHER): Payer: Self-pay | Admitting: Gastroenterology

## 2020-05-12 DIAGNOSIS — Z0100 Encounter for examination of eyes and vision without abnormal findings: Secondary | ICD-10-CM | POA: Diagnosis not present

## 2020-05-12 DIAGNOSIS — H5200 Hypermetropia, unspecified eye: Secondary | ICD-10-CM | POA: Diagnosis not present

## 2020-05-12 NOTE — Telephone Encounter (Signed)
Can we refer him to [facility] surgery? Thanks

## 2020-05-12 NOTE — Telephone Encounter (Signed)
Spoke to Daniel Short at [facility] - she called patient to schedule OV for consultation - patient's wife was not nice, Ms Buda told Daniel Short she didn't know why they were calling them "that they were not at that place yet" and they did not want to see a surgeon in [place] anyway - please advise

## 2020-05-12 NOTE — Telephone Encounter (Signed)
Patients wife left a very rude message about the patient having surgery - states no one has bothered to discuss the surgery and they have already got a phone call from the surgeon - please advise - ph# [phone]

## 2020-05-12 NOTE — Telephone Encounter (Signed)
I had a thorough discussion with the wife and the patient today regarding the referral for cholecystectomy by general surgery.  The patient and the wife had multiple questions.  I told him that we had already discussed the same questions yesterday but I went into detail again regarding why he will require a cholecystectomy to avoid any further episodes of choledocholithiasis and complications such as pancreatitis.  Based on the discussion I had with Dr. Laural Golden the timing of his endoscopic retrograde cholangiopancreatography may also depend on what general surgery decides for his cholecystectomy as the patient had presence of a previous stent that could not be removed and if he undergoes cholecystectomy with presence of a persistent obstruction without a stent helping drainage, there is a high risk of postoperative leak.  I explained this to the wife thoroughly.  Finally, the patient stated that he would like to be referred to [facility] surgery for this as he has had most of his care at [facility].  He also reported that he would like to get a call back from Dr. Laural Golden regarding the endoscopic retrograde cholangiopancreatography plan.  Ann, can you please send the referral to [facility]?  Thanks,  Maylon Peppers, MD Gastroenterology and Hepatology [facility]

## 2020-05-12 NOTE — Telephone Encounter (Signed)
I called patient.  I talk with him and his wife. He has been referred to [place] surgery for cholecystectomy. Will proceed with ERCP to remove stent and residual stones. Please schedule next Wednesday or Wednesday day after next  He want to schedule after 12 noon

## 2020-05-13 ENCOUNTER — Other Ambulatory Visit (INDEPENDENT_AMBULATORY_CARE_PROVIDER_SITE_OTHER): Payer: Self-pay

## 2020-05-13 ENCOUNTER — Encounter (INDEPENDENT_AMBULATORY_CARE_PROVIDER_SITE_OTHER): Payer: Self-pay

## 2020-05-13 DIAGNOSIS — K8050 Calculus of bile duct without cholangitis or cholecystitis without obstruction: Secondary | ICD-10-CM

## 2020-05-13 NOTE — Telephone Encounter (Signed)
Referral sent to [facility] 

## 2020-05-13 NOTE — Telephone Encounter (Addendum)
ERCP is scheduled on 05/27/20 at 12:45  [name] is aware

## 2020-05-13 NOTE — Telephone Encounter (Signed)
Referral sent to [facility], they will contact patient with apt ?

## 2020-05-15 ENCOUNTER — Telehealth: Payer: Self-pay | Admitting: Family

## 2020-05-15 DIAGNOSIS — H269 Unspecified cataract: Secondary | ICD-10-CM

## 2020-05-15 NOTE — Telephone Encounter (Signed)
Reason for Referral: cataracts  Referral discussed with patient: yes Best contact number of patient for referral team: [phone]   Has patient been seen by a specialist for this issue before: no Patient provider preference for referral: [facility]  Patient location preference for referral: [place] PHONE [phone] [facility]AX [phone]

## 2020-05-15 NOTE — Telephone Encounter (Signed)
Referral placed.

## 2020-05-19 ENCOUNTER — Telehealth: Payer: Self-pay

## 2020-05-22 NOTE — Patient Instructions (Signed)
Daniel Short  05/22/2020     @PREFPERIOPPHARMACY @   Your procedure is scheduled on  05/28/2019.   Report to [facility] at  1130  A.M.   Call this number if you have problems the morning of surgery:  [phone]   Remember:  Follow the diet instructions given to you by the office.                    Take these medicines the morning of surgery with A SIP OF WATER  Hydrocodone (if needed).    Please brush your teeth.  Do not wear jewelry, make-up or nail polish.  Do not wear lotions, powders, or perfumes, or deodorant.  Do not shave 48 hours prior to surgery.  Men may shave face and neck.  Do not bring valuables to the hospital.  [facility] is not responsible for any belongings or valuables.  Contacts, dentures or bridgework may not be worn into surgery.  Leave your suitcase in the car.  After surgery it may be brought to your room.  For patients admitted to the hospital, discharge time will be determined by your treatment team.  Patients discharged the day of surgery will not be allowed to drive home and must have someone with them for 24 hours.   Special instructions:   DO NOT smoke tobacco or vape the morning of your procedure.  Please read over the following fact sheets that you were given. Anesthesia Post-op Instructions and Care and Recovery After Surgery       Endoscopic Retrograde Cholangiopancreatogram, Care After This sheet gives you information about how to care for yourself after your procedure. Your health care provider may also give you more specific instructions. If you have problems or questions, contact your health care provider. What can I expect after the procedure? After the procedure, it is common to have:  Soreness in your throat.  Nausea.  Bloating.  Dizziness.  Tiredness (fatigue). Follow these instructions at home:  Take over-the-counter and prescription medicines only as told by your health care provider.  If you were  prescribed an antibiotic medicine, take it as told by your health care provider. Do not stop using the antibiotic even if you start to feel better.  If you were given a sedative during the procedure, it can affect you for several hours. Do not drive or operate machinery until your health care provider says that it is safe.  Have someone stay with you for 24 hours after the procedure.  Return to your normal activities as told by your health care provider. Ask your health care provider what activities are safe for you.  Return to eating what you normally do as soon as you feel well enough or as told by your health care provider.  Keep all follow-up visits as told by your health care provider. This is important.   Contact a health care provider if you:  Have pain in your abdomen that does not get better with medicine.  Develop signs of infection, such as: ? Chills or fever. ? Feeling unwell. Get help right away if you:  Have difficulty swallowing.  Have worsening pain in your throat, chest, or abdomen.  Vomit bright red blood or a substance that looks like coffee grounds.  Have bloody or black, tarry stools.  Have a fever.  Have a sudden increase in swelling (bloating) in your abdomen. Summary  After the procedure, it is common to feel  tired, and to have some discomfort in your throat or some bloating of your abdomen.  If you were given a sedative during the procedure, it can affect you for several hours. Do not drive or operate machinery until your health care provider says that it is safe. Have someone stay with you for 24 hours after the procedure.  Contact your health care provider if you have signs of infection, such as chills, fever, feeling unwell, or if you have pain that does not improve with medicine.  Get help right away if you have trouble swallowing, worsening pain, bloody or black vomit, bloody or black stools, a fever, or increased swelling in your abdomen. This  information is not intended to replace advice given to you by your health care provider. Make sure you discuss any questions you have with your health care provider. Document Revised: 11/27/2018 Document Reviewed: 11/27/2018 Elsevier Patient Education  2021 [place] Anesthesia, Adult, Care After This sheet gives you information about how to care for yourself after your procedure. Your health care provider may also give you more specific instructions. If you have problems or questions, contact your health care provider. What can I expect after the procedure? After the procedure, the following side effects are common:  Pain or discomfort at the IV site.  Nausea.  Vomiting.  Sore throat.  Trouble concentrating.  Feeling cold or chills.  Feeling weak or tired.  Sleepiness and fatigue.  Soreness and body aches. These side effects can affect parts of the body that were not involved in surgery. Follow these instructions at home: For the time period you were told by your health care provider:  Rest.  Do not participate in activities where you could fall or become injured.  Do not drive or use machinery.  Do not drink alcohol.  Do not take sleeping pills or medicines that cause drowsiness.  Do not make important decisions or sign legal documents.  Do not take care of children on your own.   Eating and drinking  Follow any instructions from your health care provider about eating or drinking restrictions.  When you feel hungry, start by eating small amounts of foods that are soft and easy to digest (bland), such as toast. Gradually return to your regular diet.  Drink enough fluid to keep your urine pale yellow.  If you vomit, rehydrate by drinking water, juice, or clear broth. General instructions  If you have sleep apnea, surgery and certain medicines can increase your risk for breathing problems. Follow instructions from your health care provider about wearing  your sleep device: ? Anytime you are sleeping, including during daytime naps. ? While taking prescription pain medicines, sleeping medicines, or medicines that make you drowsy.  Have a responsible adult stay with you for the time you are told. It is important to have someone help care for you until you are awake and alert.  Return to your normal activities as told by your health care provider. Ask your health care provider what activities are safe for you.  Take over-the-counter and prescription medicines only as told by your health care provider.  If you smoke, do not smoke without supervision.  Keep all follow-up visits as told by your health care provider. This is important. Contact a health care provider if:  You have nausea or vomiting that does not get better with medicine.  You cannot eat or drink without vomiting.  You have pain that does not get better with medicine.  You  are unable to pass urine.  You develop a skin rash.  You have a fever.  You have redness around your IV site that gets worse. Get help right away if:  You have difficulty breathing.  You have chest pain.  You have blood in your urine or stool, or you vomit blood. Summary  After the procedure, it is common to have a sore throat or nausea. It is also common to feel tired.  Have a responsible adult stay with you for the time you are told. It is important to have someone help care for you until you are awake and alert.  When you feel hungry, start by eating small amounts of foods that are soft and easy to digest (bland), such as toast. Gradually return to your regular diet.  Drink enough fluid to keep your urine pale yellow.  Return to your normal activities as told by your health care provider. Ask your health care provider what activities are safe for you. This information is not intended to replace advice given to you by your health care provider. Make sure you discuss any questions you have  with your health care provider. Document Revised: 11/28/2019 Document Reviewed: 06/27/2019 Elsevier Patient Education  2021 [employer].

## 2020-05-25 ENCOUNTER — Encounter (HOSPITAL_COMMUNITY): Payer: Self-pay

## 2020-05-25 ENCOUNTER — Other Ambulatory Visit (HOSPITAL_COMMUNITY)
Admission: RE | Admit: 2020-05-25 | Discharge: 2020-05-25 | Disposition: A | Discharge status: Home / Self Care | Payer: Medicare HMO | Source: Ambulatory Visit | Attending: Internal Medicine | Admitting: Internal Medicine

## 2020-05-25 ENCOUNTER — Other Ambulatory Visit: Payer: Self-pay

## 2020-05-25 ENCOUNTER — Encounter (HOSPITAL_COMMUNITY)
Admission: RE | Admit: 2020-05-25 | Discharge: 2020-05-25 | Disposition: A | Discharge status: Home / Self Care | Payer: Medicare HMO | Source: Ambulatory Visit | Attending: Internal Medicine | Admitting: Internal Medicine

## 2020-05-25 DIAGNOSIS — Z20822 Contact with and (suspected) exposure to covid-19: Secondary | ICD-10-CM | POA: Diagnosis not present

## 2020-05-25 DIAGNOSIS — K8050 Calculus of bile duct without cholangitis or cholecystitis without obstruction: Secondary | ICD-10-CM | POA: Insufficient documentation

## 2020-05-25 DIAGNOSIS — Z01812 Encounter for preprocedural laboratory examination: Secondary | ICD-10-CM | POA: Insufficient documentation

## 2020-05-25 HISTORY — DX: Other chronic pain: G89.29

## 2020-05-25 LAB — CBC WITH DIFFERENTIAL/PLATELET
Abs Immature Granulocytes: 0.03 10*3/uL (ref 0.00–0.07)
Basophils Absolute: 0.1 10*3/uL (ref 0.0–0.1)
Basophils Relative: 1 %
Eosinophils Absolute: 0.1 10*3/uL (ref 0.0–0.5)
Eosinophils Relative: 1 %
HCT: 47.1 % (ref 39.0–52.0)
Hemoglobin: 15.4 g/dL (ref 13.0–17.0)
Immature Granulocytes: 0 %
Lymphocytes Relative: 32 %
Lymphs Abs: 3.2 10*3/uL (ref 0.7–4.0)
MCH: 30.4 pg (ref 26.0–34.0)
MCHC: 32.7 g/dL (ref 30.0–36.0)
MCV: 92.9 fL (ref 80.0–100.0)
Monocytes Absolute: 1.2 10*3/uL — ABNORMAL HIGH (ref 0.1–1.0)
Monocytes Relative: 12 %
Neutro Abs: 5.3 10*3/uL (ref 1.7–7.7)
Neutrophils Relative %: 54 %
Platelets: 220 10*3/uL (ref 150–400)
RBC: 5.07 MIL/uL (ref 4.22–5.81)
RDW: 12.4 % (ref 11.5–15.5)
WBC: 9.9 10*3/uL (ref 4.0–10.5)
nRBC: 0 % (ref 0.0–0.2)

## 2020-05-25 LAB — COMPREHENSIVE METABOLIC PANEL
ALT: 35 U/L (ref 0–44)
AST: 36 U/L (ref 15–41)
Albumin: 4.3 g/dL (ref 3.5–5.0)
Alkaline Phosphatase: 60 U/L (ref 38–126)
Anion gap: 10 (ref 5–15)
BUN: 10 mg/dL (ref 8–23)
CO2: 26 mmol/L (ref 22–32)
Calcium: 9.4 mg/dL (ref 8.9–10.3)
Chloride: 102 mmol/L (ref 98–111)
Creatinine, Ser: 0.96 mg/dL (ref 0.61–1.24)
GFR, Estimated: >60 mL/min (ref >60)
Glucose, Bld: 90 mg/dL (ref 70–99)
Potassium: 4.5 mmol/L (ref 3.5–5.1)
Sodium: 138 mmol/L (ref 135–145)
Total Bilirubin: 1.9 mg/dL — ABNORMAL HIGH (ref 0.3–1.2)
Total Protein: 7.4 g/dL (ref 6.5–8.1)

## 2020-05-26 ENCOUNTER — Other Ambulatory Visit (INDEPENDENT_AMBULATORY_CARE_PROVIDER_SITE_OTHER): Payer: Self-pay | Admitting: Internal Medicine

## 2020-05-26 ENCOUNTER — Other Ambulatory Visit (HOSPITAL_COMMUNITY)
Admission: RE | Admit: 2020-05-26 | Discharge: 2020-05-26 | Disposition: A | Discharge status: Home / Self Care | Payer: Medicare HMO | Source: Ambulatory Visit | Attending: Internal Medicine | Admitting: Internal Medicine

## 2020-05-26 LAB — SARS CORONAVIRUS 2 (TAT 6-24 HRS): SARS Coronavirus 2: NEGATIVE

## 2020-05-26 LAB — BILIRUBIN, FRACTIONATED(TOT/DIR/INDIR)
Bilirubin, Direct: 0.1 mg/dL (ref 0.0–0.2)
Indirect Bilirubin: 1.4 mg/dL — ABNORMAL HIGH (ref 0.3–0.9)
Total Bilirubin: 1.5 mg/dL — ABNORMAL HIGH (ref 0.3–1.2)

## 2020-05-26 NOTE — Progress Notes (Signed)
Fractionate bili

## 2020-05-27 ENCOUNTER — Other Ambulatory Visit: Payer: Self-pay

## 2020-05-27 ENCOUNTER — Ambulatory Visit (HOSPITAL_COMMUNITY): Payer: Medicare HMO | Admitting: Anesthesiology

## 2020-05-27 ENCOUNTER — Ambulatory Visit (HOSPITAL_COMMUNITY): Payer: Medicare HMO

## 2020-05-27 ENCOUNTER — Encounter (HOSPITAL_COMMUNITY): Payer: Self-pay | Admitting: Internal Medicine

## 2020-05-27 ENCOUNTER — Encounter (HOSPITAL_COMMUNITY)
Admission: RE | Disposition: A | Discharge status: Home / Self Care | Payer: Self-pay | Source: Home / Self Care | Attending: Internal Medicine

## 2020-05-27 ENCOUNTER — Observation Stay (HOSPITAL_COMMUNITY)
Admission: RE | Admit: 2020-05-27 | Discharge: 2020-05-28 | Disposition: A | Discharge status: Home / Self Care | Payer: Medicare HMO | Attending: Internal Medicine | Admitting: Internal Medicine

## 2020-05-27 DIAGNOSIS — I4891 Unspecified atrial fibrillation: Secondary | ICD-10-CM | POA: Diagnosis not present

## 2020-05-27 DIAGNOSIS — I252 Old myocardial infarction: Secondary | ICD-10-CM | POA: Diagnosis not present

## 2020-05-27 DIAGNOSIS — I2510 Atherosclerotic heart disease of native coronary artery without angina pectoris: Secondary | ICD-10-CM | POA: Diagnosis not present

## 2020-05-27 DIAGNOSIS — E119 Type 2 diabetes mellitus without complications: Secondary | ICD-10-CM | POA: Diagnosis not present

## 2020-05-27 DIAGNOSIS — I10 Essential (primary) hypertension: Secondary | ICD-10-CM | POA: Insufficient documentation

## 2020-05-27 DIAGNOSIS — I152 Hypertension secondary to endocrine disorders: Secondary | ICD-10-CM | POA: Diagnosis present

## 2020-05-27 DIAGNOSIS — Z7984 Long term (current) use of oral hypoglycemic drugs: Secondary | ICD-10-CM | POA: Diagnosis not present

## 2020-05-27 DIAGNOSIS — Z8673 Personal history of transient ischemic attack (TIA), and cerebral infarction without residual deficits: Secondary | ICD-10-CM | POA: Insufficient documentation

## 2020-05-27 DIAGNOSIS — Z79899 Other long term (current) drug therapy: Secondary | ICD-10-CM | POA: Diagnosis not present

## 2020-05-27 DIAGNOSIS — Z4659 Encounter for fitting and adjustment of other gastrointestinal appliance and device: Principal | ICD-10-CM | POA: Insufficient documentation

## 2020-05-27 DIAGNOSIS — M109 Gout, unspecified: Secondary | ICD-10-CM | POA: Insufficient documentation

## 2020-05-27 DIAGNOSIS — K839 Disease of biliary tract, unspecified: Secondary | ICD-10-CM | POA: Diagnosis not present

## 2020-05-27 DIAGNOSIS — T85590A Other mechanical complication of bile duct prosthesis, initial encounter: Secondary | ICD-10-CM | POA: Diagnosis not present

## 2020-05-27 DIAGNOSIS — K8050 Calculus of bile duct without cholangitis or cholecystitis without obstruction: Secondary | ICD-10-CM

## 2020-05-27 DIAGNOSIS — Z87891 Personal history of nicotine dependence: Secondary | ICD-10-CM | POA: Diagnosis not present

## 2020-05-27 DIAGNOSIS — Z48815 Encounter for surgical aftercare following surgery on the digestive system: Secondary | ICD-10-CM | POA: Diagnosis not present

## 2020-05-27 HISTORY — PX: GASTROINTESTINAL STENT REMOVAL: SHX6384

## 2020-05-27 HISTORY — PX: ERCP: SHX5425

## 2020-05-27 LAB — GLUCOSE, CAPILLARY
Glucose-Capillary: 109 mg/dL — ABNORMAL HIGH (ref 70–99)
Glucose-Capillary: 182 mg/dL — ABNORMAL HIGH (ref 70–99)
Glucose-Capillary: 128 mg/dL — ABNORMAL HIGH (ref 70–99)
Glucose-Capillary: 130 mg/dL — ABNORMAL HIGH (ref 70–99)

## 2020-05-27 SURGERY — ERCP, WITH INTERVENTION IF INDICATED
Anesthesia: General

## 2020-05-27 MED ORDER — SODIUM CHLORIDE 0.9 % IV SOLN: 250.0000 mL | INTRAVENOUS | Status: DC | PRN

## 2020-05-27 MED ORDER — ALLOPURINOL 100 MG PO TABS
100.0000 mg | ORAL_TABLET | Freq: Every day | ORAL | Status: DC
Start: 2020-05-27 — End: 2020-05-28
  Administered 2020-05-27 – 2020-05-28 (×2): 100 mg via ORAL
  Filled 2020-05-27 (×2): qty 1

## 2020-05-27 MED ORDER — METOPROLOL TARTRATE 5 MG/5ML IV SOLN
INTRAVENOUS | Status: AC
  Filled 2020-05-27: qty 5

## 2020-05-27 MED ORDER — ORAL CARE MOUTH RINSE: 15.0000 mL | Freq: Once | OROMUCOSAL | Status: DC

## 2020-05-27 MED ORDER — CHLORHEXIDINE GLUCONATE 0.12 % MT SOLN: 15.0000 mL | Freq: Once | OROMUCOSAL | Status: DC

## 2020-05-27 MED ORDER — HYDROMORPHONE HCL 1 MG/ML IJ SOLN
0.2500 mg | INTRAMUSCULAR | Status: DC | PRN
  Administered 2020-05-27 (×2): 0.5 mg via INTRAVENOUS
  Filled 2020-05-27 (×2): qty 0.5

## 2020-05-27 MED ORDER — GLUCAGON HCL RDNA (DIAGNOSTIC) 1 MG IJ SOLR
INTRAMUSCULAR | Status: AC
  Filled 2020-05-27: qty 1

## 2020-05-27 MED ORDER — SODIUM CHLORIDE 0.9% FLUSH
3.0000 mL | Freq: Two times a day (BID) | INTRAVENOUS | Status: DC
  Administered 2020-05-27 – 2020-05-28 (×2): 3 mL via INTRAVENOUS

## 2020-05-27 MED ORDER — ROCURONIUM BROMIDE 10 MG/ML (PF) SYRINGE
PREFILLED_SYRINGE | INTRAVENOUS | Status: DC | PRN
  Administered 2020-05-27: 50 mg via INTRAVENOUS

## 2020-05-27 MED ORDER — STERILE WATER FOR IRRIGATION IR SOLN
Status: DC | PRN
  Administered 2020-05-27: 1000 mL

## 2020-05-27 MED ORDER — SUGAMMADEX SODIUM 500 MG/5ML IV SOLN
INTRAVENOUS | Status: DC | PRN
  Administered 2020-05-27: 200 mg via INTRAVENOUS

## 2020-05-27 MED ORDER — MEPERIDINE HCL 50 MG/ML IJ SOLN: 6.2500 mg | INTRAMUSCULAR | Status: DC | PRN

## 2020-05-27 MED ORDER — SUCCINYLCHOLINE CHLORIDE 200 MG/10ML IV SOSY
PREFILLED_SYRINGE | INTRAVENOUS | Status: AC
  Filled 2020-05-27: qty 10

## 2020-05-27 MED ORDER — ONDANSETRON HCL 4 MG/2ML IJ SOLN: 4.0000 mg | Freq: Once | INTRAMUSCULAR | Status: DC | PRN

## 2020-05-27 MED ORDER — SODIUM CHLORIDE 0.9 % IV SOLN
INTRAVENOUS | Status: AC
  Filled 2020-05-27: qty 50

## 2020-05-27 MED ORDER — PROPOFOL 10 MG/ML IV BOLUS
INTRAVENOUS | Status: DC | PRN
  Administered 2020-05-27: 180 mg via INTRAVENOUS

## 2020-05-27 MED ORDER — LIDOCAINE HCL (PF) 2 % IJ SOLN
INTRAMUSCULAR | Status: AC
  Filled 2020-05-27: qty 10

## 2020-05-27 MED ORDER — LACTATED RINGERS IV SOLN: INTRAVENOUS | Status: DC | PRN

## 2020-05-27 MED ORDER — CEFAZOLIN SODIUM-DEXTROSE 2-3 GM-%(50ML) IV SOLR
INTRAVENOUS | Status: DC | PRN
  Administered 2020-05-27: 2 g via INTRAVENOUS

## 2020-05-27 MED ORDER — CHLORHEXIDINE GLUCONATE CLOTH 2 % EX PADS: 6.0000 | MEDICATED_PAD | Freq: Once | CUTANEOUS | Status: DC

## 2020-05-27 MED ORDER — METFORMIN HCL ER 500 MG PO TB24
500.0000 mg | ORAL_TABLET | Freq: Every day | ORAL | Status: DC
  Administered 2020-05-28: 500 mg via ORAL
  Filled 2020-05-27: qty 1

## 2020-05-27 MED ORDER — LISINOPRIL 10 MG PO TABS
20.0000 mg | ORAL_TABLET | Freq: Every day | ORAL | Status: DC
Start: 2020-05-27 — End: 2020-05-28
  Administered 2020-05-27 – 2020-05-28 (×2): 20 mg via ORAL
  Filled 2020-05-27 (×2): qty 2

## 2020-05-27 MED ORDER — METOPROLOL TARTRATE 5 MG/5ML IV SOLN
INTRAVENOUS | Status: DC | PRN
  Administered 2020-05-27: 2 mg via INTRAVENOUS
  Administered 2020-05-27: 1 mg via INTRAVENOUS

## 2020-05-27 MED ORDER — DEXAMETHASONE SODIUM PHOSPHATE 10 MG/ML IJ SOLN
INTRAMUSCULAR | Status: AC
  Filled 2020-05-27: qty 1

## 2020-05-27 MED ORDER — ROCURONIUM BROMIDE 10 MG/ML (PF) SYRINGE
PREFILLED_SYRINGE | INTRAVENOUS | Status: AC
  Filled 2020-05-27: qty 10

## 2020-05-27 MED ORDER — APIXABAN 5 MG PO TABS
5.0000 mg | ORAL_TABLET | Freq: Two times a day (BID) | ORAL | Status: DC
  Administered 2020-05-28: 5 mg via ORAL
  Filled 2020-05-27 (×2): qty 1

## 2020-05-27 MED ORDER — PHENYLEPHRINE 40 MCG/ML (10ML) SYRINGE FOR IV PUSH (FOR BLOOD PRESSURE SUPPORT)
PREFILLED_SYRINGE | INTRAVENOUS | Status: AC
  Filled 2020-05-27: qty 10

## 2020-05-27 MED ORDER — LIDOCAINE HCL (CARDIAC) PF 100 MG/5ML IV SOSY
PREFILLED_SYRINGE | INTRAVENOUS | Status: DC | PRN
  Administered 2020-05-27: 75 mg via INTRATRACHEAL

## 2020-05-27 MED ORDER — INSULIN ASPART 100 UNIT/ML ~~LOC~~ SOLN: 0.0000 [IU] | Freq: Three times a day (TID) | SUBCUTANEOUS | Status: DC

## 2020-05-27 MED ORDER — HYDROCODONE-ACETAMINOPHEN 5-325 MG PO TABS
1.0000 | ORAL_TABLET | Freq: Four times a day (QID) | ORAL | Status: DC | PRN
Start: 2020-05-27 — End: 2020-05-28

## 2020-05-27 MED ORDER — PROPOFOL 10 MG/ML IV BOLUS
INTRAVENOUS | Status: AC
  Filled 2020-05-27: qty 20

## 2020-05-27 MED ORDER — SODIUM CHLORIDE 0.9 % IV SOLN
INTRAVENOUS | Status: DC | PRN
  Administered 2020-05-27: 30 mL

## 2020-05-27 MED ORDER — SODIUM CHLORIDE 0.9% FLUSH: 3.0000 mL | INTRAVENOUS | Status: DC | PRN

## 2020-05-27 MED ORDER — ATORVASTATIN CALCIUM 20 MG PO TABS
20.0000 mg | ORAL_TABLET | Freq: Every day | ORAL | Status: DC
  Administered 2020-05-27 – 2020-05-28 (×2): 20 mg via ORAL
  Filled 2020-05-27 (×2): qty 1

## 2020-05-27 MED ORDER — FENTANYL CITRATE (PF) 100 MCG/2ML IJ SOLN
INTRAMUSCULAR | Status: AC
  Filled 2020-05-27: qty 2

## 2020-05-27 MED ORDER — ACETAMINOPHEN 325 MG PO TABS
650.0000 mg | ORAL_TABLET | ORAL | Status: DC | PRN
  Filled 2020-05-27: qty 2

## 2020-05-27 MED ORDER — ONDANSETRON HCL 4 MG/2ML IJ SOLN
INTRAMUSCULAR | Status: DC | PRN
  Administered 2020-05-27: 4 mg via INTRAVENOUS

## 2020-05-27 MED ORDER — ONDANSETRON HCL 4 MG/2ML IJ SOLN: 4.0000 mg | Freq: Four times a day (QID) | INTRAMUSCULAR | Status: DC | PRN

## 2020-05-27 MED ORDER — GLUCAGON HCL RDNA (DIAGNOSTIC) 1 MG IJ SOLR
INTRAMUSCULAR | Status: DC | PRN
  Administered 2020-05-27 (×3): .25 mg via INTRAVENOUS

## 2020-05-27 MED ORDER — CEFAZOLIN SODIUM-DEXTROSE 2-4 GM/100ML-% IV SOLN: 2.0000 g | INTRAVENOUS | Status: DC

## 2020-05-27 MED ORDER — LACTATED RINGERS IV SOLN: INTRAVENOUS | Status: DC

## 2020-05-27 MED ORDER — CEFAZOLIN SODIUM-DEXTROSE 2-4 GM/100ML-% IV SOLN
INTRAVENOUS | Status: AC
  Filled 2020-05-27: qty 100

## 2020-05-27 MED ORDER — PHENYLEPHRINE HCL (PRESSORS) 10 MG/ML IV SOLN
INTRAVENOUS | Status: DC | PRN
  Administered 2020-05-27 (×2): 80 ug via INTRAVENOUS
  Administered 2020-05-27: 200 ug via INTRAVENOUS
  Administered 2020-05-27 (×3): 80 ug via INTRAVENOUS
  Administered 2020-05-27: 120 ug via INTRAVENOUS

## 2020-05-27 MED ORDER — FENTANYL CITRATE (PF) 100 MCG/2ML IJ SOLN
INTRAMUSCULAR | Status: DC | PRN
  Administered 2020-05-27: 25 ug via INTRAVENOUS
  Administered 2020-05-27: 75 ug via INTRAVENOUS

## 2020-05-27 SURGICAL SUPPLY — 14 items
DEVICE INFLATION ENCORE 26 (MISCELLANEOUS) IMPLANT
GUIDEWIRE HYDRA JAGWIRE .35 (WIRE) IMPLANT
GUIDEWIRE JAG HINI 025X260CM (WIRE) IMPLANT
LUBRICANT JELLY 4.5OZ STERILE (MISCELLANEOUS) IMPLANT
POSITIONER HEAD 8X9X4 ADT (SOFTGOODS) IMPLANT
SCOPE SPY DS DISPOSABLE (MISCELLANEOUS) ×1 IMPLANT
SNARE ROTATE MED OVAL 20MM (MISCELLANEOUS) IMPLANT
SNARE SHORT THROW 13M SML OVAL (MISCELLANEOUS) IMPLANT
SPHINCTEROTOME AUTOTOME .25 (MISCELLANEOUS) IMPLANT
SYSTEM CONTINUOUS INJECTION (MISCELLANEOUS) ×1 IMPLANT
TUBING INSUFFLATOR CO2MPACT (TUBING) ×1 IMPLANT
WALLSTENT METAL COVERED 10X60 (STENTS) IMPLANT
WALLSTENT METAL COVERED 10X80 (STENTS) IMPLANT
WATER STERILE IRR 1000ML POUR (IV SOLUTION) ×1 IMPLANT

## 2020-05-27 NOTE — H&P (Signed)
Daniel Short is an 75 y.o. male.   Chief Complaint: Patient is here for ERCP for HPI: Patient is 75 year old Caucasian male was evaluated last year for recurrent abdominal and chest pain and found to have choledocholithiasis as well as cholelithiasis. He underwent ERCP with sphincterotomy with removal of large stone as well as smaller fragments in debris. One stone could not be removed. Therefore plastic stent was left in place. He is now returning for stent and stone removal. She has no complaints. He has not had any more chest or epigastric pain. He did notice fleeting periumbilical pain yesterday. He remains with good appetite. He denies nausea vomiting melena or rectal bleeding. He does not take anticoagulants or aspirin. Patient will also need to have his gallbladder removed. He has been referred to [facility] surgery.  Lab data from 05/25/2020 reviewed. LFTs are normal except mild hyperbilirubinemia. Bilirubin was fractionated and more than 90% is indirect implying Gilbert's syndrome    Past Medical History:  Diagnosis Date  . Arthritis   . CAD (coronary artery disease)    Status post proximal/ostial RCA Zeta stents 2003  . Chronic back pain   . Diabetes mellitus without complication ([place])   . Dyspnea   . Ear infection 1979   Right ear due to wax build up  . Headache   . History of stroke 2000   Intracerebral hemorrhage  . Myocardial infarction [facility]) 2003    Past Surgical History:  Procedure Laterality Date  . BILIARY STENT PLACEMENT N/A 02/06/2020   Procedure: BILIARY STENT PLACEMENT;  Surgeon: Daniel Houston, MD;  Location: [facility];  Service: Endoscopy;  Laterality: N/A;  . CARPAL TUNNEL RELEASE Left   . CORONARY STENT PLACEMENT  2005  . ERCP N/A 02/06/2020   Procedure: ENDOSCOPIC RETROGRADE CHOLANGIOPANCREATOGRAPHY (ERCP) with stone extraction;  Surgeon: Daniel Houston, MD;  Location: [facility];  Service: Endoscopy;  Laterality: N/A;  1:15  . HERNIA REPAIR      ventral  . KNEE ARTHROSCOPY Right   . LUMBAR LAMINECTOMY/DECOMPRESSION MICRODISCECTOMY N/A 09/14/2016   Procedure: Decompression L3-5, insitu fusion L4-5;  Surgeon: Daniel Schools, MD;  Location: [place];  Service: Orthopedics;  Laterality: N/A;  3 hrs  . LUMBAR LAMINECTOMY/DECOMPRESSION MICRODISCECTOMY N/A 09/17/2016   Procedure: WOUND EXPLORATION, GILL DECOMPRESSION,FAR LATERAL DISCECTOMY LEFT L4-L5, AND EVACUATION OF HEMATOMA;  Surgeon: Daniel Schools, MD;  Location: [place];  Service: Orthopedics;  Laterality: N/A;  . SPHINCTEROTOMY N/A 02/06/2020   Procedure: SPHINCTEROTOMY;  Surgeon: Daniel Houston, MD;  Location: [facility];  Service: Endoscopy;  Laterality: N/A;    Family History  Problem Relation Age of Onset  . Heart attack Mother   . Heart attack Father   . COPD Father   . Heart attack Brother   . Colon cancer Neg Hx    Social History:  reports that he quit smoking about 4 years ago. His smoking use included cigarettes. He has a 13.50 pack-year smoking history. He has never used smokeless tobacco. He reports that he does not drink alcohol and does not use drugs.  Allergies: No Known Allergies  Medications Prior to Admission  Medication Sig Dispense Refill  . allopurinol (ZYLOPRIM) 100 MG tablet Take 1 tablet (100 mg total) by mouth daily. (Needs to be seen before next refill) 90 tablet 3  . atorvastatin (LIPITOR) 20 MG tablet Take 1 tablet (20 mg total) by mouth daily. 90 tablet 3  . blood glucose meter kit and supplies Dispense based on patient  and insurance preference. Use up to four times daily as directed. (FOR ICD-10 E10.9, E11.9). 1 each 0  . HYDROcodone-acetaminophen (NORCO/VICODIN) 5-325 MG tablet Take 1 tablet by mouth every 6 (six) hours as needed for moderate pain.     Marland Kitchen lisinopril (ZESTRIL) 20 MG tablet Take 1 tablet (20 mg total) by mouth daily. (Needs to be seen before next refill) 90 tablet 3  . metFORMIN (GLUCOPHAGE-XR) 500 MG 24 hr tablet Take 1 tablet (500 mg total)  by mouth daily with breakfast. (Needs to be seen before next refill) 90 tablet 3  . OneTouch Delica Lancets 95M MISC Use to test blood sugar daily as directed. DX: E11.65 100 each 11  . Polyethyl Glycol-Propyl Glycol (SYSTANE OP) Place 1 drop into both eyes 2 (two) times daily as needed (dry eyes).    . Blood Glucose Monitoring Suppl ([place]) w/Device KIT Use to test blood sugar daily as directed. DX: E11.65 1 kit 1  . glucose blood (ONETOUCH VERIO) test strip Use to test blood sugar daily as directed. DX: E11.65 100 each 12    Results for orders placed or performed during the hospital encounter of 05/27/20 (from the past 48 hour(s))  Glucose, capillary     Status: Abnormal   Collection Time: 05/27/20 12:52 PM  Result Value Ref Range   Glucose-Capillary 109 (H) 70 - 99 mg/dL    Comment: Glucose reference range applies only to samples taken after fasting for at least 8 hours.   No results found.  Review of Systems  There were no vitals taken for this visit. Physical Exam HENT:     Mouth/Throat:     Mouth: Mucous membranes are moist.     Pharynx: Oropharynx is clear.  Eyes:     Conjunctiva/sclera: Conjunctivae normal.  Cardiovascular:     Rate and Rhythm: Normal rate and regular rhythm.     Heart sounds: Normal heart sounds. No murmur heard.   Pulmonary:     Effort: Pulmonary effort is normal.     Breath sounds: Normal breath sounds.  Abdominal:     Comments: Abdomen is full. On palpation is soft and nontender with organomegaly or masses  Musculoskeletal:        General: No swelling.     Cervical back: Neck supple.  Lymphadenopathy:     Cervical: No cervical adenopathy.  Skin:    General: Skin is warm and dry.  Neurological:     Mental Status: He is alert.      Assessment/Plan  History of choledocholithiasis ERCP with stent and stone removal. Mild indirect hyperbilirubinemia secondary to Gilbert's syndrome.  Daniel Laser, MD 05/27/2020, 1:44  PM

## 2020-05-27 NOTE — Progress Notes (Addendum)
ERCP note.  Plastic biliary stent removed with Dormia basket. Stent was noted to be occluded. Cholangiogram performed with occlusion balloon catheter No filling defects noted in common hepatic or bile duct. Balloon stone extractor in the homeopathic past of the bile duct removal of debris but no stone. Prior sphincterotomy was felt to be adequate. Noncritical narrowing at distal bile duct also noted on prior study felt to be due to presence of ampullary diverticulum. Able to pass 10 mm balloon across this. Pancreatic duct was not cannulated or filled with contrast. Patient tolerated procedure well.  Patient to be admitted for newly diagnosed atrial fibrillation as recommended by anesthesiologist. These notations ventricular rate remained between 75 and 100 during the procedure.

## 2020-05-27 NOTE — Anesthesia Procedure Notes (Signed)
Procedure Name: Intubation Date/Time: 05/27/2020 2:46 PM Performed by: Ollen Bowl, CRNA Pre-anesthesia Checklist: Patient identified, Patient being monitored, Timeout performed, Emergency Drugs available and Suction available Patient Re-evaluated:Patient Re-evaluated prior to induction Oxygen Delivery Method: Circle system utilized Preoxygenation: Pre-oxygenation with 100% oxygen Induction Type: IV induction Ventilation: Mask ventilation without difficulty Laryngoscope Size: Mac and 3 Grade View: Grade I Tube type: Oral Tube size: 7.0 mm Number of attempts: 1 Airway Equipment and Method: Stylet Placement Confirmation: ETT inserted through vocal cords under direct vision,  positive ETCO2 and breath sounds checked- equal and bilateral Secured at: 21 cm Tube secured with: Tape Dental Injury: Teeth and Oropharynx as per pre-operative assessment

## 2020-05-27 NOTE — Anesthesia Preprocedure Evaluation (Addendum)
Anesthesia Evaluation  Patient identified by MRN, date of birth, ID band Patient awake    Reviewed: Allergy & Precautions, NPO status , Patient's Chart, lab work & pertinent test results  History of Anesthesia Complications Negative for: history of anesthetic complications  Airway Mallampati: II  TM Distance: >3 FB Neck ROM: Full    Dental  (+) Edentulous Upper, Edentulous Lower, Upper Dentures Very loose teeth, risk of loosing teeth and aspiration of loose teeth was explained:   Pulmonary shortness of breath and with exertion, former smoker,    Pulmonary exam normal breath sounds clear to auscultation       Cardiovascular Exercise Tolerance: Poor hypertension, Pt. on medications + CAD, + Past MI and + Cardiac Stents  + dysrhythmias (PACs)  Rhythm:Irregular Rate:Abnormal - Systolic murmurs, - Diastolic murmurs, - Friction Rub, - Carotid Bruit, - Peripheral Edema and - Systolic Click  Stress test - 6010 The left ventricular ejection fraction is mildly decreased (45-54%).  Nuclear stress EF: 50%.  There was no ST segment deviation noted during stress.  Findings consistent with prior myocardial infarction.  This is a low risk study.   Small area of mid inferior wall infarct no ischemia EF estimated at 50% no discrete RWMA    Neuro/Psych  Headaches,  Neuromuscular disease CVA, Residual Symptoms negative psych ROS   GI/Hepatic negative GI ROS,   Endo/Other  diabetes, Well Controlled, Type 2, Oral Hypoglycemic Agents  Renal/GU negative Renal ROS     Musculoskeletal  (+) Arthritis  (back pain),   Abdominal   Peds  Hematology negative hematology ROS (+)   Anesthesia Other Findings Patient was in atrial fibrillation, heart rate 80s and 90s. Discussed with  Dr. Laural Golden, stent placed 3 months ago, needs to remove the stent as early as possible.patient was not in distress, not c/o chest pain or sob, plan is to proceed  with the procedure as long as patient rate is not high and admit the patient after the procedure for cardiology consultation.  Reproductive/Obstetrics negative OB ROS                           Anesthesia Physical  Anesthesia Plan  ASA: III  Anesthesia Plan: General   Post-op Pain Management:    Induction: Intravenous  PONV Risk Score and Plan: 3 and Ondansetron  Airway Management Planned: Oral ETT  Additional Equipment:   Intra-op Plan:   Post-operative Plan: Extubation in OR  Informed Consent: I have reviewed the patients History and Physical, chart, labs and discussed the procedure including the risks, benefits and alternatives for the proposed anesthesia with the patient or authorized representative who has indicated his/her understanding and acceptance.       Plan Discussed with: CRNA and Surgeon  Anesthesia Plan Comments:        Anesthesia Quick Evaluation

## 2020-05-27 NOTE — Transfer of Care (Signed)
Immediate Anesthesia Transfer of Care Note  Patient: Daniel Short  Procedure(s) Performed: ENDOSCOPIC RETROGRADE CHOLANGIOPANCREATOGRAPHY (ERCP) WITH PROPOFOL (N/A ) STENT REMOVAL (N/A )  Patient Location: PACU  Anesthesia Type:General  Level of Consciousness: awake and alert   Airway & Oxygen Therapy: Patient Spontanous Breathing and Patient connected to nasal cannula oxygen  Post-op Assessment: Report given to RN and Post -op Vital signs reviewed and stable  Post vital signs: Reviewed and stable  Last Vitals:  Vitals Value Taken Time  BP 136/59   Temp    Pulse 81   Resp 16   SpO2 96%     Last Pain: There were no vitals filed for this visit.       Complications: No complications documented.

## 2020-05-27 NOTE — Progress Notes (Signed)
Pt upset and stated the doctor that came to bedside was rude and was throwing information at him. Pt very emotional and wanted to know why things were happening and he was not made aware. Advised pt no set schedule for acute happenings. Pt resting well, bed in low position, call bell in reach, pt requested for his wife to be called tomorrow if the MD comes around.

## 2020-05-27 NOTE — H&P (Signed)
History and Physical    Daniel Short TKP:[id number] DOB: [birth year] DOA: 05/27/2020  PCP: Sharion Balloon, FNP (Confirm with patient/family/NH records and if not entered, this has to be entered at [facility] point of entry) Patient coming from: Referred from PACU post procedure  I have personally briefly reviewed patient's old medical records in [place]  Chief Complaint: a fib  HPI: Daniel Short is a 75 y.o. male with medical history significant of CAD, s/p MI, s/p RCA stent 2003, DM, h/o CVA. Patient was for ERCP stent retrieval procedure today Dr. Laural Golden. Per patient's wife she was told he had an irregular heart rhythm at the time of procedure. Post-op in PACU patient was in a fib with a rate between 80-100. Dr. Laural Golden called and requested patient be admitted for management of new on-set a fib an dsuggested cardiology consult in AM. Patient was hemodynamically stable.   ED Course: not seen in ED but in PACU  T98.2  125/82 HR 92  Review of Systems: As per HPI otherwise 10 point review of systems negative.    Past Medical History:  Diagnosis Date  . Arthritis   . CAD (coronary artery disease)    Status post proximal/ostial RCA Zeta stents 2003  . Chronic back pain   . Diabetes mellitus without complication ([place])   . Dyspnea   . Ear infection 1979   Right ear due to wax build up  . Headache   . History of stroke 2000   Intracerebral hemorrhage  . Myocardial infarction [facility]) 2003    Past Surgical History:  Procedure Laterality Date  . BILIARY STENT PLACEMENT N/A 02/06/2020   Procedure: BILIARY STENT PLACEMENT;  Surgeon: Rogene Houston, MD;  Location: [facility];  Service: Endoscopy;  Laterality: N/A;  . CARPAL TUNNEL RELEASE Left   . CORONARY STENT PLACEMENT  2005  . ERCP N/A 02/06/2020   Procedure: ENDOSCOPIC RETROGRADE CHOLANGIOPANCREATOGRAPHY (ERCP) with stone extraction;  Surgeon: Rogene Houston, MD;  Location: [facility];  Service: Endoscopy;  Laterality: N/A;  1:15   . HERNIA REPAIR     ventral  . KNEE ARTHROSCOPY Right   . LUMBAR LAMINECTOMY/DECOMPRESSION MICRODISCECTOMY N/A 09/14/2016   Procedure: Decompression L3-5, insitu fusion L4-5;  Surgeon: Melina Schools, MD;  Location: [place];  Service: Orthopedics;  Laterality: N/A;  3 hrs  . LUMBAR LAMINECTOMY/DECOMPRESSION MICRODISCECTOMY N/A 09/17/2016   Procedure: WOUND EXPLORATION, GILL DECOMPRESSION,FAR LATERAL DISCECTOMY LEFT L4-L5, AND EVACUATION OF HEMATOMA;  Surgeon: Melina Schools, MD;  Location: [place];  Service: Orthopedics;  Laterality: N/A;  . SPHINCTEROTOMY N/A 02/06/2020   Procedure: SPHINCTEROTOMY;  Surgeon: Rogene Houston, MD;  Location: [facility];  Service: Endoscopy;  Laterality: N/A;    Soc Hx - married. Retired. A reluctant historian   reports that he quit smoking about 4 years ago. His smoking use included cigarettes. He has a 13.50 pack-year smoking history. He has never used smokeless tobacco. He reports that he does not drink alcohol and does not use drugs.  No Known Allergies  Family History  Problem Relation Age of Onset  . Heart attack Mother   . Heart attack Father   . COPD Father   . Heart attack Brother   . Colon cancer Neg Hx      Prior to Admission medications   Medication Sig Start Date End Date Taking? Authorizing Provider  allopurinol (ZYLOPRIM) 100 MG tablet Take 1 tablet (100 mg total) by mouth daily. (Needs to be seen before next  refill) 06/11/19  Yes Hawks, Christy A, FNP  atorvastatin (LIPITOR) 20 MG tablet Take 1 tablet (20 mg total) by mouth daily. 06/17/19  Yes Hawks, Theador Hawthorne, FNP  blood glucose meter kit and supplies Dispense based on patient and insurance preference. Use up to four times daily as directed. (FOR ICD-10 E10.9, E11.9). 04/12/18  Yes Hawks, Christy A, FNP  HYDROcodone-acetaminophen (NORCO/VICODIN) 5-325 MG tablet Take 1 tablet by mouth every 6 (six) hours as needed for moderate pain.  02/13/18  Yes [provider]  lisinopril (ZESTRIL)  20 MG tablet Take 1 tablet (20 mg total) by mouth daily. (Needs to be seen before next refill) 06/11/19  Yes Hawks, Theador Hawthorne, FNP  metFORMIN (GLUCOPHAGE-XR) 500 MG 24 hr tablet Take 1 tablet (500 mg total) by mouth daily with breakfast. (Needs to be seen before next refill) 06/11/19  Yes Hawks, Theador Hawthorne, FNP  OneTouch Delica Lancets 79G MISC Use to test blood sugar daily as directed. DX: E11.65 11/13/19  Yes Hawks, Alyse Low A, FNP  Polyethyl Glycol-Propyl Glycol (SYSTANE OP) Place 1 drop into both eyes 2 (two) times daily as needed (dry eyes).   Yes [provider]  Blood Glucose Monitoring Suppl ([place]) w/Device KIT Use to test blood sugar daily as directed. DX: E11.65 11/13/19   Evelina Dun A, FNP  glucose blood (ONETOUCH VERIO) test strip Use to test blood sugar daily as directed. DX: E11.65 11/13/19   Sharion Balloon, FNP    Physical Exam: Vitals:   05/27/20 1630 05/27/20 1645 05/27/20 1712 05/27/20 1717  BP: 122/74 127/68 125/82 125/82  Pulse: 79 84 67 67  Resp: (!) 9 16 18 18   Temp:   98.2 F (36.8 C) 98.2 F (36.8 C)  TempSrc:   Oral Oral  SpO2: 97% 95% 99%   Weight:    94.9 kg  Height:    5' 7.5" (1.715 m)     Vitals:   05/27/20 1630 05/27/20 1645 05/27/20 1712 05/27/20 1717  BP: 122/74 127/68 125/82 125/82  Pulse: 79 84 67 67  Resp: (!) 9 16 18 18   Temp:   98.2 F (36.8 C) 98.2 F (36.8 C)  TempSrc:   Oral Oral  SpO2: 97% 95% 99%   Weight:    94.9 kg  Height:    5' 7.5" (1.715 m)   General: an overweight man who is irritable but in no distress Eyes: PERRL, lids and conjunctivae normal ENMT: Mucous membranes are moist. Posterior pharynx clear of any exudate or lesions.edentulous  Neck: normal, supple, no masses, no thyromegaly Respiratory: clear to auscultation bilaterally, no wheezing, no crackles. Normal respiratory effort. No accessory muscle use.  Cardiovascular: IRIR at rate of 90, no murmurs / rubs / gallops. No extremity edema. 2+  pedal pulses. No carotid bruits.  Abdomen: No tenderness.  Bowel sounds positive.  Musculoskeletal: no clubbing / cyanosis. No joint deformity upper and lower extremities. No tophi. Good ROM, no contractures. Normal muscle tone.  Skin: no rashes, lesions, ulcers. No induration Neurologic: CN 2-12 grossly intact.Strength 5/5 in all 4.  Psychiatric: Poor insight. Has a difficult time processing information. Alert and oriented x 3. Irritable mood.     Labs on Admission: I have personally reviewed following labs and imaging studies  CBC: Recent Labs  Lab 05/25/20 1316  WBC 9.9  NEUTROABS 5.3  HGB 15.4  HCT 47.1  MCV 92.9  PLT 921   Basic Metabolic Panel: Recent Labs  Lab 05/25/20 1351  NA 138  K 4.5  CL 102  CO2 26  GLUCOSE 90  BUN 10  CREATININE 0.96  CALCIUM 9.4   GFR: Estimated Creatinine Clearance: 73.6 mL/min (by C-G formula based on SCr of 0.96 mg/dL). Liver Function Tests: Recent Labs  Lab 05/25/20 1351 05/26/20 0905  AST 36  --   ALT 35  --   ALKPHOS 60  --   BILITOT 1.9* 1.5*  PROT 7.4  --   ALBUMIN 4.3  --    No results for input(s): LIPASE, AMYLASE in the last 168 hours. No results for input(s): AMMONIA in the last 168 hours. Coagulation Profile: No results for input(s): INR, PROTIME in the last 168 hours. Cardiac Enzymes: No results for input(s): CKTOTAL, CKMB, CKMBINDEX, TROPONINI in the last 168 hours. BNP (last 3 results) No results for input(s): PROBNP in the last 8760 hours. HbA1C: No results for input(s): HGBA1C in the last 72 hours. CBG: Recent Labs  Lab 05/27/20 1252 05/27/20 1629 05/27/20 1725  GLUCAP 109* 182* 128*   Lipid Profile: No results for input(s): CHOL, HDL, LDLCALC, TRIG, CHOLHDL, LDLDIRECT in the last 72 hours. Thyroid Function Tests: No results for input(s): TSH, T4TOTAL, FREET4, T3FREE, THYROIDAB in the last 72 hours. Anemia Panel: No results for input(s): VITAMINB12, FOLATE, FERRITIN, TIBC, IRON, RETICCTPCT in the  last 72 hours. Urine analysis:    Component Value Date/Time   APPEARANCEUR Cloudy (A) 01/02/2020 0955   GLUCOSEU Negative 01/02/2020 0955   BILIRUBINUR Negative 01/02/2020 0955   PROTEINUR 1+ (A) 01/02/2020 0955   UROBILINOGEN negative 04/16/2015 1527   NITRITE Negative 01/02/2020 0955   LEUKOCYTESUR Negative 01/02/2020 0955    Radiological Exams on Admission: DG ERCP  Result Date: 05/27/2020 CLINICAL DATA:  History of choledocholithiasis and prior biliary stenting. EXAM: ERCP TECHNIQUE: Multiple spot images obtained with the fluoroscopic device and submitted for interpretation post-procedure. COMPARISON:  02/06/2020 FINDINGS: Imaging obtained with a C-arm demonstrates removal of a plastic endoscopic common bile duct stent. Cholangiogram demonstrates irregularity of the common bile duct without high-grade stricture or visible filling defect. IMPRESSION: After removal of an indwelling common bile duct biliary stent, cholangiogram demonstrates some mild irregularity of the common bile duct without filling defect or high-grade stricture. These images were submitted for radiologic interpretation only. Please see the procedural report for the amount of contrast and the fluoroscopy time utilized. Electronically Signed   By: Aletta Edouard M.D.   On: 05/27/2020 16:39    EKG: Independently reviewed. A fib at rate of 92  Assessment/Plan Active Problems:   Atrial fibrillation with RVR (HCC)   CAD (coronary artery disease), native coronary artery   Hypertension associated with diabetes ([place])   Diabetes mellitus (HCC)   Gout    1. A Fib - reviewed prior EKG which was NSR. Patient and wife do not recall previous dx of a fib making this new onset. Rate is controlled. No chest pain. May be related to procedure today.  Plan Tele observation  Will defer CCB since rate is controlled - if a fib persists consider oral CCB  Recommended NOAC based on CHADS-vasc2 score of 4 - pharmacy recommends holding  until AM. Family most likely will prefer recommendation come from cardiology  Cardiology consult - sent message via AMION  2. CAD - stable EKG. Asymptomatic  3. HTN- continue home meds  4. DM - continue home meds plus sliding scale  5. Gout - continue allopurinol  DVT prophylaxis: Eliquis - to being in AM due to  recent procedure with low bleeding risk Code Status: full code  Family Communication: spoke with Mrs. Lashley who made it very clear she wishes to be present for cardiology evaluation. Nursing staff notified. She was reluctant to have any explanation provided re: Dx or Tx options preferring to wait until AM  Disposition - home when stable: 24-48 hrs Consults called: catrdiology - Dr. Radford Pax notified (with names) Admission status: observation tele.   Adella Hare MD [facility] Pager [phone]  If 7PM-7AM, please contact night-coverage [email or web address] Password [email or web address]  05/27/2020, 8:10 PM

## 2020-05-27 NOTE — Anesthesia Postprocedure Evaluation (Signed)
Anesthesia Post Note  Patient: Daniel Short  Procedure(s) Performed: ENDOSCOPIC RETROGRADE CHOLANGIOPANCREATOGRAPHY (ERCP) WITH PROPOFOL (N/A ) STENT REMOVAL (N/A )  Patient location during evaluation: PACU Anesthesia Type: General Level of consciousness: awake and alert and oriented Pain management: pain level controlled Vital Signs Assessment: post-procedure vital signs reviewed and stable Respiratory status: spontaneous breathing and respiratory function stable Cardiovascular status: blood pressure returned to baseline and stable Postop Assessment: no apparent nausea or vomiting Anesthetic complications: no   No complications documented.   Last Vitals:  Vitals:   05/27/20 1630 05/27/20 1645  BP: 122/74 127/68  Pulse: 79 84  Resp: (!) 9 16  Temp:    SpO2: 97% 95%    Last Pain:  Vitals:   05/27/20 1640  PainSc: 10-Worst pain ever                 Rajamani C Battula

## 2020-05-27 NOTE — Discharge Instructions (Signed)

## 2020-05-27 NOTE — Op Note (Signed)
[facility] Patient Name: Daniel Short Procedure Date: 05/27/2020 2:03 PM MRN: [id number] Date of Birth: 01-08-1946 Attending MD: Hildred Laser , MD CSN: [id number] Age: 75 Admit Type: Inpatient Procedure:                ERCP Indications:              Biliary stent removal Providers:                Hildred Laser, MD, Lurline Del, RN, Casimer Bilis, Technician, Randa Spike, Technician Referring MD:             Evelina Dun, FNP Medicines:                General Anesthesia Complications:            No immediate complications. Estimated Blood Loss:     Estimated blood loss: none. Procedure:                Pre-Anesthesia Assessment:                           - Prior to the procedure, a History and Physical                            was performed, and patient medications and                            allergies were reviewed. The patient's tolerance of                            previous anesthesia was also reviewed. The risks                            and benefits of the procedure and the sedation                            options and risks were discussed with the patient.                            All questions were answered, and informed consent                            was obtained. Prior Anticoagulants: The patient has                            taken no previous anticoagulant or antiplatelet                            agents. ASA Grade Assessment: III - A patient with                            severe systemic disease. After reviewing the risks  and benefits, the patient was deemed in                            satisfactory condition to undergo the procedure.                           After obtaining informed consent, the scope was                            passed under direct vision. Throughout the                            procedure, the patient's blood pressure, pulse, and                             oxygen saturations were monitored continuously. The                            [id number]-[id number] ([id number]) scope was introduced through                            the mouth, and used to inject contrast into and                            used to inject contrast into the bile duct. The                            ERCP was accomplished without difficulty. The                            patient tolerated the procedure well. Scope In: 3:09:25 PM Scope Out: 3:54:52 PM Total Procedure Duration: 0 hours 45 minutes 27 seconds  Findings:      A biliary stent was visible on the scout film. The esophagus was       successfully intubated under direct vision. The scope was advanced to a       normal major papilla in the descending duodenum without detailed       examination of the pharynx, larynx and associated structures, and upper       GI tract. The upper GI tract was grossly normal. One stent was removed       from the biliary tree using a snare. Stent had migrated migrated into       the bile duct but it was still seen. Stent was found to be occluded via       the water column test. The major papilla was bulging. The bile duct was       deeply cannulated with the 9 mm balloon. Contrast was injected. I       personally interpreted the bile duct images. There was brisk flow of       contrast through the ducts. Image quality was adequate. Contrast       extended to the entire biliary tree. CBD and CHD were 7 to 8 mm in       diameter. Intrahepatic biliary radicles were normal. Narrowing noted to       distal segment of bile  duct felt to be related to location of ampulla       and a diverticulum. Balloon stone extractor and Dormia basket passed       through bile duct with removal of debris but there was no stone. Balloon       catheter was passed across this area of narrowing without any       difficulty. Sphincterotomy was felt to be adequate and not extended.       Pancreatic duct neither cannulated nor  filled with contrast. Impression:               - One stent was removed from the biliary tree.                           - Common hepatic and bile duct measured 7 to 8 mm                            in diameter. There were no stones but debris                            removed with balloon stone extractor.                           - Noncritical distal narrowing felt to be due to                            location of ampulla within a diverticulum. Moderate Sedation:      Per Anesthesia Care Recommendation:           - Admit the patient to hospital ward for                            observation because of new onset of atrial                            fibrillation which was noted during preop                            evaluation.                           - Clear liquid diet today.                           - Continue present medications.                           - Cardiology consultation.                           -No contraindication to anticoagulation. Procedure Code(s):        --- Professional ---                           [phone], Endoscopic retrograde                            cholangiopancreatography (  ERCP); with removal of                            foreign body(s) or stent(s) from biliary/pancreatic                            duct(s) Diagnosis Code(s):        --- Professional ---                           Z36.64, Encounter for fitting and adjustment of                            other gastrointestinal appliance and device CPT copyright 2019 American Medical Association. All rights reserved. The codes documented in this report are preliminary and upon coder review may  be revised to meet current compliance requirements. Hildred Laser, MD Hildred Laser, MD 05/27/2020 4:37:28 PM This report has been signed electronically. Number of Addenda: 0

## 2020-05-28 ENCOUNTER — Encounter (HOSPITAL_COMMUNITY): Payer: Self-pay | Admitting: Internal Medicine

## 2020-05-28 ENCOUNTER — Observation Stay (HOSPITAL_BASED_OUTPATIENT_CLINIC_OR_DEPARTMENT_OTHER): Payer: Medicare HMO

## 2020-05-28 DIAGNOSIS — I252 Old myocardial infarction: Secondary | ICD-10-CM | POA: Diagnosis not present

## 2020-05-28 DIAGNOSIS — I10 Essential (primary) hypertension: Secondary | ICD-10-CM

## 2020-05-28 DIAGNOSIS — Z8679 Personal history of other diseases of the circulatory system: Secondary | ICD-10-CM | POA: Diagnosis not present

## 2020-05-28 DIAGNOSIS — M109 Gout, unspecified: Secondary | ICD-10-CM | POA: Diagnosis not present

## 2020-05-28 DIAGNOSIS — I2510 Atherosclerotic heart disease of native coronary artery without angina pectoris: Secondary | ICD-10-CM | POA: Diagnosis not present

## 2020-05-28 DIAGNOSIS — E785 Hyperlipidemia, unspecified: Secondary | ICD-10-CM | POA: Diagnosis not present

## 2020-05-28 DIAGNOSIS — I152 Hypertension secondary to endocrine disorders: Secondary | ICD-10-CM

## 2020-05-28 DIAGNOSIS — I4891 Unspecified atrial fibrillation: Secondary | ICD-10-CM

## 2020-05-28 DIAGNOSIS — E1159 Type 2 diabetes mellitus with other circulatory complications: Secondary | ICD-10-CM | POA: Diagnosis not present

## 2020-05-28 DIAGNOSIS — K8050 Calculus of bile duct without cholangitis or cholecystitis without obstruction: Secondary | ICD-10-CM

## 2020-05-28 DIAGNOSIS — Z87891 Personal history of nicotine dependence: Secondary | ICD-10-CM | POA: Diagnosis not present

## 2020-05-28 DIAGNOSIS — Z8673 Personal history of transient ischemic attack (TIA), and cerebral infarction without residual deficits: Secondary | ICD-10-CM | POA: Diagnosis not present

## 2020-05-28 DIAGNOSIS — Z4659 Encounter for fitting and adjustment of other gastrointestinal appliance and device: Secondary | ICD-10-CM | POA: Diagnosis not present

## 2020-05-28 DIAGNOSIS — E119 Type 2 diabetes mellitus without complications: Secondary | ICD-10-CM | POA: Diagnosis not present

## 2020-05-28 LAB — ECHOCARDIOGRAM COMPLETE
AR max vel: 2.76 cm2
AV Area VTI: 2.28 cm2
AV Area mean vel: 2.22 cm2
AV Mean grad: 2.8 mmHg
AV Peak grad: 5.9 mmHg
Ao pk vel: 1.22 m/s
Area-P 1/2: 3.97 cm2
Height: 67.5 in
S' Lateral: 3.58 cm
Weight: 3347.46 oz

## 2020-05-28 LAB — LIPID PANEL
Cholesterol: 119 mg/dL (ref 0–200)
HDL: 31 mg/dL — ABNORMAL LOW (ref >40)
LDL Cholesterol: 64 mg/dL (ref 0–99)
Total CHOL/HDL Ratio: 3.8 RATIO
Triglycerides: 121 mg/dL (ref <150)
VLDL: 24 mg/dL (ref 0–40)

## 2020-05-28 LAB — GLUCOSE, CAPILLARY
Glucose-Capillary: 113 mg/dL — ABNORMAL HIGH (ref 70–99)
Glucose-Capillary: 138 mg/dL — ABNORMAL HIGH (ref 70–99)

## 2020-05-28 LAB — HEMOGLOBIN A1C
Hgb A1c MFr Bld: 6.2 % — ABNORMAL HIGH (ref 4.8–5.6)
Mean Plasma Glucose: 131.24 mg/dL

## 2020-05-28 MED ORDER — METOPROLOL TARTRATE 25 MG PO TABS: 12.5000 mg | ORAL_TABLET | Freq: Two times a day (BID) | ORAL | 1 refills | Status: DC

## 2020-05-28 MED ORDER — METOPROLOL TARTRATE 25 MG PO TABS
12.5000 mg | ORAL_TABLET | Freq: Two times a day (BID) | ORAL | Status: DC
  Administered 2020-05-28: 12.5 mg via ORAL
  Filled 2020-05-28: qty 1

## 2020-05-28 MED ORDER — APIXABAN 5 MG PO TABS: 5.0000 mg | ORAL_TABLET | Freq: Two times a day (BID) | ORAL | 1 refills | Status: DC

## 2020-05-28 NOTE — Progress Notes (Signed)
Pt has discharge orders. Discharge teaching given to pt and wife bedside, no further questions at this time.

## 2020-05-28 NOTE — Progress Notes (Incomplete)
Attending note  Patient seen and discussed with NP Dorene Ar, I agree with her documentation. History of CAD with prior stenting as reported above, Dm2, CVA, bile stones s/p stenting. Cardiology consulted due to new onset of afib at the time of ERCP yesterday. From notes afib rates 80s-100s yesterday. Reports some occasoinal palpitations.       New diagnosis of afib. Start lopressor 12.5mg  bid. Already started on eliquis 5mg  bid,from GI note there is no contraindication to anticoagulation. We will get echo this AM, will be able to discharge later this morning/early afternoon. F/u 3-4 weeks, if still in afib could consider possible trial of DCCV after 3 weeks of anticoag   Carlyle Dolly MD

## 2020-05-28 NOTE — Progress Notes (Signed)
*  PRELIMINARY RESULTS* Echocardiogram 2D Echocardiogram has been performed.  Daniel Short 05/28/2020, 10:25 AM

## 2020-05-28 NOTE — Discharge Summary (Signed)
Physician Discharge Summary  Daniel Short UJW:[id number] DOB: 29-Jun-1945 DOA: 05/27/2020  PCP: Sharion Balloon, FNP  Admit date: 05/27/2020 Discharge date: 05/28/2020  Admitted From: home Disposition:  home  Recommendations for Outpatient Follow-up:  1. Follow up with PCP in 1-2 weeks 2. Please obtain BMP/CBC in one week 3. Follow-up with cardiology will be arranged 4. Follow-up with GI as previously scheduled   Discharge Condition: Stable CODE STATUS: Full code Diet recommendation: Heart healthy, carb  Brief/Interim Summary: 75 year old male who had come [facility] for ERCP stent retrieval procedure by Dr. Laural Golden.  His procedure was unremarkable and stent was removed.  Postprocedure, he was noted to be in atrial fibrillation.  Heart rate was in the 80s to 100.  Patient was admitted for further work-up and cardiology evaluation.  Echocardiogram was performed and found to be unremarkable.  Heart rate remained stable overnight.  He was started on low-dose Lopressor as well as anticoagulated with Eliquis.  He was felt stable for discharge from cardiology standpoint.  He will be followed up as an outpatient.  He is not have any other complaints at this time.  Remainder of his medical issues remained stable.  Discharge Diagnoses:  Active Problems:   CAD (coronary artery disease), native coronary artery   Hypertension associated with diabetes ([place])   Diabetes mellitus ([place])   Gout   Atrial fibrillation with [facility])    Discharge Instructions  Discharge Instructions    Diet - low sodium heart healthy   Complete by: As directed    Increase activity slowly   Complete by: As directed      Allergies as of 05/28/2020   No Known Allergies     Medication List    TAKE these medications   allopurinol 100 MG tablet Commonly known as: ZYLOPRIM Take 1 tablet (100 mg total) by mouth daily. (Needs to be seen before next refill)   apixaban 5 MG Tabs tablet Commonly known as:  ELIQUIS Take 1 tablet (5 mg total) by mouth 2 (two) times daily.   atorvastatin 20 MG tablet Commonly known as: LIPITOR Take 1 tablet (20 mg total) by mouth daily.   blood glucose meter kit and supplies Dispense based on patient and insurance preference. Use up to four times daily as directed. (FOR ICD-10 E10.9, E11.9).   HYDROcodone-acetaminophen 5-325 MG tablet Commonly known as: NORCO/VICODIN Take 1 tablet by mouth every 6 (six) hours as needed for moderate pain.   lisinopril 20 MG tablet Commonly known as: ZESTRIL Take 1 tablet (20 mg total) by mouth daily. (Needs to be seen before next refill)   metFORMIN 500 MG 24 hr tablet Commonly known as: GLUCOPHAGE-XR Take 1 tablet (500 mg total) by mouth daily with breakfast. (Needs to be seen before next refill)   metoprolol tartrate 25 MG tablet Commonly known as: LOPRESSOR Take 0.5 tablets (12.5 mg total) by mouth 2 (two) times daily.   OneTouch Delica Lancets 56O Misc Use to test blood sugar daily as directed. DX: E11.65   OneTouch Verio Flex System w/Device Kit Use to test blood sugar daily as directed. DX: E11.65   OneTouch Verio test strip Generic drug: glucose blood Use to test blood sugar daily as directed. DX: E11.65   SYSTANE OP Place 1 drop into both eyes 2 (two) times daily as needed (dry eyes).       Follow-up Information    Satira Sark, MD Follow up on 06/16/2020.   Specialty: Cardiology Why: at  11:45 AM with his PA Ermalinda Barrios  Contact information: [place] [address] [phone]              No Known Allergies  Consultations:  Cardiology   Procedures/Studies: DG ERCP  Result Date: 05/27/2020 CLINICAL DATA:  History of choledocholithiasis and prior biliary stenting. EXAM: ERCP TECHNIQUE: Multiple spot images obtained with the fluoroscopic device and submitted for interpretation post-procedure. COMPARISON:  02/06/2020 FINDINGS: Imaging obtained with a C-arm  demonstrates removal of a plastic endoscopic common bile duct stent. Cholangiogram demonstrates irregularity of the common bile duct without high-grade stricture or visible filling defect. IMPRESSION: After removal of an indwelling common bile duct biliary stent, cholangiogram demonstrates some mild irregularity of the common bile duct without filling defect or high-grade stricture. These images were submitted for radiologic interpretation only. Please see the procedural report for the amount of contrast and the fluoroscopy time utilized. Electronically Signed   By: Aletta Edouard M.D.   On: 05/27/2020 16:39   ECHOCARDIOGRAM COMPLETE  Result Date: 05/28/2020    ECHOCARDIOGRAM REPORT   Patient Name:   Daniel Short Date of Exam: 05/28/2020 Medical Rec #:  [id number]       Height:       67.5 in Accession #:    [id number]      Weight:       209.2 lb Date of Birth:  1945/09/25      BSA:          2.073 m Patient Age:    48 years        BP:           126/73 mmHg Patient Gender: M               HR:           72 bpm. Exam Location:  [facility] Procedure: 2D Echo Indications:    Atrial Fibrillation I48.91  History:        Patient has no prior history of Echocardiogram examinations.                 CAD, Arrythmias:Atrial Fibrillation; Risk Factors:Hypertension,                 Diabetes, Dyslipidemia and Former Smoker.  Sonographer:    Carleton Nation Referring Phys: [id number] [place]  1. Left ventricular ejection fraction, by estimation, is 50%. The left ventricle has low normal function. Left ventricular endocardial border not optimally defined to evaluate regional wall motion. There is mild left ventricular hypertrophy. Left ventricular diastolic parameters are indeterminate.  2. Right ventricular systolic function is normal. The right ventricular size is normal.  3. The mitral valve is normal in structure. No evidence of mitral valve regurgitation. No evidence of mitral stenosis.  4. The aortic valve  was not well visualized. Aortic valve regurgitation is not visualized. No aortic stenosis is present.  5. The inferior vena cava is normal in size with greater than 50% respiratory variability, suggesting right atrial pressure of 3 mmHg. FINDINGS  Left Ventricle: Left ventricular ejection fraction, by estimation, is 50%. The left ventricle has low normal function. Left ventricular endocardial border not optimally defined to evaluate regional wall motion. The left ventricular internal cavity size was normal in size. There is mild left ventricular hypertrophy. Left ventricular diastolic parameters are indeterminate. Right Ventricle: The right ventricular size is normal. No increase in right ventricular wall thickness. Right ventricular systolic function is normal. Left Atrium:  Left atrial size was normal in size. Right Atrium: Right atrial size was normal in size. Pericardium: There is no evidence of pericardial effusion. Mitral Valve: The mitral valve is normal in structure. No evidence of mitral valve regurgitation. No evidence of mitral valve stenosis. Tricuspid Valve: The tricuspid valve is not well visualized. Tricuspid valve regurgitation is not demonstrated. No evidence of tricuspid stenosis. Aortic Valve: The aortic valve was not well visualized. Aortic valve regurgitation is not visualized. No aortic stenosis is present. Aortic valve mean gradient measures 2.8 mmHg. Aortic valve peak gradient measures 5.9 mmHg. Aortic valve area, by VTI measures 2.28 cm. Pulmonic Valve: The pulmonic valve was not well visualized. Pulmonic valve regurgitation is not visualized. No evidence of pulmonic stenosis. Aorta: The aortic root is normal in size and structure. Pulmonary Artery: Indeterminant PASP, inadequate TR jet. Venous: The inferior vena cava is normal in size with greater than 50% respiratory variability, suggesting right atrial pressure of 3 mmHg. IAS/Shunts: No atrial level shunt detected by color flow Doppler.   LEFT VENTRICLE PLAX 2D LVIDd:         4.78 cm  Diastology LVIDs:         3.58 cm  LV e' medial:    9.57 cm/s LV PW:         1.20 cm  LV E/e' medial:  6.9 LV IVS:        1.20 cm  LV e' lateral:   11.60 cm/s LVOT diam:     2.00 cm  LV E/e' lateral: 5.7 LV SV:         46 LV SV Index:   22 LVOT Area:     3.14 cm  RIGHT VENTRICLE RV S prime:     17.30 cm/s TAPSE (M-mode): 2.0 cm LEFT ATRIUM             Index       RIGHT ATRIUM           Index LA diam:        2.80 cm 1.35 cm/m  RA Area:     17.50 cm LA Vol (A2C):   71.4 ml 34.45 ml/m RA Volume:   51.40 ml  24.80 ml/m LA Vol (A4C):   60.6 ml 29.24 ml/m LA Biplane Vol: 66.9 ml 32.28 ml/m  AORTIC VALVE AV Area (Vmax):    2.76 cm AV Area (Vmean):   2.22 cm AV Area (VTI):     2.28 cm AV Vmax:           121.62 cm/s AV Vmean:          78.185 cm/s AV VTI:            0.204 m AV Peak Grad:      5.9 mmHg AV Mean Grad:      2.8 mmHg LVOT Vmax:         106.66 cm/s LVOT Vmean:        55.208 cm/s LVOT VTI:          0.148 m LVOT/AV VTI ratio: 0.73  AORTA Ao Root diam: 3.00 cm MITRAL VALVE MV Area (PHT): 3.97 cm    SHUNTS MV Decel Time: 191 msec    Systemic VTI:  0.15 m MV E velocity: 66.20 cm/s  Systemic Diam: 2.00 cm MV A velocity: 39.40 cm/s MV E/A ratio:  1.68 Carlyle Dolly MD Electronically signed by Carlyle Dolly MD Signature Date/Time: 05/28/2020/10:38:54 AM    Final  Subjective: Feeling better.  No shortness of breath, no chest pain, anxious to discharge home  Discharge Exam: Vitals:   05/27/20 1712 05/27/20 1717 05/27/20 2140 05/28/20 0939  BP: 125/82 125/82 126/73   Pulse: 67 67 72 76  Resp: 18 18 20    Temp: 98.2 F (36.8 C) 98.2 F (36.8 C) 98.8 F (37.1 C)   TempSrc: Oral Oral Oral   SpO2: 99%  98%   Weight:  94.9 kg    Height:  5' 7.5" (1.715 m)      General: Pt is alert, awake, not in acute distress Cardiovascular: RRR, S1/S2 +, no rubs, no gallops Respiratory: CTA bilaterally, no wheezing, no rhonchi Abdominal: Soft, NT, ND,  bowel sounds + Extremities: no edema, no cyanosis    The results of significant diagnostics from this hospitalization (including imaging, microbiology, ancillary and laboratory) are listed below for reference.     Microbiology: Recent Results (from the past 240 hour(s))  SARS CORONAVIRUS 2 (TAT 6-24 HRS) Nasopharyngeal Nasopharyngeal Swab     Status: None   Collection Time: 05/25/20  1:16 PM   Specimen: Nasopharyngeal Swab  Result Value Ref Range Status   SARS Coronavirus 2 NEGATIVE NEGATIVE Final    Comment: (NOTE) SARS-CoV-2 target nucleic acids are NOT DETECTED.  The SARS-CoV-2 RNA is generally detectable in upper and lower respiratory specimens during the acute phase of infection. Negative results do not preclude SARS-CoV-2 infection, do not rule out co-infections with other pathogens, and should not be used as the sole basis for treatment or other patient management decisions. Negative results must be combined with clinical observations, patient history, and epidemiological information. The expected result is Negative.  Fact Sheet for Patients: SugarRoll.be  Fact Sheet for Healthcare Providers: https://www.woods-mathews.com/  This test is not yet approved or cleared by the [place] FDA and  has been authorized for detection and/or diagnosis of SARS-CoV-2 by FDA under an Emergency Use Authorization (EUA). This EUA will remain  in effect (meaning this test can be used) for the duration of the COVID-19 declaration under Se ction 564(b)(1) of the Act, 21 U.S.C. section 360bbb-3(b)(1), unless the authorization is terminated or revoked sooner.  Performed at [facility], [place] [address], [place], [place] [address]      Labs: BNP (last 3 results) No results for input(s): BNP in the last 8760 hours. Basic Metabolic Panel: Recent Labs  Lab 05/25/20 1351  NA 138  K 4.5  CL 102  CO2 26  GLUCOSE 90  BUN 10   CREATININE 0.96  CALCIUM 9.4   Liver Function Tests: Recent Labs  Lab 05/25/20 1351 05/26/20 0905  AST 36  --   ALT 35  --   ALKPHOS 60  --   BILITOT 1.9* 1.5*  PROT 7.4  --   ALBUMIN 4.3  --    No results for input(s): LIPASE, AMYLASE in the last 168 hours. No results for input(s): AMMONIA in the last 168 hours. CBC: Recent Labs  Lab 05/25/20 1316  WBC 9.9  NEUTROABS 5.3  HGB 15.4  HCT 47.1  MCV 92.9  PLT 220   Cardiac Enzymes: No results for input(s): CKTOTAL, CKMB, CKMBINDEX, TROPONINI in the last 168 hours. BNP: Invalid input(s): POCBNP CBG: Recent Labs  Lab 05/27/20 1629 05/27/20 1725 05/27/20 2142 05/28/20 0740 05/28/20 1246  GLUCAP 182* 128* 130* 113* 138*   D-Dimer No results for input(s): DDIMER in the last 72 hours. Hgb A1c Recent Labs    05/28/20 0423  HGBA1C 6.2*   Lipid Profile Recent Labs    05/28/20 0423  CHOL 119  HDL 31*  LDLCALC 64  TRIG 121  CHOLHDL 3.8   Thyroid function studies No results for input(s): TSH, T4TOTAL, T3FREE, THYROIDAB in the last 72 hours.  Invalid input(s): FREET3 Anemia work up No results for input(s): VITAMINB12, FOLATE, FERRITIN, TIBC, IRON, RETICCTPCT in the last 72 hours. Urinalysis    Component Value Date/Time   APPEARANCEUR Cloudy (A) 01/02/2020 0955   GLUCOSEU Negative 01/02/2020 0955   BILIRUBINUR Negative 01/02/2020 0955   PROTEINUR 1+ (A) 01/02/2020 0955   UROBILINOGEN negative 04/16/2015 1527   NITRITE Negative 01/02/2020 0955   LEUKOCYTESUR Negative 01/02/2020 0955   Sepsis Labs Invalid input(s): PROCALCITONIN,  WBC,  LACTICIDVEN Microbiology Recent Results (from the past 240 hour(s))  SARS CORONAVIRUS 2 (TAT 6-24 HRS) Nasopharyngeal Nasopharyngeal Swab     Status: None   Collection Time: 05/25/20  1:16 PM   Specimen: Nasopharyngeal Swab  Result Value Ref Range Status   SARS Coronavirus 2 NEGATIVE NEGATIVE Final    Comment: (NOTE) SARS-CoV-2 target nucleic acids are NOT  DETECTED.  The SARS-CoV-2 RNA is generally detectable in upper and lower respiratory specimens during the acute phase of infection. Negative results do not preclude SARS-CoV-2 infection, do not rule out co-infections with other pathogens, and should not be used as the sole basis for treatment or other patient management decisions. Negative results must be combined with clinical observations, patient history, and epidemiological information. The expected result is Negative.  Fact Sheet for Patients: SugarRoll.be  Fact Sheet for Healthcare Providers: https://www.woods-mathews.com/  This test is not yet approved or cleared by the [place] FDA and  has been authorized for detection and/or diagnosis of SARS-CoV-2 by FDA under an Emergency Use Authorization (EUA). This EUA will remain  in effect (meaning this test can be used) for the duration of the COVID-19 declaration under Se ction 564(b)(1) of the Act, 21 U.S.C. section 360bbb-3(b)(1), unless the authorization is terminated or revoked sooner.  Performed at [facility], [place] [address], [place], [place] [address]      Time coordinating discharge: 35mns  SIGNED:   JKathie Dike MD  [facility] 05/28/2020, 9:42 PM   If 7PM-7AM, please contact night-coverage [email or web address]

## 2020-05-28 NOTE — Consult Note (Addendum)
Cardiology Consultation:   Patient ID: Daniel Short MRN: [id number]; DOB: Sep 09, 1945  Admit date: 05/27/2020 Date of Consult: 05/28/2020  PCP:  Sharion Balloon, [facility]  Cardiologist:  Rozann Lesches, MD  Advanced Practice Provider:  No care team member to display Electrophysiologist:  None        Patient Profile:   Daniel Short is a 75 y.o. male with a hx of CAD, hx MI, and RCA stent 2003, DM, CVA now admitted for ERCP stent retrieval with Dr. Laural Golden and found to have a fib.  Admitted, who is being seen today for the evaluation of atrial fib at the request of Dr Crissie Sickles.  History of Present Illness:   Daniel Short with above hx and stents to RCA were BMS.  nuc stud in 2018 was low risk.  EKG in October SR with PACs and LAFB.  To clear for GI work up he had nuc study and no ST changes to indicate ischemia.  Small fixed mid to apical inf defect consistent with scar.  EF 48%.  Pt has no hx of atrial fib.    Yesterday presented for removal of stent placed after ERCP with sphincterotomy with removal of large stone.  One stone could not be removed sto stent was placed.  He will need gallbladder removed.  His Biliary stent was removed.  Pt was found to be in atrial fib. He was in a fib prior to procedure but asymptomatic so they proceeded with procedure without complications.  Pt is unaware of atrial fib.  No chest pain.  He has had decreased activity for some time. More DOE and fatigue.  In Nov when biliary stent placed was in SR.     Today no chest pain or SOB. Discussed atrial fib with pt and concern for increased risk for stroke.  He notes he occ has 30 seconds or less of rapid HR but not aware of any currently in atrial fib.    Past Medical History:  Diagnosis Date  . Arthritis   . CAD (coronary artery disease)    Status post proximal/ostial RCA Zeta stents 2003  . Chronic back pain   . Diabetes mellitus without complication ([place])   . Dyspnea    . Ear infection 1979   Right ear due to wax build up  . Headache   . History of stroke 2000   Intracerebral hemorrhage  . Myocardial infarction [facility]) 2003    Past Surgical History:  Procedure Laterality Date  . BILIARY STENT PLACEMENT N/A 02/06/2020   Procedure: BILIARY STENT PLACEMENT;  Surgeon: Rogene Houston, MD;  Location: [facility];  Service: Endoscopy;  Laterality: N/A;  . CARPAL TUNNEL RELEASE Left   . CORONARY STENT PLACEMENT  2005  . ERCP N/A 02/06/2020   Procedure: ENDOSCOPIC RETROGRADE CHOLANGIOPANCREATOGRAPHY (ERCP) with stone extraction;  Surgeon: Rogene Houston, MD;  Location: [facility];  Service: Endoscopy;  Laterality: N/A;  1:15  . HERNIA REPAIR     ventral  . KNEE ARTHROSCOPY Right   . LUMBAR LAMINECTOMY/DECOMPRESSION MICRODISCECTOMY N/A 09/14/2016   Procedure: Decompression L3-5, insitu fusion L4-5;  Surgeon: Melina Schools, MD;  Location: [place];  Service: Orthopedics;  Laterality: N/A;  3 hrs  . LUMBAR LAMINECTOMY/DECOMPRESSION MICRODISCECTOMY N/A 09/17/2016   Procedure: WOUND EXPLORATION, GILL DECOMPRESSION,FAR LATERAL DISCECTOMY LEFT L4-L5, AND EVACUATION OF HEMATOMA;  Surgeon: Melina Schools, MD;  Location: [place];  Service: Orthopedics;  Laterality: N/A;  . SPHINCTEROTOMY N/A 02/06/2020  Procedure: SPHINCTEROTOMY;  Surgeon: Rogene Houston, MD;  Location: [facility];  Service: Endoscopy;  Laterality: N/A;     Home Medications:  Prior to Admission medications   Medication Sig Start Date End Date Taking? Authorizing Provider  allopurinol (ZYLOPRIM) 100 MG tablet Take 1 tablet (100 mg total) by mouth daily. (Needs to be seen before next refill) 06/11/19  Yes Hawks, Christy A, FNP  atorvastatin (LIPITOR) 20 MG tablet Take 1 tablet (20 mg total) by mouth daily. 06/17/19  Yes Hawks, Theador Hawthorne, FNP  blood glucose meter kit and supplies Dispense based on patient and insurance preference. Use up to four times daily as directed. (FOR ICD-10 E10.9, E11.9). 04/12/18  Yes Hawks,  Christy A, FNP  HYDROcodone-acetaminophen (NORCO/VICODIN) 5-325 MG tablet Take 1 tablet by mouth every 6 (six) hours as needed for moderate pain.  02/13/18  Yes [provider]  lisinopril (ZESTRIL) 20 MG tablet Take 1 tablet (20 mg total) by mouth daily. (Needs to be seen before next refill) 06/11/19  Yes Hawks, Theador Hawthorne, FNP  metFORMIN (GLUCOPHAGE-XR) 500 MG 24 hr tablet Take 1 tablet (500 mg total) by mouth daily with breakfast. (Needs to be seen before next refill) 06/11/19  Yes Hawks, Theador Hawthorne, FNP  OneTouch Delica Lancets 14G MISC Use to test blood sugar daily as directed. DX: E11.65 11/13/19  Yes Hawks, Alyse Low A, FNP  Polyethyl Glycol-Propyl Glycol (SYSTANE OP) Place 1 drop into both eyes 2 (two) times daily as needed (dry eyes).   Yes [provider]  Blood Glucose Monitoring Suppl (Sarepta) w/Device KIT Use to test blood sugar daily as directed. DX: E11.65 11/13/19   Evelina Dun A, FNP  glucose blood (ONETOUCH VERIO) test strip Use to test blood sugar daily as directed. DX: E11.65 11/13/19   Sharion Balloon, FNP    Inpatient Medications: Scheduled Meds: . allopurinol  100 mg Oral Daily  . apixaban  5 mg Oral BID  . atorvastatin  20 mg Oral Daily  . insulin aspart  0-15 Units Subcutaneous TID WC  . lisinopril  20 mg Oral Daily  . metFORMIN  500 mg Oral Q breakfast  . metoprolol tartrate  12.5 mg Oral BID  . sodium chloride flush  3 mL Intravenous Q12H   Continuous Infusions: . sodium chloride     PRN Meds: sodium chloride, acetaminophen, HYDROcodone-acetaminophen, ondansetron (ZOFRAN) IV, sodium chloride flush  Allergies:   No Known Allergies  Social History:   Social History   Socioeconomic History  . Marital status: Married    Spouse name: Not on file  . Number of children: Not on file  . Years of education: Not on file  . Highest education level: Not on file  Occupational History  . Not on file  Tobacco Use  . Smoking status:  Former Smoker    Packs/day: 0.25    Years: 54.00    Pack years: 13.50    Types: Cigarettes    Quit date: 08/27/2015    Years since quitting: 4.7  . Smokeless tobacco: Never Used  Vaping Use  . Vaping Use: Never used  Substance and Sexual Activity  . Alcohol use: No  . Drug use: No  . Sexual activity: Yes  Other Topics Concern  . Not on file  Social History Narrative  . Not on file   Social Determinants of Health   Financial Resource Strain: Not on file  Food Insecurity: Not on file  Transportation Needs: Not on file  Physical Activity: Not on file  Stress: Not on file  Social Connections: Not on file  Intimate Partner Violence: Not on file    Family History:    Family History  Problem Relation Age of Onset  . Heart attack Mother   . Heart attack Father   . COPD Father   . Heart attack Brother   . Colon cancer Neg Hx      ROS:  Please see the history of present illness.  General:no colds or fevers, no weight changes Skin:no rashes or ulcers HEENT:no blurred vision, no congestion CV:see HPI PUL:see HPI GI:no diarrhea constipation or melena, no indigestion GU:no hematuria, no dysuria MS:no joint pain, no claudication Neuro:no syncope, no lightheadedness Endo:+ diabetes, no thyroid disease  All other ROS reviewed and negative.     Physical Exam/Data:   Vitals:   05/27/20 1645 05/27/20 1712 05/27/20 1717 05/27/20 2140  BP: 127/68 125/82 125/82 126/73  Pulse: 84 67 67 72  Resp: 16 18 18 20   Temp:  98.2 F (36.8 C) 98.2 F (36.8 C) 98.8 F (37.1 C)  TempSrc:  Oral Oral Oral  SpO2: 95% 99%  98%  Weight:   94.9 kg   Height:   5' 7.5" (1.715 m)     Intake/Output Summary (Last 24 hours) at 05/28/2020 7902 Last data filed at 05/28/2020 0836 Gross per 24 hour  Intake 1923 ml  Output 0 ml  Net 1923 ml   Last 3 Weights 05/27/2020 05/25/2020 05/11/2020  Weight (lbs) 209 lb 3.5 oz 195 lb 200 lb  Weight (kg) 94.9 kg 88.451 kg 90.719 kg     Body mass index is  32.28 kg/m.  General:  Well nourished, well developed, in no acute distress HEENT: normal Lymph: no adenopathy Neck: no JVD Endocrine:  No thryomegaly Vascular: No carotid bruits; FA pulses 2+ bilaterally without bruits  Cardiac:  Mildly irregular; no murmur gallup rub or click Lungs:  clear to auscultation bilaterally, no wheezing, rhonchi or rales  Abd: soft, nontender, no hepatomegaly  Ext: no edema Musculoskeletal:  No deformities, BUE and BLE strength normal and equal Skin: warm and dry  Neuro:  Alert and oriented X 3 MAE follows commands, no focal abnormalities noted Psych:  Normal affect   EKG:  The EKG was personally reviewed and demonstrates:  Atrial fib rate controlled  Telemetry:  Telemetry was personally reviewed and demonstrates:  A fib rate controlled   Relevant CV Studies: nuc study 12/2019 neg for ischemia+ scar.  Laboratory Data:  High Sensitivity Troponin:  No results for input(s): TROPONINIHS in the last 720 hours.   Chemistry Recent Labs  Lab 05/25/20 1351  NA 138  K 4.5  CL 102  CO2 26  GLUCOSE 90  BUN 10  CREATININE 0.96  CALCIUM 9.4  GFRNONAA >60  ANIONGAP 10    Recent Labs  Lab 05/25/20 1351 05/26/20 0905  PROT 7.4  --   ALBUMIN 4.3  --   AST 36  --   ALT 35  --   ALKPHOS 60  --   BILITOT 1.9* 1.5*   Hematology Recent Labs  Lab 05/25/20 1316  WBC 9.9  RBC 5.07  HGB 15.4  HCT 47.1  MCV 92.9  MCH 30.4  MCHC 32.7  RDW 12.4  PLT 220   BNPNo results for input(s): BNP, PROBNP in the last 168 hours.  DDimer No results for input(s): DDIMER in the last 168 hours.   Radiology/Studies:  DG ERCP  Result Date:  05/27/2020 CLINICAL DATA:  History of choledocholithiasis and prior biliary stenting. EXAM: ERCP TECHNIQUE: Multiple spot images obtained with the fluoroscopic device and submitted for interpretation post-procedure. COMPARISON:  02/06/2020 FINDINGS: Imaging obtained with a C-arm demonstrates removal of a plastic endoscopic  common bile duct stent. Cholangiogram demonstrates irregularity of the common bile duct without high-grade stricture or visible filling defect. IMPRESSION: After removal of an indwelling common bile duct biliary stent, cholangiogram demonstrates some mild irregularity of the common bile duct without filling defect or high-grade stricture. These images were submitted for radiologic interpretation only. Please see the procedural report for the amount of contrast and the fluoroscopy time utilized. Electronically Signed   By: Aletta Edouard M.D.   On: 05/27/2020 16:39     Assessment and Plan:   1. Atrial fib rate controlled, pt unaware of irregular rhythm. Doing procedure HR 80-100s. Now 63.  Discussed with Dr. Harl Bowie add lopressor 12.5 BID and eliquis 5 mg BID.  Ok from GI to anticoagulate.  Check echo.  Plan per Dr. Harl Bowie, 3 weeks anticoagulation then DCCV though still with need for cholecystectomy.   CHA2DS2VASc of 5 . 2. Chololithiasis will need cholecystectomy.  3. CAD with BMS stents placed in 2003 and has been on ASA. Now on eliquis would hold ASA.  last nuc study in 12/2019 neg for ischemia and no chest pain.  4. Hx CVA with intracranial hemorrhage in 2000 5. DM per IM  6. HTN  Controlled continue meds  7. HLD on statin, continue   Risk Assessment/Risk Scores:          CHA2DS2-VASc Score = 5  This indicates a 7.2% annual risk of stroke. The patient's score is based upon: CHF History: No HTN History: Yes Diabetes History: Yes Stroke History: No Vascular Disease History: Yes Age Score: 2 Gender Score: 0         For questions or updates, please contact [place] Please consult [email or web address] for contact info under    Signed, Cecilie Kicks, NP  05/28/2020 9:22 AM Attending note  Patient seen and discussed with NP Dorene Ar, I agree with her documentation. History of CAD with prior stenting as reported above, Dm2, CVA, bile stones s/p stenting. Cardiology consulted due to  new onset of afib at the time of ERCP yesterday. From notes afib rates 80s-100s yesterday. Reports some occasoinal palpitations.       New diagnosis of afib. Start lopressor 12.6m bid. Already started on eliquis 535mbid,from GI note there is no contraindication to anticoagulation. We will get echo this AM, will be able to discharge later this morning/early afternoon. F/u 3-4 weeks, if still in afib could consider possible trial of DCCV after 3 weeks of anticoag   JoCarlyle DollyD

## 2020-05-29 ENCOUNTER — Telehealth: Payer: Self-pay

## 2020-05-29 NOTE — Telephone Encounter (Signed)
Contact Date: 05/29/2020 Contacted By: Pricilla Riffle, LPN  Transition Care Management Follow-up Telephone Call  Date of discharge and from where: 05/28/20  Discharge Diagnosis:Calculus of bile duct without cholangitis, cholecystitis without obstruction.  How have you been since you were released from the hospital? Well  Any questions or concerns? No   Items Reviewed:  Did the pt receive and understand the discharge instructions provided? Yes   Medications obtained and verified? Yes   Any new allergies since your discharge? No   Dietary orders reviewed? Yes  Do you have support at home? Yes   Discontinued Medications none New Medications Added Lopressor, Eliquis  Current Medication List Allergies as of 05/29/2020   No Known Allergies     Medication List       Accurate as of May 29, 2020  9:56 AM. If you have any questions, ask your nurse or doctor.        allopurinol 100 MG tablet Commonly known as: ZYLOPRIM Take 1 tablet (100 mg total) by mouth daily. (Needs to be seen before next refill)   apixaban 5 MG Tabs tablet Commonly known as: ELIQUIS Take 1 tablet (5 mg total) by mouth 2 (two) times daily.   atorvastatin 20 MG tablet Commonly known as: LIPITOR Take 1 tablet (20 mg total) by mouth daily.   blood glucose meter kit and supplies Dispense based on patient and insurance preference. Use up to four times daily as directed. (FOR ICD-10 E10.9, E11.9).   HYDROcodone-acetaminophen 5-325 MG tablet Commonly known as: NORCO/VICODIN Take 1 tablet by mouth every 6 (six) hours as needed for moderate pain.   lisinopril 20 MG tablet Commonly known as: ZESTRIL Take 1 tablet (20 mg total) by mouth daily. (Needs to be seen before next refill)   metFORMIN 500 MG 24 hr tablet Commonly known as: GLUCOPHAGE-XR Take 1 tablet (500 mg total) by mouth daily with breakfast. (Needs to be seen before next refill)   metoprolol tartrate 25 MG tablet Commonly known as:  LOPRESSOR Take 0.5 tablets (12.5 mg total) by mouth 2 (two) times daily.   OneTouch Delica Lancets 36U Misc Use to test blood sugar daily as directed. DX: E11.65   OneTouch Verio Flex System w/Device Kit Use to test blood sugar daily as directed. DX: E11.65   OneTouch Verio test strip Generic drug: glucose blood Use to test blood sugar daily as directed. DX: E11.65   SYSTANE OP Place 1 drop into both eyes 2 (two) times daily as needed (dry eyes).        Home Care and Equipment/Supplies: Were home health services ordered? no If so, what is the name of the agency? N/A  Has the agency set up a time to come to the patient's home? not applicable Were any new equipment or medical supplies ordered?  No What is the name of the medical supply agency? N/A Were you able to get the supplies/equipment? not applicable Do you have any questions related to the use of the equipment or supplies? No  Functional Questionnaire: (I = Independent and D = Dependent) ADLs: I  Bathing/Dressing- I  Meal Prep- I  Eating- I  Maintaining continence- I  Transferring/Ambulation- I  Managing Meds- I  Follow up appointments reviewed:   PCP Hospital f/u appt confirmed? Yes  Scheduled to see Hawks on 06/09/20 @ 3:55pm.  [facility] f/u [facility]t confirmed? Yes  Scheduled to see Cardio on 06/16/20 .  Are transportation arrangements needed? No   If their condition worsens, is  the pt aware to call PCP or go to the Emergency Dept.? Yes  Was the patient provided with contact information for the PCP's office or ED? Yes  Was to pt encouraged to call back with questions or concerns? Yes

## 2020-06-01 ENCOUNTER — Telehealth (INDEPENDENT_AMBULATORY_CARE_PROVIDER_SITE_OTHER): Payer: Self-pay | Admitting: *Deleted

## 2020-06-01 NOTE — Telephone Encounter (Signed)
Noted, I spoke two times with the wife in the past regarding the need to perform cholecystectomy to prevent more episodes of choledocholithiasis but she has been reluctant to proceed with the cholecystectomy referral.

## 2020-06-01 NOTE — Telephone Encounter (Signed)
Spoke to receptionist at [facility] to get update on referral - they spoke to patient's wife and per her "Daniel Short had gallstones removed by GI doctor through endoscopic procedure and the other doctor jumped the gun on referring him to surgeon. She told them they didn't need to see the surgeon."

## 2020-06-02 NOTE — Telephone Encounter (Signed)
Will hold off referral for cholecystectomy.

## 2020-06-03 ENCOUNTER — Ambulatory Visit (INDEPENDENT_AMBULATORY_CARE_PROVIDER_SITE_OTHER): Payer: Medicare HMO | Admitting: Family

## 2020-06-03 ENCOUNTER — Other Ambulatory Visit: Payer: Self-pay

## 2020-06-03 ENCOUNTER — Encounter: Payer: Self-pay | Admitting: Family

## 2020-06-03 VITALS — BP 140/74 | HR 63 | Temp 97.3°F | Ht 67.0 in | Wt 205.4 lb

## 2020-06-03 DIAGNOSIS — H0012 Chalazion right lower eyelid: Secondary | ICD-10-CM

## 2020-06-03 DIAGNOSIS — I2510 Atherosclerotic heart disease of native coronary artery without angina pectoris: Secondary | ICD-10-CM | POA: Diagnosis not present

## 2020-06-03 DIAGNOSIS — I152 Hypertension secondary to endocrine disorders: Secondary | ICD-10-CM | POA: Diagnosis not present

## 2020-06-03 DIAGNOSIS — E785 Hyperlipidemia, unspecified: Secondary | ICD-10-CM | POA: Diagnosis not present

## 2020-06-03 DIAGNOSIS — E1159 Type 2 diabetes mellitus with other circulatory complications: Secondary | ICD-10-CM | POA: Diagnosis not present

## 2020-06-03 DIAGNOSIS — E1169 Type 2 diabetes mellitus with other specified complication: Secondary | ICD-10-CM

## 2020-06-03 DIAGNOSIS — Z09 Encounter for follow-up examination after completed treatment for conditions other than malignant neoplasm: Secondary | ICD-10-CM

## 2020-06-03 DIAGNOSIS — I4891 Unspecified atrial fibrillation: Secondary | ICD-10-CM

## 2020-06-03 DIAGNOSIS — I25119 Atherosclerotic heart disease of native coronary artery with unspecified angina pectoris: Secondary | ICD-10-CM | POA: Diagnosis not present

## 2020-06-03 DIAGNOSIS — E119 Type 2 diabetes mellitus without complications: Secondary | ICD-10-CM

## 2020-06-03 DIAGNOSIS — E118 Type 2 diabetes mellitus with unspecified complications: Secondary | ICD-10-CM

## 2020-06-03 DIAGNOSIS — M109 Gout, unspecified: Secondary | ICD-10-CM

## 2020-06-03 LAB — BAYER DCA HB A1C WAIVED: HB A1C (BAYER DCA - WAIVED): 6.1 % (ref <7.0)

## 2020-06-03 MED ORDER — METOPROLOL TARTRATE 25 MG PO TABS: 12.5000 mg | ORAL_TABLET | Freq: Two times a day (BID) | ORAL | 1 refills | Status: DC

## 2020-06-03 MED ORDER — METFORMIN HCL ER 500 MG PO TB24: 500.0000 mg | ORAL_TABLET | Freq: Every day | ORAL | 3 refills | Status: DC

## 2020-06-03 MED ORDER — ALLOPURINOL 100 MG PO TABS
100.0000 mg | ORAL_TABLET | Freq: Every day | ORAL | 3 refills | Status: DC
Start: 2020-06-03 — End: 2021-08-10

## 2020-06-03 MED ORDER — METFORMIN HCL ER 500 MG PO TB24
500.0000 mg | ORAL_TABLET | Freq: Every day | ORAL | 3 refills | Status: DC
Start: 2020-06-03 — End: 2020-06-03

## 2020-06-03 MED ORDER — ATORVASTATIN CALCIUM 20 MG PO TABS: 20.0000 mg | ORAL_TABLET | Freq: Every day | ORAL | 3 refills | Status: DC

## 2020-06-03 MED ORDER — APIXABAN 5 MG PO TABS: 5.0000 mg | ORAL_TABLET | Freq: Two times a day (BID) | ORAL | 1 refills | Status: DC

## 2020-06-03 MED ORDER — LISINOPRIL 20 MG PO TABS: 20.0000 mg | ORAL_TABLET | Freq: Every day | ORAL | 3 refills | Status: DC

## 2020-06-03 MED ORDER — LISINOPRIL 20 MG PO TABS
20.0000 mg | ORAL_TABLET | Freq: Every day | ORAL | 3 refills | Status: DC
Start: 2020-06-03 — End: 2021-08-10

## 2020-06-03 NOTE — Patient Instructions (Signed)

## 2020-06-03 NOTE — Progress Notes (Signed)
Subjective:    Patient ID: Daniel Short, male    DOB: 10/22/1945, 75 y.o.   MRN: [id number]  Chief Complaint  Patient presents with  . Hospitalization Follow-up    HPI Today's visit was for Transitional Care Management.  The patient was discharged from [facility] on 05/28/20 with a primary diagnosis of ERCP stent retrieval procedure.  A Fib.   Contact with the patient and/or caregiver, by a clinical staff member, was made on 05/29/20 and was documented as a telephone encounter within the EMR.  Through chart review and discussion with the patient I have determined that management of their condition is of moderate complexity.   PT presented to the ED on 05/27/20 for ERCP stent retrieval . He has this procedure with no problems, but post op he developed A Fib. ECHO that was normal. He was started on metoprolol 12.5 mg BID and Eliquis 5 mg BID. He has a follow up with Cardiologists on 06/16/20.   States he is feeling fatigued.   He also has a chalazion on right lower lid. He has an appointment with plastic surgery, but needs a referral placed today.       Review of Systems  Constitutional: Positive for fatigue.  All other systems reviewed and are negative.      Objective:   Physical Exam Vitals reviewed.  Constitutional:      General: He is not in acute distress.    Appearance: He is well-developed and well-nourished.  HENT:     Head: Normocephalic.     Right Ear: External ear normal.     Left Ear: External ear normal.     Mouth/Throat:     Mouth: Oropharynx is clear and moist.  Eyes:     General:        Right eye: No discharge.        Left eye: No discharge.     Pupils: Pupils are equal, round, and reactive to light.      Comments: Chalazion right lower lid  Neck:     Thyroid: No thyromegaly.  Cardiovascular:     Rate and Rhythm: Normal rate and regular rhythm.     Pulses: Intact distal pulses.     Heart sounds: Normal heart sounds. No murmur  heard.   Pulmonary:     Effort: Pulmonary effort is normal. No respiratory distress.     Breath sounds: Wheezing present.  Abdominal:     General: Bowel sounds are normal. There is no distension.     Palpations: Abdomen is soft.     Tenderness: There is no abdominal tenderness.  Musculoskeletal:        General: No tenderness or edema. Normal range of motion.     Cervical back: Normal range of motion and neck supple.  Skin:    General: Skin is warm and dry.     Findings: No erythema or rash.  Neurological:     Mental Status: He is alert and oriented to person, place, and time.     Cranial Nerves: No cranial nerve deficit.     Deep Tendon Reflexes: Reflexes are normal and symmetric.  Psychiatric:        Mood and Affect: Mood and affect normal.        Behavior: Behavior normal.        Thought Content: Thought content normal.        Judgment: Judgment normal.       BP 140/74   Pulse 63  Temp (!) 97.3 F (36.3 C) (Temporal)   Ht _0  (1.702 m)   Wt 205 lb 6.4 oz (93.2 kg)   BMI 32.17 kg/m      Assessment & Plan:  Daniel Short comes in today with chief complaint of Hospitalization Follow-up   Diagnosis and orders addressed:  1. Hypertension associated with diabetes ([place]) - lisinopril (ZESTRIL) 20 MG tablet; Take 1 tablet (20 mg total) by mouth daily. (Needs to be seen before next refill)  Dispense: 90 tablet; Refill: 3 - CBC with Differential/Platelet - CMP14+EGFR  2. Type 2 diabetes mellitus with complication, without long-term current use of insulin ([facility]) - metFORMIN (GLUCOPHAGE-XR) 500 MG 24 hr tablet; Take 1 tablet (500 mg total) by mouth daily with breakfast. (Needs to be seen before next refill)  Dispense: 90 tablet; Refill: 3 - Bayer DCA Hb A1c Waived - CBC with Differential/Platelet - CMP14+EGFR  3. Chalazion of right lower eyelid - Ambulatory referral to Plastic Surgery - CBC with Differential/Platelet - CMP14+EGFR  4. Hospital discharge  follow-up - CBC with Differential/Platelet - CMP14+EGFR  5. Coronary artery disease involving native coronary artery of native heart without angina pectoris - CBC with Differential/Platelet - CMP14+EGFR  6. Atrial fibrillation with RVR (HCC) - CBC with Differential/Platelet - CMP14+EGFR  7. Hyperlipidemia associated with type 2 diabetes mellitus ([place]) - CBC with Differential/Platelet - CMP14+EGFR  8. Type 2 diabetes mellitus without complication, without long-term current use of insulin (HCC) - CBC with Differential/Platelet - CMP14+EGFR  9. Morbid obesity ([place]) - CBC with Differential/Platelet - CMP14+EGFR  10. Coronary artery disease involving native coronary artery of native heart with angina pectoris ([place]) - CBC with Differential/Platelet - CMP14+EGFR   Labs pending Health Maintenance reviewed Diet and exercise encouraged  Follow up plan: 3 months   Evelina Dun, FNP

## 2020-06-04 LAB — CMP14+EGFR
ALT: 23 IU/L (ref 0–44)
AST: 23 IU/L (ref 0–40)
Albumin/Globulin Ratio: 1.9 (ref 1.2–2.2)
Albumin: 4.3 g/dL (ref 3.7–4.7)
Alkaline Phosphatase: 73 IU/L (ref 44–121)
BUN/Creatinine Ratio: 8 — ABNORMAL LOW (ref 10–24)
BUN: 9 mg/dL (ref 8–27)
Bilirubin Total: 2.6 mg/dL — ABNORMAL HIGH (ref 0.0–1.2)
CO2: 21 mmol/L (ref 20–29)
Calcium: 9.7 mg/dL (ref 8.6–10.2)
Chloride: 100 mmol/L (ref 96–106)
Creatinine, Ser: 1.15 mg/dL (ref 0.76–1.27)
Globulin, Total: 2.3 g/dL (ref 1.5–4.5)
Glucose: 112 mg/dL — ABNORMAL HIGH (ref 65–99)
Potassium: 4.7 mmol/L (ref 3.5–5.2)
Sodium: 140 mmol/L (ref 134–144)
Total Protein: 6.6 g/dL (ref 6.0–8.5)
eGFR: 66 mL/min/{1.73_m2} (ref >59)

## 2020-06-04 LAB — CBC WITH DIFFERENTIAL/PLATELET
Basophils Absolute: 0.1 10*3/uL (ref 0.0–0.2)
Basos: 1 %
EOS (ABSOLUTE): 0.1 10*3/uL (ref 0.0–0.4)
Eos: 2 %
Hematocrit: 44.4 % (ref 37.5–51.0)
Hemoglobin: 15.4 g/dL (ref 13.0–17.7)
Immature Grans (Abs): 0 10*3/uL (ref 0.0–0.1)
Immature Granulocytes: 0 %
Lymphocytes Absolute: 2.5 10*3/uL (ref 0.7–3.1)
Lymphs: 30 %
MCH: 31.1 pg (ref 26.6–33.0)
MCHC: 34.7 g/dL (ref 31.5–35.7)
MCV: 90 fL (ref 79–97)
Monocytes Absolute: 0.9 10*3/uL (ref 0.1–0.9)
Monocytes: 11 %
Neutrophils Absolute: 4.9 10*3/uL (ref 1.4–7.0)
Neutrophils: 56 %
Platelets: 216 10*3/uL (ref 150–450)
RBC: 4.95 x10E6/uL (ref 4.14–5.80)
RDW: 12.1 % (ref 11.6–15.4)
WBC: 8.6 10*3/uL (ref 3.4–10.8)

## 2020-06-09 ENCOUNTER — Ambulatory Visit: Payer: Medicare HMO | Admitting: Family

## 2020-06-10 NOTE — Progress Notes (Signed)
Cardiology Office Note    Date:  06/16/2020   ID:  Emersyn, Kotarski 03/03/1946, MRN [id number]   PCP:  Sharion Balloon, [place]  Cardiologist:  Rozann Lesches, MD   Advanced Practice Provider:  No care team member to display Electrophysiologist:  None   [id number]}   Chief Complaint  Patient presents with  . Hospitalization Follow-up    History of Present Illness:  Daniel Short is a 75 y.o. male CAD status post BMS x2 to the ostial and proximal RCA in 2003. Follow-up Lexiscan Myoview in 2018 was low risk.  Last saw Dr. Domenic Polite 01/09/20 for preop clearance for ERCP. NST 12/2019 low risk no ischemia, scar EF 48%.  Patient went into Afib with RVR during ERCP 05/28/2020  Echo LVEF 50% mild LVH. Started on metoprolol 12.5 mg bid and eliquis with plans for DCCV after 3 weeks anticoagulation.  Patient comes in for f/u. Never felt Afib. In NSR today. Has chronic DOE unchanged.  Denies chest pain, palpitations, edema. No regular exercise because of chronic back and knee problems.      Past Medical History:  Diagnosis Date  . Arthritis   . CAD (coronary artery disease)    Status post proximal/ostial RCA Zeta stents 2003  . Chronic back pain   . Diabetes mellitus without complication ([place])   . Dyspnea   . Ear infection 1979   Right ear due to wax build up  . Headache   . History of stroke 2000   Intracerebral hemorrhage  . Myocardial infarction [facility]) 2003    Past Surgical History:  Procedure Laterality Date  . BILIARY STENT PLACEMENT N/A 02/06/2020   Procedure: BILIARY STENT PLACEMENT;  Surgeon: Rogene Houston, MD;  Location: [facility];  Service: Endoscopy;  Laterality: N/A;  . CARPAL TUNNEL RELEASE Left   . CORONARY STENT PLACEMENT  2005  . ERCP N/A 02/06/2020   Procedure: ENDOSCOPIC RETROGRADE CHOLANGIOPANCREATOGRAPHY (ERCP) with stone extraction;  Surgeon: Rogene Houston, MD;  Location: [facility];  Service: Endoscopy;   Laterality: N/A;  1:15  . ERCP N/A 05/27/2020   Procedure: ENDOSCOPIC RETROGRADE CHOLANGIOPANCREATOGRAPHY (ERCP) WITH PROPOFOL;  Surgeon: Rogene Houston, MD;  Location: [facility];  Service: Endoscopy;  Laterality: N/A;  . GASTROINTESTINAL STENT REMOVAL N/A 05/27/2020   Procedure: STENT REMOVAL;  Surgeon: Rogene Houston, MD;  Location: [facility];  Service: Endoscopy;  Laterality: N/A;  . HERNIA REPAIR     ventral  . KNEE ARTHROSCOPY Right   . LUMBAR LAMINECTOMY/DECOMPRESSION MICRODISCECTOMY N/A 09/14/2016   Procedure: Decompression L3-5, insitu fusion L4-5;  Surgeon: Melina Schools, MD;  Location: [place];  Service: Orthopedics;  Laterality: N/A;  3 hrs  . LUMBAR LAMINECTOMY/DECOMPRESSION MICRODISCECTOMY N/A 09/17/2016   Procedure: WOUND EXPLORATION, GILL DECOMPRESSION,FAR LATERAL DISCECTOMY LEFT L4-L5, AND EVACUATION OF HEMATOMA;  Surgeon: Melina Schools, MD;  Location: [place];  Service: Orthopedics;  Laterality: N/A;  . SPHINCTEROTOMY N/A 02/06/2020   Procedure: SPHINCTEROTOMY;  Surgeon: Rogene Houston, MD;  Location: [facility];  Service: Endoscopy;  Laterality: N/A;    Current Medications: Current Meds  Medication Sig  . allopurinol (ZYLOPRIM) 100 MG tablet Take 1 tablet (100 mg total) by mouth daily. (Needs to be seen before next refill)  . apixaban (ELIQUIS) 5 MG TABS tablet Take 1 tablet (5 mg total) by mouth 2 (two) times daily.  Marland Kitchen atorvastatin (LIPITOR) 20 MG tablet Take 1 tablet (20 mg total) by mouth daily.  Marland Kitchen  blood glucose meter kit and supplies Dispense based on patient and insurance preference. Use up to four times daily as directed. (FOR ICD-10 E10.9, E11.9).  Marland Kitchen Blood Glucose Monitoring Suppl (ONETOUCH VERIO FLEX SYSTEM) w/Device KIT Use to test blood sugar daily as directed. DX: E11.65  . glucose blood (ONETOUCH VERIO) test strip Use to test blood sugar daily as directed. DX: E11.65  . HYDROcodone-acetaminophen (NORCO/VICODIN) 5-325 MG tablet Take 1 tablet by mouth every 6 (six) hours as  needed for moderate pain.   Marland Kitchen lisinopril (ZESTRIL) 20 MG tablet Take 1 tablet (20 mg total) by mouth daily. (Needs to be seen before next refill)  . metFORMIN (GLUCOPHAGE-XR) 500 MG 24 hr tablet Take 1 tablet (500 mg total) by mouth daily with breakfast. (Needs to be seen before next refill)  . metoprolol tartrate (LOPRESSOR) 25 MG tablet Take 0.5 tablets (12.5 mg total) by mouth 2 (two) times daily.  Glory Rosebush Delica Lancets 60Y MISC Use to test blood sugar daily as directed. DX: E11.65  . Polyethyl Glycol-Propyl Glycol (SYSTANE OP) Place 1 drop into both eyes 2 (two) times daily as needed (dry eyes).     Allergies:   Patient has no known allergies.   Social History   Socioeconomic History  . Marital status: Married    Spouse name: Not on file  . Number of children: Not on file  . Years of education: Not on file  . Highest education level: Not on file  Occupational History  . Not on file  Tobacco Use  . Smoking status: Former Smoker    Packs/day: 0.25    Years: 54.00    Pack years: 13.50    Types: Cigarettes    Quit date: 08/27/2015    Years since quitting: 4.8  . Smokeless tobacco: Never Used  Vaping Use  . Vaping Use: Never used  Substance and Sexual Activity  . Alcohol use: No  . Drug use: No  . Sexual activity: Yes  Other Topics Concern  . Not on file  Social History Narrative  . Not on file   Social Determinants of Health   Financial Resource Strain: Not on file  Food Insecurity: Not on file  Transportation Needs: Not on file  Physical Activity: Not on file  Stress: Not on file  Social Connections: Not on file     Family History:  The patient's family history includes COPD in his father; Heart attack in his brother, father, and mother.   ROS:   Please see the history of present illness.    ROS All other systems reviewed and are negative.   PHYSICAL EXAM:   VS:  BP 118/76   Pulse 70   Ht 5' 7" (1.702 m)   Wt 204 lb (92.5 kg)   SpO2 96%   BMI 31.95  kg/m   Physical Exam  GEN: Obese, in no acute distress  Neck: no JVD, carotid bruits, or masses Cardiac:RRR; no murmurs, rubs, or gallops  Respiratory:  clear to auscultation bilaterally, normal work of breathing GI: soft, nontender, nondistended, + BS Ext: without cyanosis, clubbing, or edema, Good distal pulses bilaterally Neuro:  Alert and Oriented x 3 Psych: euthymic mood, full affect  Wt Readings from Last 3 Encounters:  06/16/20 204 lb (92.5 kg)  06/03/20 205 lb 6.4 oz (93.2 kg)  05/27/20 209 lb 3.5 oz (94.9 kg)      Studies/Labs Reviewed:   EKG:  EKG is  ordered today.  The ekg ordered today demonstrates  NSR left axis, no change  Recent Labs: 06/03/2020: ALT 23; BUN 9; Creatinine, Ser 1.15; Hemoglobin 15.4; Platelets 216; Potassium 4.7; Sodium 140   Lipid Panel    Component Value Date/Time   CHOL 119 05/28/2020 0423   CHOL 101 12/19/2019 1026   TRIG 121 05/28/2020 0423   HDL 31 (L) 05/28/2020 0423   HDL 32 (L) 12/19/2019 1026   CHOLHDL 3.8 05/28/2020 0423   VLDL 24 05/28/2020 0423   LDLCALC 64 05/28/2020 0423   LDLCALC 49 12/19/2019 1026   LDLDIRECT 82 05/19/2016 1616    Additional studies/ records that were reviewed today include:  Echo 05/28/20 IMPRESSIONS     1. Left ventricular ejection fraction, by estimation, is 50%. The left  ventricle has low normal function. Left ventricular endocardial border not  optimally defined to evaluate regional wall motion. There is mild left  ventricular hypertrophy. Left  ventricular diastolic parameters are indeterminate.   2. Right ventricular systolic function is normal. The right ventricular  size is normal.   3. The mitral valve is normal in structure. No evidence of mitral valve  regurgitation. No evidence of mitral stenosis.   4. The aortic valve was not well visualized. Aortic valve regurgitation  is not visualized. No aortic stenosis is present.   5. The inferior vena cava is normal in size with greater than 50%   respiratory variability, suggesting right atrial pressure of 3 mmHg.     Risk Assessment/Calculations:    CHA2DS2-VASc Score = 5  This indicates a 7.2% annual risk of stroke. The patient's score is based upon: CHF History: No HTN History: Yes Diabetes History: Yes Stroke History: No Vascular Disease History: Yes Age Score: 2 Gender Score: 0        ASSESSMENT:    1. Paroxysmal atrial fibrillation (HCC)   2. Coronary artery disease involving native coronary artery of native heart without angina pectoris   3. Hyperlipidemia, unspecified hyperlipidemia type   4. Essential hypertension      PLAN:  In order of problems listed above:  Afib with RVR new onset in the setting of ERCP started on metoprolol and eliquis. Plan for DCCV after 3 weeks anticoagulation but patient in NSR today. Asymptomatic with Afib. Will place monitor to see if he is going in/out of afib. He has missed some metoprolol and eliquis and not taking 12 hrs apart. Have gone over this in detail. Needs lower lid eye surgery 07/24/20. Patient will ask if he needs to come off Eliquis for this. Notes don't indicate one way or the other.  CAD BMS ostial and prox RCA 2003, low risk lexiscan 12/2019 no angina  HLD LDL 64 05/28/20  HTN BP well controlled  Shared Decision Making/Informed Consent        Medication Adjustments/Labs and Tests Ordered: Current medicines are reviewed at length with the patient today.  Concerns regarding medicines are outlined above.  Medication changes, Labs and Tests ordered today are listed in the Patient Instructions below. Patient Instructions  Medication Instructions:  Your physician recommends that you continue on your current medications as directed. Please refer to the Current Medication list given to you today.  *If you need a refill on your cardiac medications before your next appointment, please call your pharmacy*   Lab Work: None today  If you have labs (blood work)  drawn today and your tests are completely normal, you will receive your results only by: Marland Kitchen MyChart Message (if you have MyChart) OR . A paper  copy in the mail If you have any lab test that is abnormal or we need to change your treatment, we will call you to review the results.   Testing/Procedures: [place]n [place]- Long Term Monitor Instructions   Your physician has requested you wear your ZIO patch monitor__14_____days.   This is a single patch monitor.  [employer] supplies one patch monitor per enrollment.  Additional stickers are not available.    While looking in a mirror, press and release button in center of patch.  A small green light will flash 3-4 times .  This will be your only indicator the monitor has been turned on.     Do not shower for the first 24 hours.  You may shower after the first 24 hours.   Press button if you feel a symptom. You will hear a small click.  Record Date, Time and Symptom in the Patient Log Book.   When you are ready to remove patch, follow instructions on last 2 pages of Patient Log Book.  Stick patch monitor onto last page of Patient Log Book.   Place Patient Log Book in [place] box.  Use locking tab on box and tape box closed securely.  The [employer] has [employer] on it.  Please place in mailbox as soon as possible.  Your physician should have your test results approximately 7 days after the monitor has been mailed back to [facility].   Call [place] at [phone] if you have questions regarding your ZIO XT patch monitor.  Call them immediately if you see an orange light blinking on your monitor.   If your monitor falls off in less than 4 days contact our Monitor department at [phone].  If your monitor becomes loose or falls off after 4 days call [employer] at [phone] for suggestions on securing your monitor.     Follow-Up: At [facility], you and your health needs are our priority.  As part of our  continuing mission to provide you with exceptional heart care, we have created designated Provider Care Teams.  These Care Teams include your primary Cardiologist (physician) and Advanced Practice Providers (APPs -  Physician Assistants and Nurse Practitioners) who all work together to provide you with the care you need, when you need it.  We recommend signing up for the patient portal called "MyChart".  Sign up information is provided on this After Visit Summary.  MyChart is used to connect with patients for Virtual Visits (Telemedicine).  Patients are able to view lab/test results, encounter notes, upcoming appointments, etc.  Non-urgent messages can be sent to your provider as well.   To learn more about what you can do with MyChart, go to [email or web address].    Your next appointment:   After monitor is finished  The format for your next appointment:   In Person  Provider:   Rozann Lesches, MD   Other Instructions None   Thank you for choosing [facility] !            Sumner Boast, PA-C  06/16/2020 12:28 PM    [facility] [address], [place], [place]  [address] Phone: [phone]; Fax: [phone]

## 2020-06-11 ENCOUNTER — Telehealth: Payer: Self-pay

## 2020-06-12 DIAGNOSIS — D485 Neoplasm of uncertain behavior of skin: Secondary | ICD-10-CM | POA: Diagnosis not present

## 2020-06-12 DIAGNOSIS — H02834 Dermatochalasis of left upper eyelid: Secondary | ICD-10-CM | POA: Diagnosis not present

## 2020-06-12 DIAGNOSIS — H02135 Senile ectropion of left lower eyelid: Secondary | ICD-10-CM | POA: Diagnosis not present

## 2020-06-12 DIAGNOSIS — H02413 Mechanical ptosis of bilateral eyelids: Secondary | ICD-10-CM | POA: Diagnosis not present

## 2020-06-12 DIAGNOSIS — H02831 Dermatochalasis of right upper eyelid: Secondary | ICD-10-CM | POA: Diagnosis not present

## 2020-06-12 DIAGNOSIS — H02132 Senile ectropion of right lower eyelid: Secondary | ICD-10-CM | POA: Diagnosis not present

## 2020-06-12 DIAGNOSIS — H57813 Brow ptosis, bilateral: Secondary | ICD-10-CM | POA: Diagnosis not present

## 2020-06-16 ENCOUNTER — Other Ambulatory Visit: Payer: Self-pay | Admitting: Physician Assistant

## 2020-06-16 ENCOUNTER — Other Ambulatory Visit: Payer: Self-pay

## 2020-06-16 ENCOUNTER — Ambulatory Visit (INDEPENDENT_AMBULATORY_CARE_PROVIDER_SITE_OTHER): Payer: Medicare HMO

## 2020-06-16 ENCOUNTER — Ambulatory Visit: Payer: Medicare HMO | Admitting: Physician Assistant

## 2020-06-16 ENCOUNTER — Encounter: Payer: Self-pay | Admitting: Physician Assistant

## 2020-06-16 VITALS — BP 118/76 | HR 70 | Ht 67.0 in | Wt 204.0 lb

## 2020-06-16 DIAGNOSIS — I2510 Atherosclerotic heart disease of native coronary artery without angina pectoris: Secondary | ICD-10-CM

## 2020-06-16 DIAGNOSIS — I10 Essential (primary) hypertension: Secondary | ICD-10-CM

## 2020-06-16 DIAGNOSIS — I480 Paroxysmal atrial fibrillation: Secondary | ICD-10-CM | POA: Diagnosis not present

## 2020-06-16 DIAGNOSIS — I4891 Unspecified atrial fibrillation: Secondary | ICD-10-CM

## 2020-06-16 DIAGNOSIS — E785 Hyperlipidemia, unspecified: Secondary | ICD-10-CM | POA: Diagnosis not present

## 2020-06-16 NOTE — Patient Instructions (Signed)
Medication Instructions:  Your physician recommends that you continue on your current medications as directed. Please refer to the Current Medication list given to you today.  *If you need a refill on your cardiac medications before your next appointment, please call your pharmacy*   Lab Work: None today  If you have labs (blood work) drawn today and your tests are completely normal, you will receive your results only by: Marland Kitchen MyChart Message (if you have MyChart) OR . A paper copy in the mail If you have any lab test that is abnormal or we need to change your treatment, we will call you to review the results.   Testing/Procedures: Bryn Gulling- Long Term Monitor Instructions   Your physician has requested you wear your ZIO patch monitor__14_____days.   This is a single patch monitor.  [employer] supplies one patch monitor per enrollment.  Additional stickers are not available.    While looking in a mirror, press and release button in center of patch.  A small green light will flash 3-4 times .  This will be your only indicator the monitor has been turned on.     Do not shower for the first 24 hours.  You may shower after the first 24 hours.   Press button if you feel a symptom. You will hear a small click.  Record Date, Time and Symptom in the Patient Log Book.   When you are ready to remove patch, follow instructions on last 2 pages of Patient Log Book.  Stick patch monitor onto last page of Patient Log Book.   Place Patient Log Book in [place] box.  Use locking tab on box and tape box closed securely.  The [employer] has [employer] on it.  Please place in mailbox as soon as possible.  Your physician should have your test results approximately 7 days after the monitor has been mailed back to [facility].   Call [place] at [phone] if you have questions regarding your ZIO XT patch monitor.  Call them immediately if you see an orange light blinking on your  monitor.   If your monitor falls off in less than 4 days contact our Monitor department at [phone].  If your monitor becomes loose or falls off after 4 days call [employer] at [phone] for suggestions on securing your monitor.     Follow-Up: At [facility], you and your health needs are our priority.  As part of our continuing mission to provide you with exceptional heart care, we have created designated Provider Care Teams.  These Care Teams include your primary Cardiologist (physician) and Advanced Practice Providers (APPs -  Physician Assistants and Nurse Practitioners) who all work together to provide you with the care you need, when you need it.  We recommend signing up for the patient portal called "MyChart".  Sign up information is provided on this After Visit Summary.  MyChart is used to connect with patients for Virtual Visits (Telemedicine).  Patients are able to view lab/test results, encounter notes, upcoming appointments, etc.  Non-urgent messages can be sent to your provider as well.   To learn more about what you can do with MyChart, go to [email or web address].    Your next appointment:   After monitor is finished  The format for your next appointment:   In Person  Provider:   Rozann Lesches, MD   Other Instructions None   Thank you for choosing [facility] !

## 2020-07-03 DIAGNOSIS — I4891 Unspecified atrial fibrillation: Secondary | ICD-10-CM | POA: Diagnosis not present

## 2020-07-06 ENCOUNTER — Telehealth: Payer: Self-pay

## 2020-07-06 MED ORDER — METOPROLOL TARTRATE 25 MG PO TABS: ORAL_TABLET | ORAL | 3 refills | Status: DC

## 2020-07-06 NOTE — Telephone Encounter (Signed)
Medication changes per Gerrianne Scale, PA-C

## 2020-07-06 NOTE — Telephone Encounter (Signed)
-----   Message from Imogene Burn, PA-C sent at 07/06/2020  8:32 AM EDT ----- Monitor shows some Afib-10% and some fast heart rates up to 120/m. Would increase metoprolol 25 mg in am 12.5 in pm and he'll need to stay on the eliquis long term to prevent stroke

## 2020-07-06 NOTE — Telephone Encounter (Signed)
Contacted patient and spouse who both verbalized understanding. Patient and spouse had no questions. Medication list updated to reflect medication changes. Patient confirmed his 07/27/2020 appointment with Dr. Domenic Polite in [place].

## 2020-07-27 ENCOUNTER — Ambulatory Visit: Payer: Medicare HMO | Admitting: Cardiology

## 2020-07-27 ENCOUNTER — Encounter: Payer: Self-pay | Admitting: Cardiology

## 2020-07-27 ENCOUNTER — Other Ambulatory Visit: Payer: Self-pay

## 2020-07-27 VITALS — BP 136/66 | HR 59 | Ht 68.0 in | Wt 210.0 lb

## 2020-07-27 DIAGNOSIS — I480 Paroxysmal atrial fibrillation: Secondary | ICD-10-CM

## 2020-07-27 DIAGNOSIS — I25119 Atherosclerotic heart disease of native coronary artery with unspecified angina pectoris: Secondary | ICD-10-CM | POA: Diagnosis not present

## 2020-07-27 MED ORDER — ASPIRIN EC 81 MG PO TBEC: 81.0000 mg | DELAYED_RELEASE_TABLET | Freq: Every day | ORAL | 3 refills | Status: AC

## 2020-07-27 NOTE — Patient Instructions (Addendum)
Medication Instructions:   STOP Eliquis   START Aspirin 81 mg daily   *If you need a refill on your cardiac medications before your next appointment, please call your pharmacy*   Lab Work: None today  If you have labs (blood work) drawn today and your tests are completely normal, you will receive your results only by: Marland Kitchen MyChart Message (if you have MyChart) OR . A paper copy in the mail If you have any lab test that is abnormal or we need to change your treatment, we will call you to review the results.   Testing/Procedures: None today   Follow-Up: At [facility], you and your health needs are our priority.  As part of our continuing mission to provide you with exceptional heart care, we have created designated Provider Care Teams.  These Care Teams include your primary Cardiologist (physician) and Advanced Practice Providers (APPs -  Physician Assistants and Nurse Practitioners) who all work together to provide you with the care you need, when you need it.  We recommend signing up for the patient portal called "MyChart".  Sign up information is provided on this After Visit Summary.  MyChart is used to connect with patients for Virtual Visits (Telemedicine).  Patients are able to view lab/test results, encounter notes, upcoming appointments, etc.  Non-urgent messages can be sent to your provider as well.   To learn more about what you can do with MyChart, go to [email or web address].    Your next appointment:   3 month(s)  The format for your next appointment:   In Person  Provider: [facility] office , Dr.McDowell   Other Instructions None

## 2020-07-27 NOTE — Progress Notes (Signed)
Cardiology Office Note  Date: 07/27/2020   ID: Daniel Short, DOB [birth year], MRN [id number]  PCP:  Sharion Balloon, FNP  Cardiologist:  Rozann Lesches, MD Electrophysiologist:  None   Chief Complaint  Patient presents with  . Cardiac follow-up    History of Present Illness: Daniel Short is a 75 y.o. male last seen in March by Ms. Bonnell Public PA-C.  He was diagnosed with atrial fibrillation during the time of ERCP in March, subsequently started on metoprolol and Eliquis with plan for cardioversion after consultation by Dr. Harl Bowie, but ultimately spontaneously returned to sinus rhythm.  He is here today with his wife for a follow-up visit.  He is not aware of any sense of palpitations. Follow-up cardiac monitor in April is outlined below.  Beta-blocker dose has been uptitrated.  Atrial fibrillation burden was approximately 10%.  I reviewed his medications and previous history of stroke.  He had a spontaneous subarachnoid hemorrhage in 2000.  Based on this, I do not think that he is a good long-term candidate for anticoagulation.  I talked with him about evaluation for a Watchman device, however he does not want to undergo any invasive procedures.  He had been on aspirin as high as 325 mg for several years without obvious problems and we have agreed to switch from Eliquis to aspirin 81 mg daily for now.  Past Medical History:  Diagnosis Date  . Arthritis   . CAD (coronary artery disease)    Status post proximal/ostial RCA Zeta stents 2003  . Chronic back pain   . Diabetes mellitus without complication ([place])   . Ear infection 1979   Right ear due to wax build up  . Headache   . History of stroke 2000   Intracerebral hemorrhage  . Myocardial infarction ([place]) 2003  . Paroxysmal atrial fibrillation [facility])     Past Surgical History:  Procedure Laterality Date  . BILIARY STENT PLACEMENT N/A 02/06/2020   Procedure: BILIARY STENT PLACEMENT;  Surgeon: Rogene Houston, MD;  Location:  [facility];  Service: Endoscopy;  Laterality: N/A;  . CARPAL TUNNEL RELEASE Left   . CORONARY STENT PLACEMENT  2005  . ERCP N/A 02/06/2020   Procedure: ENDOSCOPIC RETROGRADE CHOLANGIOPANCREATOGRAPHY (ERCP) with stone extraction;  Surgeon: Rogene Houston, MD;  Location: [facility];  Service: Endoscopy;  Laterality: N/A;  1:15  . ERCP N/A 05/27/2020   Procedure: ENDOSCOPIC RETROGRADE CHOLANGIOPANCREATOGRAPHY (ERCP) WITH PROPOFOL;  Surgeon: Rogene Houston, MD;  Location: [facility];  Service: Endoscopy;  Laterality: N/A;  . GASTROINTESTINAL STENT REMOVAL N/A 05/27/2020   Procedure: STENT REMOVAL;  Surgeon: Rogene Houston, MD;  Location: [facility];  Service: Endoscopy;  Laterality: N/A;  . HERNIA REPAIR     ventral  . KNEE ARTHROSCOPY Right   . LUMBAR LAMINECTOMY/DECOMPRESSION MICRODISCECTOMY N/A 09/14/2016   Procedure: Decompression L3-5, insitu fusion L4-5;  Surgeon: Melina Schools, MD;  Location: [place];  Service: Orthopedics;  Laterality: N/A;  3 hrs  . LUMBAR LAMINECTOMY/DECOMPRESSION MICRODISCECTOMY N/A 09/17/2016   Procedure: WOUND EXPLORATION, GILL DECOMPRESSION,FAR LATERAL DISCECTOMY LEFT L4-L5, AND EVACUATION OF HEMATOMA;  Surgeon: Melina Schools, MD;  Location: [place];  Service: Orthopedics;  Laterality: N/A;  . SPHINCTEROTOMY N/A 02/06/2020   Procedure: SPHINCTEROTOMY;  Surgeon: Rogene Houston, MD;  Location: [facility];  Service: Endoscopy;  Laterality: N/A;    Current Outpatient Medications  Medication Sig Dispense Refill  . allopurinol (ZYLOPRIM) 100 MG tablet Take 1 tablet (100 mg total) by mouth  daily. (Needs to be seen before next refill) 90 tablet 3  . aspirin EC 81 MG tablet Take 1 tablet (81 mg total) by mouth daily. Swallow whole. 90 tablet 3  . atorvastatin (LIPITOR) 20 MG tablet Take 1 tablet (20 mg total) by mouth daily. 90 tablet 3  . blood glucose meter kit and supplies Dispense based on patient and insurance preference. Use up to four times daily as directed. (FOR ICD-10 E10.9, E11.9).  1 each 0  . Blood Glucose Monitoring Suppl ([place]) w/Device KIT Use to test blood sugar daily as directed. DX: E11.65 1 kit 1  . glucose blood (ONETOUCH VERIO) test strip Use to test blood sugar daily as directed. DX: E11.65 100 each 12  . HYDROcodone-acetaminophen (NORCO/VICODIN) 5-325 MG tablet Take 1 tablet by mouth every 6 (six) hours as needed for moderate pain.     Marland Kitchen lisinopril (ZESTRIL) 20 MG tablet Take 1 tablet (20 mg total) by mouth daily. (Needs to be seen before next refill) 90 tablet 3  . metFORMIN (GLUCOPHAGE-XR) 500 MG 24 hr tablet Take 1 tablet (500 mg total) by mouth daily with breakfast. (Needs to be seen before next refill) 90 tablet 3  . metoprolol tartrate (LOPRESSOR) 25 MG tablet Take 1 tablet by mouth in the morning and 0.5 tablet by mouth in the evening. 180 tablet 3  . OneTouch Delica Lancets 62H MISC Use to test blood sugar daily as directed. DX: E11.65 100 each 11  . Polyethyl Glycol-Propyl Glycol (SYSTANE OP) Place 1 drop into both eyes 2 (two) times daily as needed (dry eyes).     No current facility-administered medications for this visit.   Allergies:  Patient has no known allergies.   ROS: No dizziness or syncope.  Chronic back pain, uses a cane.  Physical Exam: VS:  BP 136/66   Pulse (!) 59   Ht 5' 8"  (1.727 m)   Wt 210 lb (95.3 kg)   SpO2 97%   BMI 31.93 kg/m , BMI Body mass index is 31.93 kg/m.  Wt Readings from Last 3 Encounters:  07/27/20 210 lb (95.3 kg)  06/16/20 204 lb (92.5 kg)  06/03/20 205 lb 6.4 oz (93.2 kg)    General: Patient appears comfortable at rest. HEENT: Conjunctiva and lids normal, wearing a mask. Neck: Supple, no elevated JVP or carotid bruits, no thyromegaly. Lungs: Clear to auscultation, nonlabored breathing at rest. Cardiac: Regular rate and rhythm, no S3, 1/6 systolic murmur, no pericardial rub. Extremities: No pitting edema.  ECG:  An ECG dated 06/16/2020 was personally reviewed today and  demonstrated:  Sinus rhythm with left anterior fascicular block.  Recent Labwork: 06/03/2020: ALT 23; AST 23; BUN 9; Creatinine, Ser 1.15; Hemoglobin 15.4; Platelets 216; Potassium 4.7; Sodium 140     Component Value Date/Time   CHOL 119 05/28/2020 0423   CHOL 101 12/19/2019 1026   TRIG 121 05/28/2020 0423   HDL 31 (L) 05/28/2020 0423   HDL 32 (L) 12/19/2019 1026   CHOLHDL 3.8 05/28/2020 0423   VLDL 24 05/28/2020 0423   LDLCALC 64 05/28/2020 0423   LDLCALC 49 12/19/2019 1026   LDLDIRECT 82 05/19/2016 1616    Other Studies Reviewed Today:  Echocardiogram 05/28/2020: 1. Left ventricular ejection fraction, by estimation, is 50%. The left  ventricle has low normal function. Left ventricular endocardial border not  optimally defined to evaluate regional wall motion. There is mild left  ventricular hypertrophy. Left  ventricular diastolic parameters are indeterminate.  2.  Right ventricular systolic function is normal. The right ventricular  size is normal.  3. The mitral valve is normal in structure. No evidence of mitral valve  regurgitation. No evidence of mitral stenosis.  4. The aortic valve was not well visualized. Aortic valve regurgitation  is not visualized. No aortic stenosis is present.  5. The inferior vena cava is normal in size with greater than 50%  respiratory variability, suggesting right atrial pressure of 3 mmHg.   Cardiac monitor April 2022: ZIO XT reviewed.  13 days 12 hours analyzed.  Predominant rhythm is sinus with heart rate ranging from 49 bpm up to 110 bpm and average heart rate 63 bpm.  There were occasional PACs representing 4.7% total beats, otherwise rare couplets and triplets representing less than 1% total beats.  There were rare PVCs including couplets and triplets representing less than 1% total beats with limited ventricular bigeminy.  7 episodes of NSVT were noted, the longest of which was 13 beats.  There were multiple episodes of PSVT, the longest  of which lasted for approximately 18 seconds at a heart rate of 120.  Limited atrial fibrillation also observed, 10% rhythm burden.  There were no pauses.  Assessment and Plan:  1.  Paroxysmal atrial fibrillation.  CHA2DS2-VASc score is 5.  He has been on Eliquis since March, approximately 10% atrial fibrillation burden by interval monitor.  I reviewed his history, he had a spontaneous subarachnoid hemorrhage in 2000 with hemorrhagic stroke.  Based on this, I do not think that he is a good long-term candidate for anticoagulation.  He was not interested in referral for a Watchman device.  He had been on aspirin previously and tolerating well.  After discussion today we have agreed to stop his Eliquis and go back to aspirin 81 mg daily.  2.  CAD status post BMS to the ostial and proximal RCA in 2003.  No active angina at this time.  Resuming aspirin as discussed above, continue Lipitor, Lopressor, and lisinopril.  Medication Adjustments/Labs and Tests Ordered: Current medicines are reviewed at length with the patient today.  Concerns regarding medicines are outlined above.   Tests Ordered: No orders of the defined types were placed in this encounter.   Medication Changes: Meds ordered this encounter  Medications  . aspirin EC 81 MG tablet    Sig: Take 1 tablet (81 mg total) by mouth daily. Swallow whole.    Dispense:  90 tablet    Refill:  3    07/27/20 Eliquis stopped    Disposition:  Follow up 3 months.  Signed, Satira Sark, MD, [facility] 07/27/2020 11:54 AM    [facility] [place], [place], [place] [address] Phone: [phone]; Fax: [phone]

## 2020-08-04 ENCOUNTER — Other Ambulatory Visit: Payer: Self-pay | Admitting: Family

## 2020-08-04 DIAGNOSIS — E118 Type 2 diabetes mellitus with unspecified complications: Secondary | ICD-10-CM

## 2020-08-05 ENCOUNTER — Encounter: Payer: Self-pay | Admitting: Family Medicine

## 2020-09-04 ENCOUNTER — Encounter: Payer: Self-pay | Admitting: Family

## 2020-09-04 ENCOUNTER — Ambulatory Visit (INDEPENDENT_AMBULATORY_CARE_PROVIDER_SITE_OTHER): Payer: Medicare HMO | Admitting: Family

## 2020-09-04 ENCOUNTER — Other Ambulatory Visit: Payer: Self-pay

## 2020-09-04 VITALS — BP 141/73 | HR 74 | Temp 97.5°F | Ht 69.0 in | Wt 209.4 lb

## 2020-09-04 DIAGNOSIS — E1169 Type 2 diabetes mellitus with other specified complication: Secondary | ICD-10-CM | POA: Diagnosis not present

## 2020-09-04 DIAGNOSIS — E785 Hyperlipidemia, unspecified: Secondary | ICD-10-CM

## 2020-09-04 DIAGNOSIS — M549 Dorsalgia, unspecified: Secondary | ICD-10-CM | POA: Diagnosis not present

## 2020-09-04 DIAGNOSIS — E119 Type 2 diabetes mellitus without complications: Secondary | ICD-10-CM

## 2020-09-04 DIAGNOSIS — I2510 Atherosclerotic heart disease of native coronary artery without angina pectoris: Secondary | ICD-10-CM

## 2020-09-04 DIAGNOSIS — M1712 Unilateral primary osteoarthritis, left knee: Secondary | ICD-10-CM | POA: Diagnosis not present

## 2020-09-04 DIAGNOSIS — E1159 Type 2 diabetes mellitus with other circulatory complications: Secondary | ICD-10-CM

## 2020-09-04 DIAGNOSIS — I152 Hypertension secondary to endocrine disorders: Secondary | ICD-10-CM

## 2020-09-04 DIAGNOSIS — G8929 Other chronic pain: Secondary | ICD-10-CM | POA: Diagnosis not present

## 2020-09-04 LAB — BAYER DCA HB A1C WAIVED: HB A1C (BAYER DCA - WAIVED): 5.9 % (ref <7.0)

## 2020-09-04 NOTE — Progress Notes (Signed)
Subjective:    Patient ID: Daniel Short, male    DOB: [birth year], 75 y.o.   MRN: [id number]  Chief Complaint  Patient presents with   Medical Management of Chronic Issues    Needs wheelchair in a lot of pain   PT presents to the office today for chronic follow up. He is followed by Pain clinic for chronic back pain. Pt is followed by Cardiologists every 3 months for CAD and hx of MI.   He is followed by GI for hyperbilirubinemia.  Hypertension This is a chronic problem. The current episode started more than 1 year ago. The problem has been waxing and waning since onset. The problem is controlled. Associated symptoms include malaise/fatigue. Pertinent negatives include no blurred vision, peripheral edema or shortness of breath. Risk factors for coronary artery disease include dyslipidemia, diabetes mellitus, obesity and male gender. Hypertensive end-organ damage includes CAD/MI.  Hyperlipidemia This is a chronic problem. The current episode started more than 1 year ago. The problem is controlled. Recent lipid tests were reviewed and are normal. Exacerbating diseases include obesity. Pertinent negatives include no shortness of breath. Current antihyperlipidemic treatment includes statins. The current treatment provides moderate improvement of lipids. Risk factors for coronary artery disease include dyslipidemia, diabetes mellitus, male sex, hypertension and a sedentary lifestyle.  Diabetes He presents for his follow-up diabetic visit. He has type 2 diabetes mellitus. His disease course has been stable. There are no hypoglycemic associated symptoms. Pertinent negatives for diabetes include no blurred vision. Symptoms are stable. Diabetic complications include heart disease. Risk factors for coronary artery disease include dyslipidemia, diabetes mellitus, hypertension, male sex and sedentary lifestyle. He is following a generally unhealthy diet. (Does not check at home) Eye exam is current.  Back  Pain This is a chronic problem. The current episode started more than 1 year ago. The problem occurs intermittently. The problem has been waxing and waning since onset. The pain is present in the lumbar spine. The quality of the pain is described as aching. The pain is moderate. The symptoms are aggravated by twisting and standing. He has tried bed rest and analgesics for the symptoms. The treatment provided moderate relief.     Review of Systems  Constitutional:  Positive for malaise/fatigue.  Eyes:  Negative for blurred vision.  Respiratory:  Negative for shortness of breath.   Musculoskeletal:  Positive for back pain.  All other systems reviewed and are negative.     Objective:   Physical Exam Vitals reviewed.  Constitutional:      General: He is not in acute distress.    Appearance: He is well-developed. He is obese.  HENT:     Head: Normocephalic.     Right Ear: Tympanic membrane normal.     Left Ear: Tympanic membrane normal.  Eyes:     General:        Right eye: No discharge.        Left eye: No discharge.     Pupils: Pupils are equal, round, and reactive to light.  Neck:     Thyroid: No thyromegaly.  Cardiovascular:     Rate and Rhythm: Normal rate and regular rhythm.     Heart sounds: Normal heart sounds. No murmur heard. Pulmonary:     Effort: Pulmonary effort is normal. No respiratory distress.     Breath sounds: Wheezing present.  Abdominal:     General: Bowel sounds are normal. There is no distension.     Palpations: Abdomen is soft.  Tenderness: There is abdominal tenderness (RUQ).  Musculoskeletal:        General: Tenderness (left knee pain with flexion and extension) present. Normal range of motion.     Cervical back: Normal range of motion and neck supple.  Skin:    General: Skin is warm and dry.     Findings: No erythema or rash.  Neurological:     Mental Status: He is alert and oriented to person, place, and time.     Cranial Nerves: No cranial  nerve deficit.     Motor: Weakness (using cane) present.     Gait: Gait abnormal.     Deep Tendon Reflexes: Reflexes are normal and symmetric.  Psychiatric:        Behavior: Behavior normal.        Thought Content: Thought content normal.      BP (!) 141/73   Pulse 74   Temp (!) 97.5 F (36.4 C) (Temporal)   Ht 5' 9"  (1.753 m)   Wt 209 lb 6.4 oz (95 kg)   BMI 30.92 kg/m      Assessment & Plan:  KATIE MOCH comes in today with chief complaint of Medical Management of Chronic Issues (Needs wheelchair in a lot of pain)   Diagnosis and orders addressed:  1. Hypertension associated with diabetes ([place]) - CMP14+EGFR - CBC with Differential/Platelet  2. Coronary artery disease involving native coronary artery of native heart without angina pectoris - CMP14+EGFR - CBC with Differential/Platelet  3. Hyperlipidemia associated with type 2 diabetes mellitus ([facility]) - CMP14+EGFR - CBC with Differential/Platelet  4. Type 2 diabetes mellitus without complication, without long-term current use of insulin ([facility]) - CMP14+EGFR - CBC with Differential/Platelet - Bayer DCA Hb A1c Waived - Microalbumin / creatinine urine ratio  5. Morbid obesity ([place]) - CMP14+EGFR - CBC with Differential/Platelet  6. Chronic back pain, unspecified back location, unspecified back pain laterality - CMP14+EGFR - CBC with Differential/Platelet - DME Wheelchair manual  7. Primary osteoarthritis of left knee - CMP14+EGFR - CBC with Differential/Platelet - DME Wheelchair manual   Labs pending Health Maintenance reviewed Diet and exercise encouraged  Follow up plan: 3 months    Evelina Dun, FNP

## 2020-09-04 NOTE — Patient Instructions (Signed)
Osteoarthritis Osteoarthritis is a type of arthritis. It refers to joint pain or joint disease. Osteoarthritis affects tissue that covers the ends of bones in joints (cartilage). Cartilage acts as a cushion between the bones and helps them move smoothly. Osteoarthritis occurs when cartilage in the joints gets worn down. Osteoarthritis is sometimes called "wear and tear" arthritis. Osteoarthritis is the most common form of arthritis. It often occurs in older people. It is a condition that gets worse over time. The joints most often affected by this condition are in the fingers, toes, hips, knees, and spine, including the neck and lower back. What are the causes? This condition is caused by the wearing down of cartilage that covers the ends of bones. What increases the risk? The following factors may make you more likely to develop this condition: Being age 50 or older. Obesity. Overuse of joints. Past injury of a joint. Past surgery on a joint. Family history of osteoarthritis. What are the signs or symptoms? The main symptoms of this condition are pain, swelling, and stiffness in the joint. Other symptoms may include: An enlarged joint. More pain and further damage caused by small pieces of bone or cartilage that break off and float inside of the joint. Small deposits of bone (osteophytes) that grow on the edges of the joint. A grating or scraping feeling inside the joint when you move it. Popping or creaking sounds when you move. Difficulty walking or exercising. An inability to grip items, twist your hand(s), or control the movements of your hands and fingers. How is this diagnosed? This condition may be diagnosed based on: Your medical history. A physical exam. Your symptoms. X-rays of the affected joint(s). Blood tests to rule out other types of arthritis. How is this treated? There is no cure for this condition, but treatment can help control pain and improve joint function.  Treatment may include a combination of therapies, such as: Pain relief techniques, such as: Applying heat and cold to the joint. Massage. A form of talk therapy called cognitive behavioral therapy (CBT). This therapy helps you set goals and follow up on the changes that you make. Medicines for pain and inflammation. The medicines can be taken by mouth or applied to the skin. They include: NSAIDs, such as ibuprofen. Prescription medicines. Strong anti-inflammatory medicines (corticosteroids). Certain nutritional supplements. A prescribed exercise program. You may work with a physical therapist. Assistive devices, such as a brace, wrap, splint, specialized glove, or cane. A weight control plan. Surgery, such as: An osteotomy. This is done to reposition the bones and relieve pain or to remove loose pieces of bone and cartilage. Joint replacement surgery. You may need this surgery if you have advanced osteoarthritis. Follow these instructions at home: Activity Rest your affected joints as told by your health care provider. Exercise as told by your health care provider. He or she may recommend specific types of exercise, such as: Strengthening exercises. These are done to strengthen the muscles that support joints affected by arthritis. Aerobic activities. These are exercises, such as brisk walking or water aerobics, that increase your heart rate. Range-of-motion activities. These help your joints move more easily. Balance and agility exercises. Managing pain, stiffness, and swelling   If directed, apply heat to the affected area as often as told by your health care provider. Use the heat source that your health care provider recommends, such as a moist heat pack or a heating pad. If you have a removable assistive device, remove it as told by   your health care provider. Place a towel between your skin and the heat source. If your health care provider tells you to keep the assistive device on  while you apply heat, place a towel between the assistive device and the heat source. Leave the heat on for 20-30 minutes. Remove the heat if your skin turns bright red. This is especially important if you are unable to feel pain, heat, or cold. You may have a greater risk of getting burned. If directed, put ice on the affected area. To do this: If you have a removable assistive device, remove it as told by your health care provider. Put ice in a plastic bag. Place a towel between your skin and the bag. If your health care provider tells you to keep the assistive device on during icing, place a towel between the assistive device and the bag. Leave the ice on for 20 minutes, 2-3 times a day. Move your fingers or toes often to reduce stiffness and swelling. Raise (elevate) the injured area above the level of your heart while you are sitting or lying down. General instructions Take over-the-counter and prescription medicines only as told by your health care provider. Maintain a healthy weight. Follow instructions from your health care provider for weight control. Do not use any products that contain nicotine or tobacco, such as cigarettes, e-cigarettes, and chewing tobacco. If you need help quitting, ask your health care provider. Use assistive devices as told by your health care provider. Keep all follow-up visits as told by your health care provider. This is important. Where to find more information National Institute of Arthritis and Musculoskeletal and Skin Diseases: www.niams.nih.gov National Institute on Aging: www.nia.nih.gov American College of Rheumatology: www.rheumatology.org Contact a health care provider if: You have redness, swelling, or a feeling of warmth in a joint that gets worse. You have a fever along with joint or muscle aches. You develop a rash. You have trouble doing your normal activities. Get help right away if: You have pain that gets worse and is not relieved by  pain medicine. Summary Osteoarthritis is a type of arthritis that affects tissue covering the ends of bones in joints (cartilage). This condition is caused by the wearing down of cartilage that covers the ends of bones. The main symptom of this condition is pain, swelling, and stiffness in the joint. There is no cure for this condition, but treatment can help control pain and improve joint function. This information is not intended to replace advice given to you by your health care provider. Make sure you discuss any questions you have with your health care provider. Document Revised: 03/11/2019 Document Reviewed: 03/11/2019 Elsevier Patient Education  2022 [employer] Inc.  

## 2020-09-05 DIAGNOSIS — M1712 Unilateral primary osteoarthritis, left knee: Secondary | ICD-10-CM | POA: Diagnosis not present

## 2020-09-05 DIAGNOSIS — M5459 Other low back pain: Secondary | ICD-10-CM | POA: Diagnosis not present

## 2020-09-05 LAB — CMP14+EGFR
ALT: 25 IU/L (ref 0–44)
AST: 25 IU/L (ref 0–40)
Albumin/Globulin Ratio: 1.7 (ref 1.2–2.2)
Albumin: 4.5 g/dL (ref 3.7–4.7)
Alkaline Phosphatase: 64 IU/L (ref 44–121)
BUN/Creatinine Ratio: 9 — ABNORMAL LOW (ref 10–24)
BUN: 10 mg/dL (ref 8–27)
Bilirubin Total: 2.2 mg/dL — ABNORMAL HIGH (ref 0.0–1.2)
CO2: 24 mmol/L (ref 20–29)
Calcium: 10 mg/dL (ref 8.6–10.2)
Chloride: 102 mmol/L (ref 96–106)
Creatinine, Ser: 1.08 mg/dL (ref 0.76–1.27)
Globulin, Total: 2.6 g/dL (ref 1.5–4.5)
Glucose: 104 mg/dL — ABNORMAL HIGH (ref 65–99)
Potassium: 4.6 mmol/L (ref 3.5–5.2)
Sodium: 140 mmol/L (ref 134–144)
Total Protein: 7.1 g/dL (ref 6.0–8.5)
eGFR: 72 mL/min/{1.73_m2} (ref >59)

## 2020-09-05 LAB — CBC WITH DIFFERENTIAL/PLATELET
Basophils Absolute: 0.1 10*3/uL (ref 0.0–0.2)
Basos: 1 %
EOS (ABSOLUTE): 0.1 10*3/uL (ref 0.0–0.4)
Eos: 2 %
Hematocrit: 44.5 % (ref 37.5–51.0)
Hemoglobin: 15 g/dL (ref 13.0–17.7)
Immature Grans (Abs): 0 10*3/uL (ref 0.0–0.1)
Immature Granulocytes: 0 %
Lymphocytes Absolute: 2.7 10*3/uL (ref 0.7–3.1)
Lymphs: 35 %
MCH: 30.1 pg (ref 26.6–33.0)
MCHC: 33.7 g/dL (ref 31.5–35.7)
MCV: 89 fL (ref 79–97)
Monocytes Absolute: 0.8 10*3/uL (ref 0.1–0.9)
Monocytes: 11 %
Neutrophils Absolute: 4 10*3/uL (ref 1.4–7.0)
Neutrophils: 51 %
Platelets: 195 10*3/uL (ref 150–450)
RBC: 4.99 x10E6/uL (ref 4.14–5.80)
RDW: 12.5 % (ref 11.6–15.4)
WBC: 7.8 10*3/uL (ref 3.4–10.8)

## 2020-09-05 LAB — MICROALBUMIN / CREATININE URINE RATIO
Creatinine, Urine: 374 mg/dL
Microalb/Creat Ratio: 16 mg/g creat (ref 0–29)
Microalbumin, Urine: 59.2 ug/mL

## 2020-09-08 ENCOUNTER — Telehealth: Payer: Self-pay | Admitting: Family

## 2020-09-08 NOTE — Telephone Encounter (Signed)
Pts wife called to get lab results. Reviewed results with pts wife per Christys notes. Wife voiced understanding but said she wanted to speak with the nurse because she has questions about his order for wheelchair and concerns about a urine sample he left for [place] to check at his appointment.

## 2020-10-08 DIAGNOSIS — E119 Type 2 diabetes mellitus without complications: Secondary | ICD-10-CM | POA: Diagnosis not present

## 2020-10-08 DIAGNOSIS — E559 Vitamin D deficiency, unspecified: Secondary | ICD-10-CM | POA: Diagnosis not present

## 2020-10-08 DIAGNOSIS — Z6832 Body mass index (BMI) 32.0-32.9, adult: Secondary | ICD-10-CM | POA: Diagnosis not present

## 2020-10-08 DIAGNOSIS — G8929 Other chronic pain: Secondary | ICD-10-CM | POA: Diagnosis not present

## 2020-10-08 DIAGNOSIS — M545 Low back pain, unspecified: Secondary | ICD-10-CM | POA: Diagnosis not present

## 2020-10-08 DIAGNOSIS — Z79899 Other long term (current) drug therapy: Secondary | ICD-10-CM | POA: Diagnosis not present

## 2020-11-17 ENCOUNTER — Ambulatory Visit: Payer: Medicare HMO | Admitting: Cardiology

## 2020-11-17 ENCOUNTER — Encounter: Payer: Self-pay | Admitting: Cardiology

## 2020-11-17 VITALS — BP 130/70 | HR 50 | Ht 65.0 in | Wt 218.2 lb

## 2020-11-17 DIAGNOSIS — I25119 Atherosclerotic heart disease of native coronary artery with unspecified angina pectoris: Secondary | ICD-10-CM

## 2020-11-17 DIAGNOSIS — I480 Paroxysmal atrial fibrillation: Secondary | ICD-10-CM | POA: Diagnosis not present

## 2020-11-17 NOTE — Progress Notes (Signed)
Cardiology Office Note  Date: 11/17/2020   ID: Ky, Rumple January 05, 1946, MRN [id number]  PCP:  Sharion Balloon, FNP  Cardiologist:  Rozann Lesches, MD Electrophysiologist:  None   Chief Complaint  Patient presents with   Cardiac follow-up    History of Present Illness: Daniel Short is a 75 y.o. male last seen in May.  He is here today with his wife for a follow-up visit.  He does not report any active angina or palpitations on current therapy.  CHA2DS2-VASc score is 5.  We again discussed stroke risk and concern about continuing anticoagulation given prior history of spontaneous subarachnoid hemorrhage with hemorrhagic stroke.  He is on aspirin at this time and has declined evaluation for Watchman implantation.  I reviewed his lab work as noted below.  Past Medical History:  Diagnosis Date   Arthritis    CAD (coronary artery disease)    Status post proximal/ostial RCA Zeta stents 2003   Chronic back pain    Diabetes mellitus without complication ([place])    Ear infection 1979   Right ear due to wax build up   Headache    History of stroke 2000   Intracerebral hemorrhage   Myocardial infarction ([place]) 2003   Paroxysmal atrial fibrillation ([place])     Past Surgical History:  Procedure Laterality Date   BILIARY STENT PLACEMENT N/A 02/06/2020   Procedure: BILIARY STENT PLACEMENT;  Surgeon: Rogene Houston, MD;  Location: [facility];  Service: Endoscopy;  Laterality: N/A;   CARPAL TUNNEL RELEASE Left    CORONARY STENT PLACEMENT  2005   ERCP N/A 02/06/2020   Procedure: ENDOSCOPIC RETROGRADE CHOLANGIOPANCREATOGRAPHY (ERCP) with stone extraction;  Surgeon: Rogene Houston, MD;  Location: [facility];  Service: Endoscopy;  Laterality: N/A;  1:15   ERCP N/A 05/27/2020   Procedure: ENDOSCOPIC RETROGRADE CHOLANGIOPANCREATOGRAPHY (ERCP) WITH PROPOFOL;  Surgeon: Rogene Houston, MD;  Location: [facility];  Service: Endoscopy;  Laterality: N/A;   GASTROINTESTINAL STENT REMOVAL N/A  05/27/2020   Procedure: STENT REMOVAL;  Surgeon: Rogene Houston, MD;  Location: [facility];  Service: Endoscopy;  Laterality: N/A;   HERNIA REPAIR     ventral   KNEE ARTHROSCOPY Right    LUMBAR LAMINECTOMY/DECOMPRESSION MICRODISCECTOMY N/A 09/14/2016   Procedure: Decompression L3-5, insitu fusion L4-5;  Surgeon: Melina Schools, MD;  Location: [facility];  Service: Orthopedics;  Laterality: N/A;  3 hrs   LUMBAR LAMINECTOMY/DECOMPRESSION MICRODISCECTOMY N/A 09/17/2016   Procedure: WOUND EXPLORATION, GILL DECOMPRESSION,FAR LATERAL DISCECTOMY LEFT L4-L5, AND EVACUATION OF HEMATOMA;  Surgeon: Melina Schools, MD;  Location: [facility];  Service: Orthopedics;  Laterality: N/A;   SPHINCTEROTOMY N/A 02/06/2020   Procedure: SPHINCTEROTOMY;  Surgeon: Rogene Houston, MD;  Location: [facility];  Service: Endoscopy;  Laterality: N/A;    Current Outpatient Medications  Medication Sig Dispense Refill   allopurinol (ZYLOPRIM) 100 MG tablet Take 1 tablet (100 mg total) by mouth daily. (Needs to be seen before next refill) 90 tablet 3   aspirin EC 81 MG tablet Take 1 tablet (81 mg total) by mouth daily. Swallow whole. 90 tablet 3   atorvastatin (LIPITOR) 20 MG tablet Take 1 tablet (20 mg total) by mouth daily. 90 tablet 3   blood glucose meter kit and supplies Dispense based on patient and insurance preference. Use up to four times daily as directed. (FOR ICD-10 E10.9, E11.9). 1 each 0   Blood Glucose Monitoring Suppl ([place]) w/Device KIT Use to test blood  sugar daily as directed. DX: E11.65 1 kit 1   glucose blood (ONETOUCH VERIO) test strip Use to test blood sugar daily as directed. DX: E11.65 100 each 12   HYDROcodone-acetaminophen (NORCO/VICODIN) 5-325 MG tablet Take 1 tablet by mouth every 6 (six) hours as needed for moderate pain.      lisinopril (ZESTRIL) 20 MG tablet Take 1 tablet (20 mg total) by mouth daily. (Needs to be seen before next refill) 90 tablet 3   metFORMIN (GLUCOPHAGE-XR) 500 MG 24 hr  tablet TAKE 1 TABLET DAILY WITH BREAKFAST 90 tablet 0   metoprolol tartrate (LOPRESSOR) 25 MG tablet Take 1 tablet by mouth in the morning and 0.5 tablet by mouth in the evening. 180 tablet 3   OneTouch Delica Lancets 60F MISC Use to test blood sugar daily as directed. DX: E11.65 100 each 11   Polyethyl Glycol-Propyl Glycol (SYSTANE OP) Place 1 drop into both eyes 2 (two) times daily as needed (dry eyes).     No current facility-administered medications for this visit.   Allergies:  Patient has no known allergies.   ROS: Chronic lower back pain.  Physical Exam: VS:  BP 130/70   Pulse (!) 50   Ht 5' 5"  (1.651 m)   Wt 218 lb 3.2 oz (99 kg)   SpO2 97%   BMI 36.31 kg/m , BMI Body mass index is 36.31 kg/m.  Wt Readings from Last 3 Encounters:  11/17/20 218 lb 3.2 oz (99 kg)  09/04/20 209 lb 6.4 oz (95 kg)  07/27/20 210 lb (95.3 kg)    General: Patient appears comfortable at rest.  Using a cane. HEENT: Conjunctiva and lids normal, wearing a mask. Neck: Supple, no elevated JVP or carotid bruits, no thyromegaly. Lungs: Clear to auscultation, nonlabored breathing at rest. Cardiac: Regular rate and rhythm, no S3, 1/6 systolic murmur, no pericardial rub. Extremities: No pitting edema.  ECG:  An ECG dated 06/16/2020 was personally reviewed today and demonstrated:  Sinus rhythm with left anterior fascicular block.  Recent Labwork: 09/04/2020: ALT 25; AST 25; BUN 10; Creatinine, Ser 1.08; Hemoglobin 15.0; Platelets 195; Potassium 4.6; Sodium 140     Component Value Date/Time   CHOL 119 05/28/2020 0423   CHOL 101 12/19/2019 1026   TRIG 121 05/28/2020 0423   HDL 31 (L) 05/28/2020 0423   HDL 32 (L) 12/19/2019 1026   CHOLHDL 3.8 05/28/2020 0423   VLDL 24 05/28/2020 0423   LDLCALC 64 05/28/2020 0423   LDLCALC 49 12/19/2019 1026   LDLDIRECT 82 05/19/2016 1616    Other Studies Reviewed Today:  Echocardiogram 05/28/2020:  1. Left ventricular ejection fraction, by estimation, is 50%. The  left  ventricle has low normal function. Left ventricular endocardial border not  optimally defined to evaluate regional wall motion. There is mild left  ventricular hypertrophy. Left  ventricular diastolic parameters are indeterminate.   2. Right ventricular systolic function is normal. The right ventricular  size is normal.   3. The mitral valve is normal in structure. No evidence of mitral valve  regurgitation. No evidence of mitral stenosis.   4. The aortic valve was not well visualized. Aortic valve regurgitation  is not visualized. No aortic stenosis is present.   5. The inferior vena cava is normal in size with greater than 50%  respiratory variability, suggesting right atrial pressure of 3 mmHg.    Cardiac monitor April 2022: ZIO XT reviewed.  13 days 12 hours analyzed.  Predominant rhythm is sinus with heart rate  ranging from 49 bpm up to 110 bpm and average heart rate 63 bpm.  There were occasional PACs representing 4.7% total beats, otherwise rare couplets and triplets representing less than 1% total beats.  There were rare PVCs including couplets and triplets representing less than 1% total beats with limited ventricular bigeminy.  7 episodes of NSVT were noted, the longest of which was 13 beats.  There were multiple episodes of PSVT, the longest of which lasted for approximately 18 seconds at a heart rate of 120.  Limited atrial fibrillation also observed, 10% rhythm burden.  There were no pauses.  Assessment and Plan:  1.  Paroxysmal atrial fibrillation with CHA2DS2-VASc score of 5.  As discussed above, he is on aspirin rather than DOAC in light of prior history of spontaneous subarachnoid hemorrhage and hemorrhagic stroke.  Still remains at risk for thromboembolic event.  He has declined referral for Watchman device evaluation.  No active palpitations.  Continue Lopressor.  2.  CAD status post BMS to the ostial and proximal RCA in 2003.  Continue to follow expectantly on medical  therapy in the absence of angina symptoms.  He is on aspirin, Lopressor, lisinopril, and Lipitor.  Medication Adjustments/Labs and Tests Ordered: Current medicines are reviewed at length with the patient today.  Concerns regarding medicines are outlined above.   Tests Ordered: No orders of the defined types were placed in this encounter.   Medication Changes: No orders of the defined types were placed in this encounter.   Disposition:  Follow up  6 months.  Signed, Satira Sark, MD, [facility] 11/17/2020 11:02 AM    [facility] [place] [facility] [place], [place], [place] [address] Phone: [phone]; Fax: [phone]

## 2020-11-17 NOTE — Patient Instructions (Addendum)

## 2021-01-06 ENCOUNTER — Encounter: Payer: Self-pay | Admitting: Physician Assistant

## 2021-01-06 ENCOUNTER — Ambulatory Visit: Payer: Medicare HMO | Admitting: Physician Assistant

## 2021-01-06 ENCOUNTER — Other Ambulatory Visit: Payer: Self-pay

## 2021-01-06 DIAGNOSIS — D485 Neoplasm of uncertain behavior of skin: Secondary | ICD-10-CM

## 2021-01-06 DIAGNOSIS — Z85828 Personal history of other malignant neoplasm of skin: Secondary | ICD-10-CM | POA: Diagnosis not present

## 2021-01-06 DIAGNOSIS — L820 Inflamed seborrheic keratosis: Secondary | ICD-10-CM

## 2021-01-06 DIAGNOSIS — Z1283 Encounter for screening for malignant neoplasm of skin: Secondary | ICD-10-CM | POA: Diagnosis not present

## 2021-01-06 NOTE — Patient Instructions (Signed)

## 2021-01-08 ENCOUNTER — Encounter: Payer: Self-pay | Admitting: Physician Assistant

## 2021-01-08 NOTE — Progress Notes (Signed)
   New Patient   Subjective  Daniel Short is a 74 y.o. male who presents for the following: Annual Exam (Skin check, history of bcc left inner ear 1997. Right side of face raised scaly lesion ).   The following portions of the chart were reviewed this encounter and updated as appropriate:  Tobacco  Allergies  Meds  Problems  Med Hx  Surg Hx  Fam Hx      Objective  Well appearing patient in no apparent distress; mood and affect are within normal limits.  A focused examination was performed including face. Relevant physical exam findings are noted in the Assessment and Plan.  Right Temple Hyperkeratotic scale with brown base         Assessment & Plan  Neoplasm of uncertain behavior of skin Right Temple  Skin / nail biopsy Type of biopsy: tangential   Informed consent: discussed and consent obtained   Timeout: patient name, date of birth, surgical site, and procedure verified   Procedure prep:  Patient was prepped and draped in usual sterile fashion (Non sterile) Prep type:  Chlorhexidine Anesthesia: the lesion was anesthetized in a standard fashion   Anesthetic:  1% lidocaine w/ epinephrine 1-100,000 local infiltration Instrument used: flexible razor blade   Outcome: patient tolerated procedure well   Post-procedure details: wound care instructions given    Destruction of lesion Complexity: simple   Destruction method: electrodesiccation and curettage   Informed consent: discussed and consent obtained   Timeout:  patient name, date of birth, surgical site, and procedure verified Anesthesia: the lesion was anesthetized in a standard fashion   Anesthetic:  1% lidocaine w/ epinephrine 1-100,000 local infiltration Curettage performed in three different directions: Yes   Curettage cycles:  1 Margin per side (cm):  0.1 Final wound size (cm):  1 Hemostasis achieved with:  aluminum chloride and ferric subsulfate Outcome: patient tolerated procedure well with no  complications   Post-procedure details: wound care instructions given   Additional details:  Wound innoculated with 5 fluorouracil solution.  Specimen 1 - Surgical pathology Differential Diagnosis: bcc vs scc- txpbx  Check Margins: No    I, Schneur Crowson, PA-C, have reviewed all documentation's for this visit.  The documentation on 01/08/21 for the exam, diagnosis, procedures and orders are all accurate and complete.

## 2021-01-20 ENCOUNTER — Telehealth: Payer: Self-pay | Admitting: Physician Assistant

## 2021-01-20 NOTE — Telephone Encounter (Signed)
Patient's wife calling (DPR on file) for Bx results from 10/12. I seen results for inflamed SK and had nurse review so I could go ahead and inform of results. Wife voiced understanding. Sending as FYI only.

## 2021-02-11 ENCOUNTER — Ambulatory Visit (INDEPENDENT_AMBULATORY_CARE_PROVIDER_SITE_OTHER): Payer: Medicare HMO | Admitting: Family

## 2021-02-11 ENCOUNTER — Other Ambulatory Visit: Payer: Self-pay

## 2021-02-11 ENCOUNTER — Encounter: Payer: Self-pay | Admitting: Family

## 2021-02-11 VITALS — BP 134/71 | HR 74 | Temp 97.5°F | Ht 65.0 in | Wt 219.8 lb

## 2021-02-11 DIAGNOSIS — M545 Low back pain, unspecified: Secondary | ICD-10-CM

## 2021-02-11 DIAGNOSIS — E785 Hyperlipidemia, unspecified: Secondary | ICD-10-CM

## 2021-02-11 DIAGNOSIS — E1159 Type 2 diabetes mellitus with other circulatory complications: Secondary | ICD-10-CM

## 2021-02-11 DIAGNOSIS — I2510 Atherosclerotic heart disease of native coronary artery without angina pectoris: Secondary | ICD-10-CM | POA: Diagnosis not present

## 2021-02-11 DIAGNOSIS — I152 Hypertension secondary to endocrine disorders: Secondary | ICD-10-CM

## 2021-02-11 DIAGNOSIS — G8929 Other chronic pain: Secondary | ICD-10-CM | POA: Diagnosis not present

## 2021-02-11 DIAGNOSIS — E1169 Type 2 diabetes mellitus with other specified complication: Secondary | ICD-10-CM | POA: Diagnosis not present

## 2021-02-11 LAB — BAYER DCA HB A1C WAIVED: HB A1C (BAYER DCA - WAIVED): 5.9 % — ABNORMAL HIGH (ref 4.8–5.6)

## 2021-02-11 NOTE — Progress Notes (Signed)
Subjective:    Patient ID: Daniel Short, male    DOB: March 12, 1946, 75 y.o.   MRN: [id number]  Chief Complaint  Patient presents with   Medical Management of Chronic Issues   PT presents to the office today for chronic follow up. He is followed by Pain clinic for chronic back pain. Pt is followed by Cardiologists every 3 months for CAD and hx of MI.    He is followed by GI for hyperbilirubinemia.  Hypertension This is a chronic problem. The current episode started more than 1 year ago. The problem has been resolved since onset. The problem is controlled. Associated symptoms include malaise/fatigue and shortness of breath (when moving). Pertinent negatives include no blurred vision or peripheral edema. Risk factors for coronary artery disease include dyslipidemia, obesity, male gender and sedentary lifestyle. The current treatment provides moderate improvement.  Hyperlipidemia This is a chronic problem. The current episode started more than 1 year ago. The problem is controlled. Exacerbating diseases include obesity. Associated symptoms include shortness of breath (when moving). Current antihyperlipidemic treatment includes statins. The current treatment provides moderate improvement of lipids. Risk factors for coronary artery disease include diabetes mellitus, dyslipidemia, male sex, hypertension and a sedentary lifestyle.  Diabetes He presents for his follow-up diabetic visit. He has type 2 diabetes mellitus. There are no hypoglycemic associated symptoms. Associated symptoms include foot paresthesias. Pertinent negatives for diabetes include no blurred vision. Symptoms are stable. Diabetic complications include heart disease and peripheral neuropathy. Risk factors for coronary artery disease include dyslipidemia, diabetes mellitus, hypertension, male sex and sedentary lifestyle. (Does not check at home ) Eye exam is current.  Back Pain This is a chronic problem. The current episode started  more than 1 year ago. The problem occurs intermittently. The problem has been waxing and waning since onset. The pain is present in the lumbar spine. The quality of the pain is described as aching. The pain is at a severity of 4/10. The pain is moderate. He has tried analgesics for the symptoms. The treatment provided moderate relief.  Constipation This is a chronic problem. The current episode started more than 1 year ago. His stool frequency is 1 time per day. Associated symptoms include back pain. He has tried laxatives for the symptoms. The treatment provided moderate relief.     Review of Systems  Constitutional:  Positive for malaise/fatigue.  Eyes:  Negative for blurred vision.  Respiratory:  Positive for shortness of breath (when moving).   Gastrointestinal:  Positive for constipation.  Musculoskeletal:  Positive for back pain.  All other systems reviewed and are negative.     Objective:   Physical Exam Vitals reviewed.  Constitutional:      General: He is not in acute distress.    Appearance: He is well-developed. He is obese.  HENT:     Head: Normocephalic.     Right Ear: Tympanic membrane normal.     Left Ear: Tympanic membrane normal.  Eyes:     General:        Right eye: No discharge.        Left eye: No discharge.     Pupils: Pupils are equal, round, and reactive to light.  Neck:     Thyroid: No thyromegaly.  Cardiovascular:     Rate and Rhythm: Normal rate and regular rhythm.     Heart sounds: Normal heart sounds. No murmur heard. Pulmonary:     Effort: Pulmonary effort is normal. No respiratory distress.  Breath sounds: Normal breath sounds. No wheezing.  Abdominal:     General: Bowel sounds are normal. There is no distension.     Palpations: Abdomen is soft.     Tenderness: There is no abdominal tenderness.  Musculoskeletal:        General: No tenderness. Normal range of motion.     Cervical back: Normal range of motion and neck supple.     Comments:  Pain in lumbar with flexion and extension  Skin:    General: Skin is warm and dry.     Findings: No erythema or rash.  Neurological:     Mental Status: He is alert and oriented to person, place, and time.     Cranial Nerves: No cranial nerve deficit.     Deep Tendon Reflexes: Reflexes are normal and symmetric.  Psychiatric:        Behavior: Behavior normal.        Thought Content: Thought content normal.        Judgment: Judgment normal.      BP 134/71   Pulse 74   Temp (!) 97.5 F (36.4 C) (Temporal)   Ht 5' 5"  (1.651 m)   Wt 219 lb 12.8 oz (99.7 kg)   BMI 36.58 kg/m      Assessment & Plan:  Daniel Short comes in today with chief complaint of Medical Management of Chronic Issues   Diagnosis and orders addressed:  1. Hypertension associated with diabetes ([place]) - CMP14+EGFR - CBC with Differential/Platelet  2. Coronary artery disease involving native coronary artery of native heart without angina pectoris - CMP14+EGFR - CBC with Differential/Platelet  3. Type 2 diabetes mellitus with other specified complication, without long-term current use of insulin ([facility])  - CMP14+EGFR - CBC with Differential/Platelet - Bayer DCA Hb A1c Waived  4. Hyperlipidemia associated with type 2 diabetes mellitus ([place]) - CMP14+EGFR - CBC with Differential/Platelet - Lipid panel  5. Morbid obesity ([place])  - CMP14+EGFR - CBC with Differential/Platelet  6. Chronic low back pain, unspecified back pain laterality, unspecified whether sciatica present - CMP14+EGFR - CBC with Differential/Platelet   Labs pending Health Maintenance reviewed Diet and exercise encouraged  Follow up plan: 6 months    Evelina Dun, FNP

## 2021-02-11 NOTE — Patient Instructions (Signed)
Health Maintenance After Age 76 After age 76, you are at a higher risk for certain long-term diseases and infections as well as injuries from falls. Falls are a major cause of broken bones and head injuries in people who are older than age 76. Getting regular preventive care can help to keep you healthy and well. Preventive care includes getting regular testing and making lifestyle changes as recommended by your health care provider. Talk with your health care provider about: Which screenings and tests you should have. A screening is a test that checks for a disease when you have no symptoms. A diet and exercise plan that is right for you. What should I know about screenings and tests to prevent falls? Screening and testing are the best ways to find a health problem early. Early diagnosis and treatment give you the best chance of managing medical conditions that are common after age 76. Certain conditions and lifestyle choices may make you more likely to have a fall. Your health care provider may recommend: Regular vision checks. Poor vision and conditions such as cataracts can make you more likely to have a fall. If you wear glasses, make sure to get your prescription updated if your vision changes. Medicine review. Work with your health care provider to regularly review all of the medicines you are taking, including over-the-counter medicines. Ask your health care provider about any side effects that may make you more likely to have a fall. Tell your health care provider if any medicines that you take make you feel dizzy or sleepy. Strength and balance checks. Your health care provider may recommend certain tests to check your strength and balance while standing, walking, or changing positions. Foot health exam. Foot pain and numbness, as well as not wearing proper footwear, can make you more likely to have a fall. Screenings, including: Osteoporosis screening. Osteoporosis is a condition that causes  the bones to get weaker and break more easily. Blood pressure screening. Blood pressure changes and medicines to control blood pressure can make you feel dizzy. Depression screening. You may be more likely to have a fall if you have a fear of falling, feel depressed, or feel unable to do activities that you used to do. Alcohol use screening. Using too much alcohol can affect your balance and may make you more likely to have a fall. Follow these instructions at home: Lifestyle Do not drink alcohol if: Your health care provider tells you not to drink. If you drink alcohol: Limit how much you have to: 0-1 drink a day for women. 0-2 drinks a day for men. Know how much alcohol is in your drink. In the [place] one drink equals one 12 oz bottle of beer (355 mL), one 5 oz glass of wine (148 mL), or one 1 oz glass of hard liquor (44 mL). Do not use any products that contain nicotine or tobacco. These products include cigarettes, chewing tobacco, and vaping devices, such as e-cigarettes. If you need help quitting, ask your health care provider. Activity  Follow a regular exercise program to stay fit. This will help you maintain your balance. Ask your health care provider what types of exercise are appropriate for you. If you need a cane or walker, use it as recommended by your health care provider. Wear supportive shoes that have nonskid soles. Safety  Remove any tripping hazards, such as rugs, cords, and clutter. Install safety equipment such as grab bars in bathrooms and safety rails on stairs. Keep rooms and walkways   well-lit. General instructions Talk with your health care provider about your risks for falling. Tell your health care provider if: You fall. Be sure to tell your health care provider about all falls, even ones that seem minor. You feel dizzy, tiredness (fatigue), or off-balance. Take over-the-counter and prescription medicines only as told by your health care provider. These include  supplements. Eat a healthy diet and maintain a healthy weight. A healthy diet includes low-fat dairy products, low-fat (lean) meats, and fiber from whole grains, beans, and lots of fruits and vegetables. Stay current with your vaccines. Schedule regular health, dental, and eye exams. Summary Having a healthy lifestyle and getting preventive care can help to protect your health and wellness after age 76. Screening and testing are the best way to find a health problem early and help you avoid having a fall. Early diagnosis and treatment give you the best chance for managing medical conditions that are more common for people who are older than age 76. Falls are a major cause of broken bones and head injuries in people who are older than age 76. Take precautions to prevent a fall at home. Work with your health care provider to learn what changes you can make to improve your health and wellness and to prevent falls. This information is not intended to replace advice given to you by your health care provider. Make sure you discuss any questions you have with your health care provider. Document Revised: 08/03/2020 Document Reviewed: 08/03/2020 Elsevier Patient Education  2022 [employer]  

## 2021-02-12 LAB — CMP14+EGFR
ALT: 24 IU/L (ref 0–44)
AST: 24 IU/L (ref 0–40)
Albumin/Globulin Ratio: 2.2 (ref 1.2–2.2)
Albumin: 4.3 g/dL (ref 3.7–4.7)
Alkaline Phosphatase: 62 IU/L (ref 44–121)
BUN/Creatinine Ratio: 9 — ABNORMAL LOW (ref 10–24)
BUN: 9 mg/dL (ref 8–27)
Bilirubin Total: 1.7 mg/dL — ABNORMAL HIGH (ref 0.0–1.2)
CO2: 24 mmol/L (ref 20–29)
Calcium: 9.9 mg/dL (ref 8.6–10.2)
Chloride: 102 mmol/L (ref 96–106)
Creatinine, Ser: 1.02 mg/dL (ref 0.76–1.27)
Globulin, Total: 2 g/dL (ref 1.5–4.5)
Glucose: 106 mg/dL — ABNORMAL HIGH (ref 70–99)
Potassium: 4.1 mmol/L (ref 3.5–5.2)
Sodium: 138 mmol/L (ref 134–144)
Total Protein: 6.3 g/dL (ref 6.0–8.5)
eGFR: 77 mL/min/{1.73_m2} (ref >59)

## 2021-02-12 LAB — CBC WITH DIFFERENTIAL/PLATELET
Basophils Absolute: 0.1 10*3/uL (ref 0.0–0.2)
Basos: 1 %
EOS (ABSOLUTE): 0.2 10*3/uL (ref 0.0–0.4)
Eos: 2 %
Hematocrit: 42.5 % (ref 37.5–51.0)
Hemoglobin: 14.3 g/dL (ref 13.0–17.7)
Immature Grans (Abs): 0 10*3/uL (ref 0.0–0.1)
Immature Granulocytes: 0 %
Lymphocytes Absolute: 2.6 10*3/uL (ref 0.7–3.1)
Lymphs: 31 %
MCH: 30.7 pg (ref 26.6–33.0)
MCHC: 33.6 g/dL (ref 31.5–35.7)
MCV: 91 fL (ref 79–97)
Monocytes Absolute: 1 10*3/uL — ABNORMAL HIGH (ref 0.1–0.9)
Monocytes: 12 %
Neutrophils Absolute: 4.7 10*3/uL (ref 1.4–7.0)
Neutrophils: 54 %
Platelets: 197 10*3/uL (ref 150–450)
RBC: 4.66 x10E6/uL (ref 4.14–5.80)
RDW: 12.4 % (ref 11.6–15.4)
WBC: 8.6 10*3/uL (ref 3.4–10.8)

## 2021-02-12 LAB — LIPID PANEL
Chol/HDL Ratio: 3.6 ratio (ref 0.0–5.0)
Cholesterol, Total: 118 mg/dL (ref 100–199)
HDL: 33 mg/dL — ABNORMAL LOW (ref >39)
LDL Chol Calc (NIH): 61 mg/dL (ref 0–99)
Triglycerides: 132 mg/dL (ref 0–149)
VLDL Cholesterol Cal: 24 mg/dL (ref 5–40)

## 2021-03-09 DIAGNOSIS — M545 Low back pain, unspecified: Secondary | ICD-10-CM | POA: Diagnosis not present

## 2021-03-09 DIAGNOSIS — R030 Elevated blood-pressure reading, without diagnosis of hypertension: Secondary | ICD-10-CM | POA: Diagnosis not present

## 2021-03-09 DIAGNOSIS — Z79899 Other long term (current) drug therapy: Secondary | ICD-10-CM | POA: Diagnosis not present

## 2021-03-09 DIAGNOSIS — G8929 Other chronic pain: Secondary | ICD-10-CM | POA: Diagnosis not present

## 2021-04-09 DIAGNOSIS — Z79899 Other long term (current) drug therapy: Secondary | ICD-10-CM | POA: Diagnosis not present

## 2021-04-09 DIAGNOSIS — G8929 Other chronic pain: Secondary | ICD-10-CM | POA: Diagnosis not present

## 2021-04-09 DIAGNOSIS — Z9189 Other specified personal risk factors, not elsewhere classified: Secondary | ICD-10-CM | POA: Diagnosis not present

## 2021-04-09 DIAGNOSIS — M5136 Other intervertebral disc degeneration, lumbar region: Secondary | ICD-10-CM | POA: Diagnosis not present

## 2021-06-01 ENCOUNTER — Ambulatory Visit (INDEPENDENT_AMBULATORY_CARE_PROVIDER_SITE_OTHER): Payer: Medicare HMO | Admitting: Family

## 2021-06-01 ENCOUNTER — Encounter: Payer: Self-pay | Admitting: Family

## 2021-06-01 VITALS — BP 131/81 | HR 84 | Temp 97.0°F | Ht 65.0 in | Wt 221.2 lb

## 2021-06-01 DIAGNOSIS — E785 Hyperlipidemia, unspecified: Secondary | ICD-10-CM

## 2021-06-01 DIAGNOSIS — I152 Hypertension secondary to endocrine disorders: Secondary | ICD-10-CM

## 2021-06-01 DIAGNOSIS — E1169 Type 2 diabetes mellitus with other specified complication: Secondary | ICD-10-CM | POA: Diagnosis not present

## 2021-06-01 DIAGNOSIS — I2510 Atherosclerotic heart disease of native coronary artery without angina pectoris: Secondary | ICD-10-CM | POA: Diagnosis not present

## 2021-06-01 DIAGNOSIS — G8929 Other chronic pain: Secondary | ICD-10-CM | POA: Diagnosis not present

## 2021-06-01 DIAGNOSIS — E1159 Type 2 diabetes mellitus with other circulatory complications: Secondary | ICD-10-CM

## 2021-06-01 DIAGNOSIS — I4891 Unspecified atrial fibrillation: Secondary | ICD-10-CM

## 2021-06-01 DIAGNOSIS — R7989 Other specified abnormal findings of blood chemistry: Secondary | ICD-10-CM | POA: Diagnosis not present

## 2021-06-01 DIAGNOSIS — M545 Low back pain, unspecified: Secondary | ICD-10-CM | POA: Diagnosis not present

## 2021-06-01 LAB — CMP14+EGFR
ALT: 22 IU/L (ref 0–44)
AST: 20 IU/L (ref 0–40)
Albumin/Globulin Ratio: 1.8 (ref 1.2–2.2)
Albumin: 4.4 g/dL (ref 3.7–4.7)
Alkaline Phosphatase: 65 IU/L (ref 44–121)
BUN/Creatinine Ratio: 10 (ref 10–24)
BUN: 12 mg/dL (ref 8–27)
Bilirubin Total: 1.4 mg/dL — ABNORMAL HIGH (ref 0.0–1.2)
CO2: 22 mmol/L (ref 20–29)
Calcium: 9.8 mg/dL (ref 8.6–10.2)
Chloride: 103 mmol/L (ref 96–106)
Creatinine, Ser: 1.26 mg/dL (ref 0.76–1.27)
Globulin, Total: 2.5 g/dL (ref 1.5–4.5)
Glucose: 140 mg/dL — ABNORMAL HIGH (ref 70–99)
Potassium: 4.6 mmol/L (ref 3.5–5.2)
Sodium: 141 mmol/L (ref 134–144)
Total Protein: 6.9 g/dL (ref 6.0–8.5)
eGFR: 59 mL/min/{1.73_m2} — ABNORMAL LOW (ref >59)

## 2021-06-01 LAB — CBC WITH DIFFERENTIAL/PLATELET
Basophils Absolute: 0.1 10*3/uL (ref 0.0–0.2)
Basos: 1 %
EOS (ABSOLUTE): 0.2 10*3/uL (ref 0.0–0.4)
Eos: 2 %
Hematocrit: 43.1 % (ref 37.5–51.0)
Hemoglobin: 14.7 g/dL (ref 13.0–17.7)
Immature Grans (Abs): 0 10*3/uL (ref 0.0–0.1)
Immature Granulocytes: 0 %
Lymphocytes Absolute: 4 10*3/uL — ABNORMAL HIGH (ref 0.7–3.1)
Lymphs: 45 %
MCH: 30.7 pg (ref 26.6–33.0)
MCHC: 34.1 g/dL (ref 31.5–35.7)
MCV: 90 fL (ref 79–97)
Monocytes Absolute: 0.9 10*3/uL (ref 0.1–0.9)
Monocytes: 11 %
Neutrophils Absolute: 3.5 10*3/uL (ref 1.4–7.0)
Neutrophils: 41 %
Platelets: 201 10*3/uL (ref 150–450)
RBC: 4.79 x10E6/uL (ref 4.14–5.80)
RDW: 12.4 % (ref 11.6–15.4)
WBC: 8.6 10*3/uL (ref 3.4–10.8)

## 2021-06-01 LAB — BAYER DCA HB A1C WAIVED: HB A1C (BAYER DCA - WAIVED): 6.2 % — ABNORMAL HIGH (ref 4.8–5.6)

## 2021-06-01 NOTE — Progress Notes (Signed)
? ?Subjective:  ? ? Patient ID: Daniel Short, male    DOB: [birth year], 76 y.o.   MRN: [id number] ? ?Chief Complaint  ?Patient presents with  ? Medical Management of Chronic Issues  ? ?PT presents to the office today for chronic follow up. He is followed by Pain clinic for chronic back pain. Pt is followed by Cardiologists every 3 months for CAD, A Fib, and hx of MI.  ?  ?He is followed by GI for hyperbilirubinemia.  ? ?He has morbid obese with a BMI of 36 and DM and HTN.  ?Hypertension ?This is a chronic problem. The current episode started more than 1 year ago. The problem has been resolved since onset. The problem is controlled. Associated symptoms include malaise/fatigue and shortness of breath. Pertinent negatives include no blurred vision or peripheral edema. Risk factors for coronary artery disease include dyslipidemia, obesity and male gender. The current treatment provides moderate improvement.  ?Hyperlipidemia ?This is a chronic problem. The current episode started more than 1 year ago. The problem is controlled. Recent lipid tests were reviewed and are normal. Exacerbating diseases include obesity. Associated symptoms include shortness of breath. Current antihyperlipidemic treatment includes statins. The current treatment provides moderate improvement of lipids. Risk factors for coronary artery disease include dyslipidemia, diabetes mellitus, male sex, hypertension and a sedentary lifestyle.  ?Diabetes ?He presents for his follow-up diabetic visit. He has type 2 diabetes mellitus. Pertinent negatives for diabetes include no blurred vision and no foot paresthesias. Symptoms are stable. Diabetic complications include heart disease. Risk factors for coronary artery disease include dyslipidemia, diabetes mellitus, male sex, hypertension and sedentary lifestyle. (Does not check BS at home) An ACE inhibitor/angiotensin II receptor blocker is being taken. Eye exam is current.  ?Back Pain ?This is a chronic  problem. The current episode started more than 1 year ago. The problem occurs intermittently. The problem has been waxing and waning since onset. The pain is present in the lumbar spine. The quality of the pain is described as aching. The pain is at a severity of 10/10. The pain is moderate. Risk factors include obesity. He has tried analgesics and bed rest for the symptoms. The treatment provided mild relief.  ? ? ? ?Review of Systems  ?Constitutional:  Positive for malaise/fatigue.  ?Eyes:  Negative for blurred vision.  ?Respiratory:  Positive for shortness of breath.   ?Musculoskeletal:  Positive for back pain.  ?All other systems reviewed and are negative. ? ?   ?Objective:  ? Physical Exam ?Vitals reviewed.  ?Constitutional:   ?   General: He is not in acute distress. ?   Appearance: He is well-developed. He is obese.  ?HENT:  ?   Head: Normocephalic.  ?   Right Ear: Tympanic membrane normal.  ?   Left Ear: Tympanic membrane normal.  ?Eyes:  ?   General:     ?   Right eye: No discharge.     ?   Left eye: No discharge.  ?   Pupils: Pupils are equal, round, and reactive to light.  ?Neck:  ?   Thyroid: No thyromegaly.  ?Cardiovascular:  ?   Rate and Rhythm: Normal rate and regular rhythm.  ?   Heart sounds: Normal heart sounds. No murmur heard. ?Pulmonary:  ?   Effort: Pulmonary effort is normal. No respiratory distress.  ?   Breath sounds: Normal breath sounds. Decreased air movement present. No wheezing.  ?Abdominal:  ?   General: Bowel sounds are normal. There  is no distension.  ?   Palpations: Abdomen is soft.  ?   Tenderness: There is no abdominal tenderness.  ?Musculoskeletal:     ?   General: No tenderness.  ?   Cervical back: Normal range of motion and neck supple.  ?   Comments: Pain in lumbar with flexion and extension, using cane to walk  ?Skin: ?   General: Skin is warm and dry.  ?   Findings: No erythema or rash.  ?Neurological:  ?   Mental Status: He is alert and oriented to person, place, and time.   ?   Cranial Nerves: No cranial nerve deficit.  ?   Motor: Weakness present.  ?   Gait: Gait abnormal.  ?   Deep Tendon Reflexes: Reflexes are normal and symmetric.  ?Psychiatric:     ?   Behavior: Behavior normal.     ?   Thought Content: Thought content normal.     ?   Judgment: Judgment normal.  ? ? ? ? ? ?  BP 131/81   Pulse 84   Temp (!) 97 ?F (36.1 ?C) (Temporal)   Ht 5' 5"  (1.651 m)   Wt 221 lb 3.2 oz (100.3 kg)   SpO2 96%   BMI 36.81 kg/m?  ? ?Assessment & Plan:  ?[name] comes in today with chief complaint of Medical Management of Chronic Issues ? ? ?Diagnosis and orders addressed: ? ?1. Hypertension associated with diabetes ([place]) ? ?- CMP14+EGFR ?- CBC with Differential/Platelet ? ?2. Coronary artery disease involving native coronary artery of native heart without angina pectoris ?- CMP14+EGFR ?- CBC with Differential/Platelet ? ?3. Hyperlipidemia associated with type 2 diabetes mellitus ([place]) ?- CMP14+EGFR ?- CBC with Differential/Platelet ? ?4. Type 2 diabetes mellitus with other specified complication, without long-term current use of insulin ([place]) ?- Bayer DCA Hb A1c Waived ?- CMP14+EGFR ?- CBC with Differential/Platelet ? ?5. Morbid obesity ([place]) ?- CMP14+EGFR ?- CBC with Differential/Platelet ? ?6. Chronic low back pain, unspecified back pain laterality, unspecified whether sciatica present ?- CMP14+EGFR ?- CBC with Differential/Platelet ? ?7. Elevated LFTs ?- CMP14+EGFR ?- CBC with Differential/Platelet ? ?8. Atrial fibrillation with RVR ([place]) ? ?- CMP14+EGFR ?- CBC with Differential/Platelet ? ? ?Labs pending ?Health Maintenance reviewed ?Diet and exercise encouraged ? ?Follow up plan: ?4 months  ? ? ?Evelina Dun, FNP ? ? ?

## 2021-06-01 NOTE — Patient Instructions (Signed)
Health Maintenance After Age 77 After age 77, you are at a higher risk for certain long-term diseases and infections as well as injuries from falls. Falls are a major cause of broken bones and head injuries in people who are older than age 77. Getting regular preventive care can help to keep you healthy and well. Preventive care includes getting regular testing and making lifestyle changes as recommended by your health care provider. Talk with your health care provider about: Which screenings and tests you should have. A screening is a test that checks for a disease when you have no symptoms. A diet and exercise plan that is right for you. What should I know about screenings and tests to prevent falls? Screening and testing are the best ways to find a health problem early. Early diagnosis and treatment give you the best chance of managing medical conditions that are common after age 77. Certain conditions and lifestyle choices may make you more likely to have a fall. Your health care provider may recommend: Regular vision checks. Poor vision and conditions such as cataracts can make you more likely to have a fall. If you wear glasses, make sure to get your prescription updated if your vision changes. Medicine review. Work with your health care provider to regularly review all of the medicines you are taking, including over-the-counter medicines. Ask your health care provider about any side effects that may make you more likely to have a fall. Tell your health care provider if any medicines that you take make you feel dizzy or sleepy. Strength and balance checks. Your health care provider may recommend certain tests to check your strength and balance while standing, walking, or changing positions. Foot health exam. Foot pain and numbness, as well as not wearing proper footwear, can make you more likely to have a fall. Screenings, including: Osteoporosis screening. Osteoporosis is a condition that causes  the bones to get weaker and break more easily. Blood pressure screening. Blood pressure changes and medicines to control blood pressure can make you feel dizzy. Depression screening. You may be more likely to have a fall if you have a fear of falling, feel depressed, or feel unable to do activities that you used to do. Alcohol use screening. Using too much alcohol can affect your balance and may make you more likely to have a fall. Follow these instructions at home: Lifestyle Do not drink alcohol if: Your health care provider tells you not to drink. If you drink alcohol: Limit how much you have to: 0-1 drink a day for women. 0-2 drinks a day for men. Know how much alcohol is in your drink. In the [place] one drink equals one 12 oz bottle of beer (355 mL), one 5 oz glass of wine (148 mL), or one 1 oz glass of hard liquor (44 mL). Do not use any products that contain nicotine or tobacco. These products include cigarettes, chewing tobacco, and vaping devices, such as e-cigarettes. If you need help quitting, ask your health care provider. Activity  Follow a regular exercise program to stay fit. This will help you maintain your balance. Ask your health care provider what types of exercise are appropriate for you. If you need a cane or walker, use it as recommended by your health care provider. Wear supportive shoes that have nonskid soles. Safety  Remove any tripping hazards, such as rugs, cords, and clutter. Install safety equipment such as grab bars in bathrooms and safety rails on stairs. Keep rooms and walkways   well-lit. General instructions Talk with your health care provider about your risks for falling. Tell your health care provider if: You fall. Be sure to tell your health care provider about all falls, even ones that seem minor. You feel dizzy, tiredness (fatigue), or off-balance. Take over-the-counter and prescription medicines only as told by your health care provider. These include  supplements. Eat a healthy diet and maintain a healthy weight. A healthy diet includes low-fat dairy products, low-fat (lean) meats, and fiber from whole grains, beans, and lots of fruits and vegetables. Stay current with your vaccines. Schedule regular health, dental, and eye exams. Summary Having a healthy lifestyle and getting preventive care can help to protect your health and wellness after age 77. Screening and testing are the best way to find a health problem early and help you avoid having a fall. Early diagnosis and treatment give you the best chance for managing medical conditions that are more common for people who are older than age 77. Falls are a major cause of broken bones and head injuries in people who are older than age 77. Take precautions to prevent a fall at home. Work with your health care provider to learn what changes you can make to improve your health and wellness and to prevent falls. This information is not intended to replace advice given to you by your health care provider. Make sure you discuss any questions you have with your health care provider. Document Revised: 08/03/2020 Document Reviewed: 08/03/2020 Elsevier Patient Education  2022 [employer]  

## 2021-07-16 ENCOUNTER — Other Ambulatory Visit: Payer: Self-pay | Admitting: Family

## 2021-07-26 DIAGNOSIS — M545 Low back pain, unspecified: Secondary | ICD-10-CM | POA: Diagnosis not present

## 2021-07-26 DIAGNOSIS — G8929 Other chronic pain: Secondary | ICD-10-CM | POA: Diagnosis not present

## 2021-07-26 DIAGNOSIS — R030 Elevated blood-pressure reading, without diagnosis of hypertension: Secondary | ICD-10-CM | POA: Diagnosis not present

## 2021-07-26 DIAGNOSIS — E669 Obesity, unspecified: Secondary | ICD-10-CM | POA: Diagnosis not present

## 2021-07-26 DIAGNOSIS — Z79899 Other long term (current) drug therapy: Secondary | ICD-10-CM | POA: Diagnosis not present

## 2021-07-26 DIAGNOSIS — E119 Type 2 diabetes mellitus without complications: Secondary | ICD-10-CM | POA: Diagnosis not present

## 2021-08-04 ENCOUNTER — Other Ambulatory Visit: Payer: Self-pay | Admitting: Family

## 2021-08-04 DIAGNOSIS — E118 Type 2 diabetes mellitus with unspecified complications: Secondary | ICD-10-CM

## 2021-08-10 ENCOUNTER — Other Ambulatory Visit: Payer: Self-pay | Admitting: Family

## 2021-08-10 DIAGNOSIS — M109 Gout, unspecified: Secondary | ICD-10-CM

## 2021-08-10 DIAGNOSIS — I152 Hypertension secondary to endocrine disorders: Secondary | ICD-10-CM

## 2021-08-17 ENCOUNTER — Other Ambulatory Visit: Payer: Self-pay | Admitting: Physician Assistant

## 2021-09-04 ENCOUNTER — Emergency Department (HOSPITAL_COMMUNITY): Payer: Medicare HMO

## 2021-09-04 ENCOUNTER — Other Ambulatory Visit: Payer: Self-pay

## 2021-09-04 ENCOUNTER — Observation Stay (HOSPITAL_COMMUNITY)
Admission: EM | Admit: 2021-09-04 | Discharge: 2021-09-07 | Disposition: A | Discharge status: Home / Self Care | Payer: Medicare HMO | Attending: General Surgery | Admitting: General Surgery

## 2021-09-04 ENCOUNTER — Encounter (HOSPITAL_COMMUNITY): Payer: Self-pay | Admitting: *Deleted

## 2021-09-04 DIAGNOSIS — E119 Type 2 diabetes mellitus without complications: Secondary | ICD-10-CM | POA: Diagnosis not present

## 2021-09-04 DIAGNOSIS — E785 Hyperlipidemia, unspecified: Secondary | ICD-10-CM | POA: Diagnosis not present

## 2021-09-04 DIAGNOSIS — Z7982 Long term (current) use of aspirin: Secondary | ICD-10-CM | POA: Insufficient documentation

## 2021-09-04 DIAGNOSIS — E1169 Type 2 diabetes mellitus with other specified complication: Secondary | ICD-10-CM | POA: Diagnosis not present

## 2021-09-04 DIAGNOSIS — R1013 Epigastric pain: Secondary | ICD-10-CM | POA: Diagnosis not present

## 2021-09-04 DIAGNOSIS — R52 Pain, unspecified: Secondary | ICD-10-CM | POA: Diagnosis not present

## 2021-09-04 DIAGNOSIS — Z955 Presence of coronary angioplasty implant and graft: Secondary | ICD-10-CM | POA: Diagnosis not present

## 2021-09-04 DIAGNOSIS — R1084 Generalized abdominal pain: Secondary | ICD-10-CM | POA: Diagnosis not present

## 2021-09-04 DIAGNOSIS — Z79899 Other long term (current) drug therapy: Secondary | ICD-10-CM | POA: Diagnosis not present

## 2021-09-04 DIAGNOSIS — E1159 Type 2 diabetes mellitus with other circulatory complications: Secondary | ICD-10-CM | POA: Diagnosis not present

## 2021-09-04 DIAGNOSIS — I10 Essential (primary) hypertension: Secondary | ICD-10-CM | POA: Insufficient documentation

## 2021-09-04 DIAGNOSIS — K8012 Calculus of gallbladder with acute and chronic cholecystitis without obstruction: Principal | ICD-10-CM | POA: Insufficient documentation

## 2021-09-04 DIAGNOSIS — Z87891 Personal history of nicotine dependence: Secondary | ICD-10-CM | POA: Diagnosis not present

## 2021-09-04 DIAGNOSIS — I152 Hypertension secondary to endocrine disorders: Secondary | ICD-10-CM | POA: Diagnosis not present

## 2021-09-04 DIAGNOSIS — I2510 Atherosclerotic heart disease of native coronary artery without angina pectoris: Secondary | ICD-10-CM | POA: Insufficient documentation

## 2021-09-04 DIAGNOSIS — K8001 Calculus of gallbladder with acute cholecystitis with obstruction: Secondary | ICD-10-CM | POA: Diagnosis not present

## 2021-09-04 DIAGNOSIS — K8000 Calculus of gallbladder with acute cholecystitis without obstruction: Secondary | ICD-10-CM | POA: Diagnosis not present

## 2021-09-04 DIAGNOSIS — I252 Old myocardial infarction: Secondary | ICD-10-CM | POA: Insufficient documentation

## 2021-09-04 DIAGNOSIS — I480 Paroxysmal atrial fibrillation: Secondary | ICD-10-CM | POA: Diagnosis present

## 2021-09-04 DIAGNOSIS — I639 Cerebral infarction, unspecified: Secondary | ICD-10-CM | POA: Diagnosis not present

## 2021-09-04 DIAGNOSIS — K658 Other peritonitis: Secondary | ICD-10-CM | POA: Insufficient documentation

## 2021-09-04 DIAGNOSIS — K8020 Calculus of gallbladder without cholecystitis without obstruction: Secondary | ICD-10-CM | POA: Diagnosis present

## 2021-09-04 DIAGNOSIS — R1010 Upper abdominal pain, unspecified: Secondary | ICD-10-CM | POA: Diagnosis not present

## 2021-09-04 DIAGNOSIS — K821 Hydrops of gallbladder: Secondary | ICD-10-CM

## 2021-09-04 DIAGNOSIS — R109 Unspecified abdominal pain: Secondary | ICD-10-CM | POA: Diagnosis present

## 2021-09-04 DIAGNOSIS — E669 Obesity, unspecified: Secondary | ICD-10-CM | POA: Diagnosis present

## 2021-09-04 DIAGNOSIS — R079 Chest pain, unspecified: Secondary | ICD-10-CM | POA: Diagnosis not present

## 2021-09-04 DIAGNOSIS — K8080 Other cholelithiasis without obstruction: Secondary | ICD-10-CM

## 2021-09-04 DIAGNOSIS — Z7984 Long term (current) use of oral hypoglycemic drugs: Secondary | ICD-10-CM | POA: Insufficient documentation

## 2021-09-04 LAB — CBC WITH DIFFERENTIAL/PLATELET
Abs Immature Granulocytes: 0.05 10*3/uL (ref 0.00–0.07)
Basophils Absolute: 0 10*3/uL (ref 0.0–0.1)
Basophils Relative: 0 %
Eosinophils Absolute: 0 10*3/uL (ref 0.0–0.5)
Eosinophils Relative: 0 %
HCT: 40.9 % (ref 39.0–52.0)
Hemoglobin: 13.9 g/dL (ref 13.0–17.0)
Immature Granulocytes: 0 %
Lymphocytes Relative: 12 %
Lymphs Abs: 1.4 10*3/uL (ref 0.7–4.0)
MCH: 31.4 pg (ref 26.0–34.0)
MCHC: 34 g/dL (ref 30.0–36.0)
MCV: 92.3 fL (ref 80.0–100.0)
Monocytes Absolute: 0.7 10*3/uL (ref 0.1–1.0)
Monocytes Relative: 6 %
Neutro Abs: 9.4 10*3/uL — ABNORMAL HIGH (ref 1.7–7.7)
Neutrophils Relative %: 82 %
Platelets: 175 10*3/uL (ref 150–400)
RBC: 4.43 MIL/uL (ref 4.22–5.81)
RDW: 12.4 % (ref 11.5–15.5)
WBC: 11.6 10*3/uL — ABNORMAL HIGH (ref 4.0–10.5)
nRBC: 0 % (ref 0.0–0.2)

## 2021-09-04 LAB — TROPONIN I (HIGH SENSITIVITY)
Troponin I (High Sensitivity): 16 ng/L (ref <18)
Troponin I (High Sensitivity): 17 ng/L (ref <18)

## 2021-09-04 LAB — COMPREHENSIVE METABOLIC PANEL
ALT: 24 U/L (ref 0–44)
AST: 23 U/L (ref 15–41)
Albumin: 4 g/dL (ref 3.5–5.0)
Alkaline Phosphatase: 49 U/L (ref 38–126)
Anion gap: 5 (ref 5–15)
BUN: 10 mg/dL (ref 8–23)
CO2: 27 mmol/L (ref 22–32)
Calcium: 8.8 mg/dL — ABNORMAL LOW (ref 8.9–10.3)
Chloride: 102 mmol/L (ref 98–111)
Creatinine, Ser: 0.97 mg/dL (ref 0.61–1.24)
GFR, Estimated: >60 mL/min (ref >60)
Glucose, Bld: 158 mg/dL — ABNORMAL HIGH (ref 70–99)
Potassium: 4.3 mmol/L (ref 3.5–5.1)
Sodium: 134 mmol/L — ABNORMAL LOW (ref 135–145)
Total Bilirubin: 2.7 mg/dL — ABNORMAL HIGH (ref 0.3–1.2)
Total Protein: 7.1 g/dL (ref 6.5–8.1)

## 2021-09-04 LAB — LIPASE, BLOOD: Lipase: 130 U/L — ABNORMAL HIGH (ref 11–51)

## 2021-09-04 LAB — GLUCOSE, CAPILLARY
Glucose-Capillary: 127 mg/dL — ABNORMAL HIGH (ref 70–99)
Glucose-Capillary: 152 mg/dL — ABNORMAL HIGH (ref 70–99)

## 2021-09-04 MED ORDER — PANTOPRAZOLE SODIUM 40 MG IV SOLR
40.0000 mg | Freq: Two times a day (BID) | INTRAVENOUS | Status: DC
  Administered 2021-09-04 – 2021-09-07 (×6): 40 mg via INTRAVENOUS
  Filled 2021-09-04 (×6): qty 10

## 2021-09-04 MED ORDER — PIPERACILLIN-TAZOBACTAM 3.375 G IVPB
3.3750 g | Freq: Three times a day (TID) | INTRAVENOUS | Status: DC
Start: 2021-09-04 — End: 2021-09-07
  Administered 2021-09-04 – 2021-09-07 (×7): 3.375 g via INTRAVENOUS
  Filled 2021-09-04 (×6): qty 50

## 2021-09-04 MED ORDER — INSULIN ASPART 100 UNIT/ML IJ SOLN: 0.0000 [IU] | Freq: Every day | INTRAMUSCULAR | Status: DC

## 2021-09-04 MED ORDER — ACETAMINOPHEN 650 MG RE SUPP: 650.0000 mg | Freq: Four times a day (QID) | RECTAL | Status: DC | PRN

## 2021-09-04 MED ORDER — ACETAMINOPHEN 325 MG PO TABS
650.0000 mg | ORAL_TABLET | Freq: Four times a day (QID) | ORAL | Status: DC | PRN
  Administered 2021-09-05: 650 mg via ORAL
  Filled 2021-09-04: qty 2

## 2021-09-04 MED ORDER — HYDROMORPHONE HCL 1 MG/ML IJ SOLN
1.0000 mg | Freq: Once | INTRAMUSCULAR | Status: AC
  Administered 2021-09-04: 1 mg via INTRAVENOUS
  Filled 2021-09-04: qty 1

## 2021-09-04 MED ORDER — METOPROLOL TARTRATE 25 MG PO TABS
25.0000 mg | ORAL_TABLET | Freq: Two times a day (BID) | ORAL | Status: DC
  Administered 2021-09-04 – 2021-09-07 (×5): 25 mg via ORAL
  Filled 2021-09-04 (×6): qty 1

## 2021-09-04 MED ORDER — FENTANYL CITRATE PF 50 MCG/ML IJ SOSY
12.5000 ug | PREFILLED_SYRINGE | INTRAMUSCULAR | Status: DC | PRN
  Administered 2021-09-05: 25 ug via INTRAVENOUS
  Administered 2021-09-05: 12.5 ug via INTRAVENOUS
  Administered 2021-09-06: 50 ug via INTRAVENOUS
  Filled 2021-09-04 (×5): qty 1

## 2021-09-04 MED ORDER — HYDRALAZINE HCL 20 MG/ML IJ SOLN
10.0000 mg | Freq: Four times a day (QID) | INTRAMUSCULAR | Status: DC | PRN
Start: 2021-09-04 — End: 2021-09-07

## 2021-09-04 MED ORDER — MORPHINE SULFATE (PF) 4 MG/ML IV SOLN
4.0000 mg | Freq: Once | INTRAVENOUS | Status: AC
  Administered 2021-09-04: 4 mg via INTRAVENOUS
  Filled 2021-09-04: qty 1

## 2021-09-04 MED ORDER — ONDANSETRON HCL 4 MG/2ML IJ SOLN
4.0000 mg | Freq: Four times a day (QID) | INTRAMUSCULAR | Status: DC | PRN
  Administered 2021-09-04 – 2021-09-06 (×2): 4 mg via INTRAVENOUS
  Filled 2021-09-04 (×2): qty 2

## 2021-09-04 MED ORDER — OXYCODONE HCL 5 MG PO TABS
10.0000 mg | ORAL_TABLET | ORAL | Status: DC | PRN
  Administered 2021-09-04 – 2021-09-06 (×4): 10 mg via ORAL
  Filled 2021-09-04 (×4): qty 2

## 2021-09-04 MED ORDER — PIPERACILLIN-TAZOBACTAM 3.375 G IVPB
3.3750 g | Freq: Once | INTRAVENOUS | Status: AC
  Administered 2021-09-04: 3.375 g via INTRAVENOUS
  Filled 2021-09-04: qty 50

## 2021-09-04 MED ORDER — INSULIN ASPART 100 UNIT/ML IJ SOLN
0.0000 [IU] | Freq: Three times a day (TID) | INTRAMUSCULAR | Status: DC
  Administered 2021-09-05 (×2): 1 [IU] via SUBCUTANEOUS
  Administered 2021-09-06: 2 [IU] via SUBCUTANEOUS
  Administered 2021-09-06 – 2021-09-07 (×2): 1 [IU] via SUBCUTANEOUS

## 2021-09-04 MED ORDER — SODIUM CHLORIDE 0.9 % IV SOLN: INTRAVENOUS | Status: DC

## 2021-09-04 MED ORDER — SODIUM CHLORIDE 0.9 % IV SOLN: Freq: Once | INTRAVENOUS | Status: AC

## 2021-09-04 NOTE — ED Notes (Signed)
Informed hospitalist,  [place], of Dr. Constance Haw, with general surgery, that she would like to hold blood thinners for pt.

## 2021-09-04 NOTE — Progress Notes (Signed)
Pharmacy Antibiotic Note  Daniel Short a 76 y.o. male admitted on 09/04/2021 with  intra abdominal infection .  Pharmacy has been consulted for zosyn dosing.  Plan: Zosyn 3.375g IV q8h (4 hour infusion).  Medical History: Past Medical History:  Diagnosis Date   Arthritis    Basal cell carcinoma 1997   left ear tx with biopsy   CAD (coronary artery disease)    Status post proximal/ostial RCA Zeta stents 2003   Chronic back pain    Diabetes mellitus without complication ([place])    Ear infection 1979   Right ear due to wax build up   Headache    History of stroke 2000   Intracerebral hemorrhage   Myocardial infarction ([place]) 2003   Paroxysmal atrial fibrillation ([facility])     Allergies:  No Known Allergies  Filed Weights   09/04/21 0803 09/04/21 1631  Weight: 95.3 kg (210 lb) 100.1 kg (220 lb 10.9 oz)       Latest Ref Rng & Units 09/04/2021    9:40 AM 06/01/2021   10:54 AM 02/11/2021    2:12 PM  CBC  WBC 4.0 - 10.5 K/uL 11.6  8.6  8.6   Hemoglobin 13.0 - 17.0 g/dL 13.9  14.7  14.3   Hematocrit 39.0 - 52.0 % 40.9  43.1  42.5   Platelets 150 - 400 K/uL 175  201  197      Estimated Creatinine Clearance: 71.8 mL/min (by C-G formula based on SCr of 0.97 mg/dL).  Antibiotics Given (last 72 hours)     Date/Time Action Medication Dose Rate   09/04/21 1438 New Bag/Given   piperacillin-tazobactam (ZOSYN) IVPB 3.375 g 3.375 g 12.5 mL/hr       Antimicrobials this admission:  zosyn 09/04/2021  >>   Microbiology results: 09/04/2021  MRSA PCR: sent  Thank you for allowing pharmacy to be a part of this patient's care.  Thomasenia Sales, PharmD Clinical Pharmacist

## 2021-09-04 NOTE — Progress Notes (Signed)
Patient arrived to unit complained of nausea, noted patient excessively sweating, sweat dripping down face. Blood Glucose 127, Vital signs: T-98.3, BP-137/82, P-77, R-20, O2-97% Room air. MD Memon made aware.

## 2021-09-04 NOTE — ED Notes (Signed)
Per Dr. Constance Haw with general surgery, verbal order to hold blood thinners for this pt

## 2021-09-04 NOTE — ED Triage Notes (Signed)
Pt c/o epigastric pain x 2 days; pt states he had a gallbladder attack last year and the symptoms feel the same

## 2021-09-04 NOTE — H&P (Signed)
History and Physical    Daniel Short MWN:[id number] DOB: Sep 03, 1945 DOA: 09/04/2021  PCP: Sharion Balloon, FNP  Patient coming from: Home  I have personally briefly reviewed patient's old medical records in [place]  Chief Complaint: Abdominal pain  HPI: Daniel Short is a 76 y.o. male with medical history significant of paroxysmal atrial fibrillation, hypertension, hyperlipidemia, diabetes, cholelithiasis.  He was admitted to the hospital approximately a year ago with abdominal pain was found to have choledocholithiasis.  He was treated by ERCP and stent placement.  He was advised that he would need follow-up for cholecystectomy.  Patient admits that he did not follow-up with surgery.  He was reportedly in his usual state of health when he began having epigastric abdominal pain that began on Thursday.  This began rating down to his periumbilical area.  He did not have any fever.  He did have nausea and associated dry heaves yesterday and today.  His last bowel movement was on Thursday and reportedly had some bright red blood.  No further bowel movement since then.  He is unable to keep anything down.  He is unsure whether his pain is worsened with p.o. intake.  Denies any recent NSAID use.  He does not drink alcohol.  ED Course: On arrival to the emergency room, he is noted to be complaining of significant abdominal pain.  He is afebrile.  Hemodynamics otherwise stable.  He has mild leukocytosis.  Bilirubin is mildly elevated, transaminases normal.  Mild elevation of lipase.  Right upper quadrant ultrasound does show cholelithiasis with some gallbladder wall thickening.  Review of Systems: As per HPI otherwise 10 point review of systems negative.    Past Medical History:  Diagnosis Date   Arthritis    Basal cell carcinoma 1997   left ear tx with biopsy   CAD (coronary artery disease)    Status post proximal/ostial RCA Zeta stents 2003   Chronic back pain    Diabetes mellitus  without complication ([place])    Ear infection 1979   Right ear due to wax build up   Headache    History of stroke 2000   Intracerebral hemorrhage   Myocardial infarction ([place]) 2003   Paroxysmal atrial fibrillation ([place])     Past Surgical History:  Procedure Laterality Date   BILIARY STENT PLACEMENT N/A 02/06/2020   Procedure: BILIARY STENT PLACEMENT;  Surgeon: Rogene Houston, MD;  Location: [facility];  Service: Endoscopy;  Laterality: N/A;   CARPAL TUNNEL RELEASE Left    CORONARY STENT PLACEMENT  2005   ERCP N/A 02/06/2020   Procedure: ENDOSCOPIC RETROGRADE CHOLANGIOPANCREATOGRAPHY (ERCP) with stone extraction;  Surgeon: Rogene Houston, MD;  Location: [facility];  Service: Endoscopy;  Laterality: N/A;  1:15   ERCP N/A 05/27/2020   Procedure: ENDOSCOPIC RETROGRADE CHOLANGIOPANCREATOGRAPHY (ERCP) WITH PROPOFOL;  Surgeon: Rogene Houston, MD;  Location: [facility];  Service: Endoscopy;  Laterality: N/A;   GASTROINTESTINAL STENT REMOVAL N/A 05/27/2020   Procedure: STENT REMOVAL;  Surgeon: Rogene Houston, MD;  Location: [facility];  Service: Endoscopy;  Laterality: N/A;   HERNIA REPAIR     ventral   KNEE ARTHROSCOPY Right    LUMBAR LAMINECTOMY/DECOMPRESSION MICRODISCECTOMY N/A 09/14/2016   Procedure: Decompression L3-5, insitu fusion L4-5;  Surgeon: Melina Schools, MD;  Location: [place];  Service: Orthopedics;  Laterality: N/A;  3 hrs   LUMBAR LAMINECTOMY/DECOMPRESSION MICRODISCECTOMY N/A 09/17/2016   Procedure: WOUND EXPLORATION, GILL DECOMPRESSION,FAR LATERAL DISCECTOMY LEFT L4-L5, AND EVACUATION OF  HEMATOMA;  Surgeon: Melina Schools, MD;  Location: [place];  Service: Orthopedics;  Laterality: N/A;   SPHINCTEROTOMY N/A 02/06/2020   Procedure: SPHINCTEROTOMY;  Surgeon: Rogene Houston, MD;  Location: [facility];  Service: Endoscopy;  Laterality: N/A;    Social History:  reports that he quit smoking about 6 years ago. His smoking use included cigarettes. He has a 13.50 pack-year smoking history. He has never  used smokeless tobacco. He reports that he does not drink alcohol and does not use drugs.  No Known Allergies  Family History  Problem Relation Age of Onset   Heart attack Mother    Heart attack Father    COPD Father    Heart attack Brother    Colon cancer Neg Hx     Prior to Admission medications   Medication Sig Start Date End Date Taking? Authorizing Provider  allopurinol (ZYLOPRIM) 100 MG tablet TAKE 1 TABLET ONCE DAILY FOR GOUT Patient taking differently: Take 100 mg by mouth daily. 08/10/21  Yes Hawks, Christy A, FNP  aspirin EC 81 MG tablet Take 1 tablet (81 mg total) by mouth daily. Swallow whole. 07/27/20  Yes Satira Sark, MD  atorvastatin (LIPITOR) 20 MG tablet TAKE 1 TABLET ONCE DAILY Patient taking differently: Take 20 mg by mouth daily. 07/16/21  Yes Hawks, Christy A, FNP  HYDROcodone-acetaminophen (NORCO/VICODIN) 5-325 MG tablet Take 1 tablet by mouth every 6 (six) hours as needed for moderate pain.  02/13/18  Yes [provider]  lisinopril (ZESTRIL) 20 MG tablet TAKE 1 TABLET DAILY Patient taking differently: Take 20 mg by mouth daily. 08/10/21  Yes Hawks, Christy A, FNP  metFORMIN (GLUCOPHAGE-XR) 500 MG 24 hr tablet TAKE 1 TABLET DAILY WITHH BREAKFAST Patient taking differently: Take 500 mg by mouth daily with breakfast. 08/04/21  Yes Hawks, Christy A, FNP  metoprolol tartrate (LOPRESSOR) 25 MG tablet TAKE ONE TABLET EVERY MORNING AND ONE-HALF EVERY EVENING Patient taking differently: Take 37.5 mg by mouth daily. 08/17/21  Yes Satira Sark, MD  Polyethyl Glycol-Propyl Glycol (SYSTANE OP) Place 1 drop into both eyes 2 (two) times daily as needed (dry eyes).   Yes [provider]  blood glucose meter kit and supplies Dispense based on patient and insurance preference. Use up to four times daily as directed. (FOR ICD-10 E10.9, E11.9). 04/12/18   Evelina Dun A, FNP  Blood Glucose Monitoring Suppl ([place]) w/Device KIT Use to test  blood sugar daily as directed. DX: E11.65 11/13/19   Evelina Dun A, FNP  glucose blood (ONETOUCH VERIO) test strip Use to test blood sugar daily as directed. DX: E11.65 11/13/19   Sharion Balloon, FNP  OneTouch Delica Lancets 05L MISC Use to test blood sugar daily as directed. DX: E11.65 11/13/19   Sharion Balloon, FNP    Physical Exam: Vitals:   09/04/21 1301 09/04/21 1330 09/04/21 1530 09/04/21 1631  BP: 137/78 (!) 148/83 (!) 155/88 137/82  Pulse: 60 68 63 77  Resp: 18 16 19 20   Temp:   98 F (36.7 C) 98.3 F (36.8 C)  TempSrc:   Oral Oral  SpO2: 99% 99% 96% 97%  Weight:    100.1 kg  Height:    5' 6"  (1.676 m)    Constitutional: NAD, calm, comfortable Eyes: PERRL, lids and conjunctivae normal ENMT: Mucous membranes are moist. Posterior pharynx clear of any exudate or lesions.Normal dentition.  Neck: normal, supple, no masses, no thyromegaly Respiratory: clear to auscultation bilaterally, no wheezing, no  crackles. Normal respiratory effort. No accessory muscle use.  Cardiovascular: Regular rate and rhythm, no murmurs / rubs / gallops. No extremity edema. 2+ pedal pulses. No carotid bruits.  Abdomen: Tenderness is present mostly in epigastrium, right upper quadrant, no masses palpated. No hepatosplenomegaly. Bowel sounds positive.  Musculoskeletal: no clubbing / cyanosis. No joint deformity upper and lower extremities. Good ROM, no contractures. Normal muscle tone.  Skin: no rashes, lesions, ulcers. No induration Neurologic: CN 2-12 grossly intact. Sensation intact, DTR normal. Strength 5/5 in all 4.  Psychiatric: Normal judgment and insight. Alert and oriented x 3. Normal mood.    Labs on Admission: I have personally reviewed following labs and imaging studies  CBC: Recent Labs  Lab 09/04/21 0940  WBC 11.6*  NEUTROABS 9.4*  HGB 13.9  HCT 40.9  MCV 92.3  PLT 767   Basic Metabolic Panel: Recent Labs  Lab 09/04/21 0940  NA 134*  K 4.3  CL 102  CO2 27  GLUCOSE  158*  BUN 10  CREATININE 0.97  CALCIUM 8.8*   GFR: Estimated Creatinine Clearance: 71.8 mL/min (by C-G formula based on SCr of 0.97 mg/dL). Liver Function Tests: Recent Labs  Lab 09/04/21 0940  AST 23  ALT 24  ALKPHOS 49  BILITOT 2.7*  PROT 7.1  ALBUMIN 4.0   Recent Labs  Lab 09/04/21 0940  LIPASE 130*   No results for input(s): "AMMONIA" in the last 168 hours. Coagulation Profile: No results for input(s): "INR", "PROTIME" in the last 168 hours. Cardiac Enzymes: No results for input(s): "CKTOTAL", "CKMB", "CKMBINDEX", "TROPONINI" in the last 168 hours. BNP (last 3 results) No results for input(s): "PROBNP" in the last 8760 hours. HbA1C: No results for input(s): "HGBA1C" in the last 72 hours. CBG: Recent Labs  Lab 09/04/21 1650  GLUCAP 127*   Lipid Profile: No results for input(s): "CHOL", "HDL", "LDLCALC", "TRIG", "CHOLHDL", "LDLDIRECT" in the last 72 hours. Thyroid Function Tests: No results for input(s): "TSH", "T4TOTAL", "FREET4", "T3FREE", "THYROIDAB" in the last 72 hours. Anemia Panel: No results for input(s): "VITAMINB12", "FOLATE", "FERRITIN", "TIBC", "IRON", "RETICCTPCT" in the last 72 hours. Urine analysis:    Component Value Date/Time   APPEARANCEUR Cloudy (A) 01/02/2020 0955   GLUCOSEU Negative 01/02/2020 0955   BILIRUBINUR Negative 01/02/2020 0955   PROTEINUR 1+ (A) 01/02/2020 0955   UROBILINOGEN negative 04/16/2015 1527   NITRITE Negative 01/02/2020 0955   LEUKOCYTESUR Negative 01/02/2020 0955    Radiological Exams on Admission: US Abdomen Limited RUQ (LIVER/GB)  Result Date: 09/04/2021 CLINICAL DATA:  Epigastric pain for 2 days. EXAM: ULTRASOUND ABDOMEN LIMITED RIGHT UPPER QUADRANT COMPARISON:  MRI abdomen 01/03/2020 FINDINGS: Gallbladder: Gallbladder sludge and stones measuring up to 3.3 cm. Thickened gallbladder wall measuring 6 mm. No sonographic Murphy sign noted by sonographer. Common bile duct: Diameter: 6 mm Liver: No focal lesion  identified. Within normal limits in parenchymal echogenicity. Portal vein is patent on color Doppler imaging with normal direction of blood flow towards the liver. Other: None. IMPRESSION: Gallbladder sludge and stones with mild gallbladder wall thickening but no sonographic Murphy sign. Acute cholecystitis is not excluded in the appropriate clinical setting. Electronically Signed   By: Logan Bores M.D.   On: 09/04/2021 11:48   DG Chest Port 1 View  Result Date: 09/04/2021 CLINICAL DATA:  Per chart: Pt c/o epigastric pain x 2 days; pt states he had a gallbladder attack last year and the symptoms feel the same Hx of diabetes, stroke, MI EXAM: PORTABLE CHEST - 1 VIEW  COMPARISON:  08/30/2016 FINDINGS: Lungs are clear. Heart size upper limits normal. No effusion.  No pneumothorax. Visualized bones unremarkable. IMPRESSION: No acute cardiopulmonary disease. Electronically Signed   By: Lucrezia Europe M.D.   On: 09/04/2021 09:39    EKG: Independently reviewed.  Sinus rhythm without any acute ischemic changes  Assessment/Plan Active Problems:   Hyperlipidemia associated with type 2 diabetes mellitus ([place])   Hypertension associated with diabetes ([place])   Diabetes mellitus ([facility])   Abdominal pain   Cholelithiasis   Obesity (BMI 30-39.9)   Paroxysmal atrial fibrillation ([facility])     Abdominal pain, nausea and vomiting -Concern for acute cholecystitis -Right upper quadrant ultrasound with cholelithiasis and gallbladder wall thickening -General surgery consulted -We will keep on clear liquids for now, n.p.o. after midnight -Antibiotic coverage with Zosyn -Continue pain management, antiemetics, IV fluids -Repeat labs in a.m.  Diabetes -Holding oral agents -Sliding scale insulin  Paroxysmal atrial fibrillation -CHA2DS2-VASc of 5, he is not on anticoagulation due to prior history of intracranial hemorrhage -Chronically on aspirin, will hold for now in light of possible surgery -Continue  metoprolol -Currently in sinus rhythm  Hyperlipidemia -Restart statin when able to tolerate p.o.  Hypertension -Continue metoprolol -Holding lisinopril until his acute issues have resolved  Obesity -BMI 35.6  DVT prophylaxis: SCDs Code Status: Full code Family Communication: Updated his wife at the bedside Disposition Plan: Pending further surgical evaluation, likely discharge home once improved Consults called: General surgery Admission status: Observation, MedSurg  Kathie Dike MD [facility]   If 7PM-7AM, please contact night-coverage [email or web address]   09/04/2021, 6:00 PM

## 2021-09-04 NOTE — ED Provider Notes (Signed)
[facility] EMERGENCY DEPARTMENT Provider Note   CSN: [id number] Arrival date & time: 09/04/21  0757     History  Chief Complaint  Patient presents with   Abdominal Pain    Daniel Short is a 76 y.o. male.  HPI  76 year old male with past medical history of CAD status post stent, DM, gallbladder disease presents to the emergency department with upper abdominal pain that radiates into his chest.  Patient states that this started 2 days ago, initially resolved overnight however since yesterday has been persistent.  He describes it as a sharp pressure sensation in his upper abdomen that radiates into his chest.  Intermittent associated shortness of breath as well as mild nausea.  Denies any vomiting or diarrhea.  No fever.  States he had similar symptoms in the past secondary to "gallbladder attack".  Denies any cough or lower leg swelling.  It appears the patient had an episode of choledocholithiasis that required stent and stent removal little over a year ago.  He was recommended for cholecystitis however has never had that done as an outpatient.  Home Medications Prior to Admission medications   Medication Sig Start Date End Date Taking? Authorizing Provider  allopurinol (ZYLOPRIM) 100 MG tablet TAKE 1 TABLET ONCE DAILY FOR GOUT 08/10/21   Evelina Dun A, FNP  aspirin EC 81 MG tablet Take 1 tablet (81 mg total) by mouth daily. Swallow whole. 07/27/20   Satira Sark, MD  atorvastatin (LIPITOR) 20 MG tablet TAKE 1 TABLET ONCE DAILY 07/16/21   Sharion Balloon, FNP  blood glucose meter kit and supplies Dispense based on patient and insurance preference. Use up to four times daily as directed. (FOR ICD-10 E10.9, E11.9). 04/12/18   Evelina Dun A, FNP  Blood Glucose Monitoring Suppl ([place]) w/Device KIT Use to test blood sugar daily as directed. DX: E11.65 11/13/19   Evelina Dun A, FNP  glucose blood (ONETOUCH VERIO) test strip Use to test blood sugar daily as  directed. DX: E11.65 11/13/19   Evelina Dun A, FNP  HYDROcodone-acetaminophen (NORCO/VICODIN) 5-325 MG tablet Take 1 tablet by mouth every 6 (six) hours as needed for moderate pain.  02/13/18   [provider]  lisinopril (ZESTRIL) 20 MG tablet TAKE 1 TABLET DAILY 08/10/21   Evelina Dun A, FNP  metFORMIN (GLUCOPHAGE-XR) 500 MG 24 hr tablet TAKE 1 TABLET DAILY WITHH BREAKFAST 08/04/21   Evelina Dun A, FNP  metoprolol tartrate (LOPRESSOR) 25 MG tablet TAKE ONE TABLET EVERY MORNING AND ONE-HALF EVERY EVENING 08/17/21   Satira Sark, MD  OneTouch Delica Lancets 29H MISC Use to test blood sugar daily as directed. DX: E11.65 11/13/19   Sharion Balloon, FNP  Polyethyl Glycol-Propyl Glycol (SYSTANE OP) Place 1 drop into both eyes 2 (two) times daily as needed (dry eyes).    [provider]      Allergies    Patient has no known allergies.    Review of Systems   Review of Systems  Constitutional:  Positive for appetite change and fatigue. Negative for fever.  Respiratory:  Positive for shortness of breath.   Cardiovascular:  Positive for chest pain. Negative for palpitations and leg swelling.  Gastrointestinal:  Positive for abdominal pain and nausea. Negative for diarrhea and vomiting.  Musculoskeletal:  Negative for back pain.  Skin:  Negative for rash.  Neurological:  Negative for headaches.    Physical Exam Updated Vital Signs BP (!) 165/78 (BP Location: Right Arm)  Pulse 69   Temp 97.6 F (36.4 C)   Resp 18   Ht 5' 7"  (1.702 m)   Wt 95.3 kg   SpO2 97%   BMI 32.89 kg/m  Physical Exam Vitals and nursing note reviewed.  Constitutional:      General: He is not in acute distress.    Appearance: Normal appearance.  HENT:     Head: Normocephalic.     Mouth/Throat:     Mouth: Mucous membranes are moist.  Cardiovascular:     Rate and Rhythm: Normal rate.  Pulmonary:     Effort: Pulmonary effort is normal. No respiratory distress.  Abdominal:      Palpations: Abdomen is soft.     Tenderness: There is abdominal tenderness in the right upper quadrant, epigastric area and left upper quadrant. There is no guarding.  Skin:    General: Skin is warm.  Neurological:     Mental Status: He is alert and oriented to person, place, and time. Mental status is at baseline.  Psychiatric:        Mood and Affect: Mood normal.     ED Results / Procedures / Treatments   Labs (all labs ordered are listed, but only abnormal results are displayed) Labs Reviewed  CBC WITH DIFFERENTIAL/PLATELET  COMPREHENSIVE METABOLIC PANEL  LIPASE, BLOOD  TROPONIN I (HIGH SENSITIVITY)    EKG None  Radiology No results found.  Procedures Procedures    Medications Ordered in ED Medications  morphine (PF) 4 MG/ML injection 4 mg (has no administration in time range)    ED Course/ Medical Decision Making/ A&P                           Medical Decision Making Amount and/or Complexity of Data Reviewed Labs: ordered. Radiology: ordered.  Risk Prescription drug management.   76 year old male presents to the emergency department with concern for abdominal/chest pain.  Vitals are normal and stable on arrival, EKG shows no ischemic changes.  Slight improvement with morphine.  Blood work shows mild leukocytosis 11.6, lipase is slightly elevated at 130, bilirubin is higher than his chronic baseline at 2.7.  Troponins are negative.  Ultrasound shows gallbladder sludge with stone and wall thickening, possible acute cholecystitis.  Patient is afebrile.  Consulted with general surgeon Dr. Constance Haw.  There is no CBD dilation, no acute concerns for choledocholithiasis at this time.  We will plan for pain control, antibiotics and general surgery will evaluate the patient tomorrow and trend blood work.  Patients evaluation and results requires admission for further treatment and care.  Spoke with hospitalist, reviewed patient's ED course and they accept admission.   Patient agrees with admission plan, offers no new complaints and is stable/unchanged at time of admit.        Final Clinical Impression(s) / ED Diagnoses Final diagnoses:  None    Rx / DC Orders ED Discharge Orders     None         Lorelle Gibbs, DO 09/04/21 1530

## 2021-09-05 DIAGNOSIS — K821 Hydrops of gallbladder: Secondary | ICD-10-CM | POA: Diagnosis not present

## 2021-09-05 DIAGNOSIS — R1013 Epigastric pain: Secondary | ICD-10-CM | POA: Diagnosis not present

## 2021-09-05 DIAGNOSIS — E785 Hyperlipidemia, unspecified: Secondary | ICD-10-CM | POA: Diagnosis not present

## 2021-09-05 DIAGNOSIS — E1159 Type 2 diabetes mellitus with other circulatory complications: Secondary | ICD-10-CM | POA: Diagnosis not present

## 2021-09-05 DIAGNOSIS — K8001 Calculus of gallbladder with acute cholecystitis with obstruction: Secondary | ICD-10-CM

## 2021-09-05 DIAGNOSIS — I152 Hypertension secondary to endocrine disorders: Secondary | ICD-10-CM | POA: Diagnosis not present

## 2021-09-05 DIAGNOSIS — E1169 Type 2 diabetes mellitus with other specified complication: Secondary | ICD-10-CM | POA: Diagnosis not present

## 2021-09-05 DIAGNOSIS — K8000 Calculus of gallbladder with acute cholecystitis without obstruction: Secondary | ICD-10-CM | POA: Diagnosis not present

## 2021-09-05 LAB — COMPREHENSIVE METABOLIC PANEL
ALT: 20 U/L (ref 0–44)
AST: 23 U/L (ref 15–41)
Albumin: 3.8 g/dL (ref 3.5–5.0)
Alkaline Phosphatase: 49 U/L (ref 38–126)
Anion gap: 10 (ref 5–15)
BUN: 11 mg/dL (ref 8–23)
CO2: 25 mmol/L (ref 22–32)
Calcium: 8.6 mg/dL — ABNORMAL LOW (ref 8.9–10.3)
Chloride: 101 mmol/L (ref 98–111)
Creatinine, Ser: 1.05 mg/dL (ref 0.61–1.24)
GFR, Estimated: >60 mL/min (ref >60)
Glucose, Bld: 133 mg/dL — ABNORMAL HIGH (ref 70–99)
Potassium: 4.1 mmol/L (ref 3.5–5.1)
Sodium: 136 mmol/L (ref 135–145)
Total Bilirubin: 4 mg/dL — ABNORMAL HIGH (ref 0.3–1.2)
Total Protein: 6.7 g/dL (ref 6.5–8.1)

## 2021-09-05 LAB — CBC
HCT: 42.2 % (ref 39.0–52.0)
Hemoglobin: 14 g/dL (ref 13.0–17.0)
MCH: 31.2 pg (ref 26.0–34.0)
MCHC: 33.2 g/dL (ref 30.0–36.0)
MCV: 94 fL (ref 80.0–100.0)
Platelets: 169 10*3/uL (ref 150–400)
RBC: 4.49 MIL/uL (ref 4.22–5.81)
RDW: 12.5 % (ref 11.5–15.5)
WBC: 12.5 10*3/uL — ABNORMAL HIGH (ref 4.0–10.5)
nRBC: 0 % (ref 0.0–0.2)

## 2021-09-05 LAB — HEMOGLOBIN A1C
Hgb A1c MFr Bld: 6.2 % — ABNORMAL HIGH (ref 4.8–5.6)
Mean Plasma Glucose: 131.24 mg/dL

## 2021-09-05 LAB — GLUCOSE, CAPILLARY
Glucose-Capillary: 120 mg/dL — ABNORMAL HIGH (ref 70–99)
Glucose-Capillary: 133 mg/dL — ABNORMAL HIGH (ref 70–99)
Glucose-Capillary: 122 mg/dL — ABNORMAL HIGH (ref 70–99)
Glucose-Capillary: 112 mg/dL — ABNORMAL HIGH (ref 70–99)

## 2021-09-05 MED ORDER — BISACODYL 10 MG RE SUPP
10.0000 mg | Freq: Once | RECTAL | Status: AC
  Administered 2021-09-05: 10 mg via RECTAL
  Filled 2021-09-05: qty 1

## 2021-09-05 MED ORDER — CHLORHEXIDINE GLUCONATE CLOTH 2 % EX PADS
6.0000 | MEDICATED_PAD | Freq: Once | CUTANEOUS | Status: AC
  Administered 2021-09-05: 6 via TOPICAL

## 2021-09-05 MED ORDER — MUPIROCIN 2 % EX OINT
1.0000 "application " | TOPICAL_OINTMENT | Freq: Two times a day (BID) | CUTANEOUS | Status: DC
  Administered 2021-09-05: 1 via NASAL
  Filled 2021-09-05: qty 22

## 2021-09-05 MED ORDER — CHLORHEXIDINE GLUCONATE CLOTH 2 % EX PADS
6.0000 | MEDICATED_PAD | Freq: Once | CUTANEOUS | Status: AC
  Administered 2021-09-06: 6 via TOPICAL

## 2021-09-05 MED ORDER — SODIUM CHLORIDE 0.9 % IV SOLN
2.0000 g | INTRAVENOUS | Status: AC
  Administered 2021-09-06: 2 g via INTRAVENOUS
  Filled 2021-09-05: qty 2

## 2021-09-05 NOTE — Progress Notes (Signed)
Patient had small stool, noted blood in stool. MD Memon made aware. No new orders.

## 2021-09-05 NOTE — Progress Notes (Signed)
PROGRESS NOTE    Daniel Short  EXH:[id number] DOB: 1945/12/15 DOA: 09/04/2021 PCP: Sharion Balloon, FNP    Brief Narrative:  76 year old male admitted to the hospital with abdominal pain.  He does have history of cholelithiasis.  Bilirubin noted to be elevated on admission.  Imaging indicating cholelithiasis with gallbladder wall thickening.  He is to undergo MRCP.  Currently on IV antibiotics.  General surgery following.   Assessment & Plan:   Active Problems:   Hyperlipidemia associated with type 2 diabetes mellitus ([place])   Hypertension associated with diabetes ([place])   Diabetes mellitus ([place])   Abdominal pain   Cholelithiasis   Obesity (BMI 30-39.9)   Paroxysmal atrial fibrillation ([facility])   Hyperbilirubinemia   Abdominal pain, nausea and vomiting -Concern for acute cholecystitis -Right upper quadrant ultrasound with cholelithiasis and gallbladder wall thickening -General surgery consulted -Bilirubin up to 4.0 -Plans for MRCP in a.m., may need cholecystectomy tomorrow if MRCP negative -We will keep on clear liquids for now, n.p.o. after midnight -Antibiotic coverage with Zosyn -Continue pain management, antiemetics, IV fluids -Repeat labs in a.m.   Diabetes mellitus type II, non-insulin-dependent, controlled -Holding oral agents -Sliding scale insulin -Blood sugars have been stable   Paroxysmal atrial fibrillation -CHA2DS2-VASc of 5, he is not on anticoagulation due to prior history of intracranial hemorrhage -Refused Watchman in the past -Chronically on aspirin, will hold for now in light of possible surgery -Continue metoprolol -Currently in sinus rhythm   Hyperlipidemia -Restart statin when able to tolerate p.o.   Hypertension -Continue metoprolol -Holding lisinopril until his acute issues have resolved  Constipation -Had reportedly not had a bowel movement 3 days -We will give Dulcolax suppository -Suspected small amount of fresh blood in stools  likely hemorrhoidal. -Continue to monitor   Obesity -BMI 35.6   DVT prophylaxis: SCD's Start: 09/05/21 1427 SCDs Start: 09/04/21 1750  Code Status: Full code Family Communication: Discussed with wife at the bedside Disposition Plan: Status is: Observation The patient will require care spanning > 2 midnights and should be moved to inpatient because: We will likely do operative management of cholecystitis     Consultants:  General surgery  Procedures:    Antimicrobials:  Zosyn 6/10 >   Subjective: Abdominal pain is reportedly better today than it was on admission.  No nausea.  He did have a mild fever this morning  Objective: Vitals:   09/05/21 0610 09/05/21 0953 09/05/21 1052 09/05/21 1419  BP: 114/70 (!) 99/57 104/79 115/69  Pulse: 66 89 95 79  Resp:  '20 18 18  '$ Temp: 99.2 F (37.3 C) (!) 100.4 F (38 C) 98.7 F (37.1 C) 99 F (37.2 C)  TempSrc: Oral Oral Oral Oral  SpO2: 96% 93% 96% 95%  Weight:      Height:        Intake/Output Summary (Last 24 hours) at 09/05/2021 1832 Last data filed at 09/05/2021 1559 Gross per 24 hour  Intake 2567.71 ml  Output 600 ml  Net 1967.71 ml   Filed Weights   09/04/21 0803 09/04/21 1631  Weight: 95.3 kg 100.1 kg    Examination:  General exam: Appears calm and comfortable  Respiratory system: Clear to auscultation. Respiratory effort normal. Cardiovascular system: S1 & S2 heard, RRR. No JVD, murmurs, rubs, gallops or clicks. No pedal edema. Gastrointestinal system: Abdomen is nondistended, soft and nontender. No organomegaly or masses felt. Normal bowel sounds heard. Central nervous system: Alert and oriented. No focal neurological deficits. Extremities: Symmetric 5 x 5  power. Skin: No rashes, lesions or ulcers Psychiatry: Judgement and insight appear normal. Mood & affect appropriate.     Data Reviewed: I have personally reviewed following labs and imaging studies  CBC: Recent Labs  Lab 09/04/21 0940  09/05/21 0503  WBC 11.6* 12.5*  NEUTROABS 9.4*  --   HGB 13.9 14.0  HCT 40.9 42.2  MCV 92.3 94.0  PLT 175 774   Basic Metabolic Panel: Recent Labs  Lab 09/04/21 0940 09/05/21 0817  NA 134* 136  K 4.3 4.1  CL 102 101  CO2 27 25  GLUCOSE 158* 133*  BUN 10 11  CREATININE 0.97 1.05  CALCIUM 8.8* 8.6*   GFR: Estimated Creatinine Clearance: 66.3 mL/min (by C-G formula based on SCr of 1.05 mg/dL). Liver Function Tests: Recent Labs  Lab 09/04/21 0940 09/05/21 0817  AST 23 23  ALT 24 20  ALKPHOS 49 49  BILITOT 2.7* 4.0*  PROT 7.1 6.7  ALBUMIN 4.0 3.8   Recent Labs  Lab 09/04/21 0940  LIPASE 130*   No results for input(s): "AMMONIA" in the last 168 hours. Coagulation Profile: No results for input(s): "INR", "PROTIME" in the last 168 hours. Cardiac Enzymes: No results for input(s): "CKTOTAL", "CKMB", "CKMBINDEX", "TROPONINI" in the last 168 hours. BNP (last 3 results) No results for input(s): "PROBNP" in the last 8760 hours. HbA1C: Recent Labs    09/05/21 0503  HGBA1C 6.2*   CBG: Recent Labs  Lab 09/04/21 1650 09/04/21 2206 09/05/21 0748 09/05/21 1200 09/05/21 1645  GLUCAP 127* 152* 120* 133* 122*   Lipid Profile: No results for input(s): "CHOL", "HDL", "LDLCALC", "TRIG", "CHOLHDL", "LDLDIRECT" in the last 72 hours. Thyroid Function Tests: No results for input(s): "TSH", "T4TOTAL", "FREET4", "T3FREE", "THYROIDAB" in the last 72 hours. Anemia Panel: No results for input(s): "VITAMINB12", "FOLATE", "FERRITIN", "TIBC", "IRON", "RETICCTPCT" in the last 72 hours. Sepsis Labs: No results for input(s): "PROCALCITON", "LATICACIDVEN" in the last 168 hours.  No results found for this or any previous visit (from the past 240 hour(s)).       Radiology Studies: US Abdomen Limited RUQ (LIVER/GB)  Result Date: 09/04/2021 CLINICAL DATA:  Epigastric pain for 2 days. EXAM: ULTRASOUND ABDOMEN LIMITED RIGHT UPPER QUADRANT COMPARISON:  MRI abdomen 01/03/2020  FINDINGS: Gallbladder: Gallbladder sludge and stones measuring up to 3.3 cm. Thickened gallbladder wall measuring 6 mm. No sonographic Murphy sign noted by sonographer. Common bile duct: Diameter: 6 mm Liver: No focal lesion identified. Within normal limits in parenchymal echogenicity. Portal vein is patent on color Doppler imaging with normal direction of blood flow towards the liver. Other: None. IMPRESSION: Gallbladder sludge and stones with mild gallbladder wall thickening but no sonographic Murphy sign. Acute cholecystitis is not excluded in the appropriate clinical setting. Electronically Signed   By: Logan Bores M.D.   On: 09/04/2021 11:48   DG Chest Port 1 View  Result Date: 09/04/2021 CLINICAL DATA:  Per chart: Pt c/o epigastric pain x 2 days; pt states he had a gallbladder attack last year and the symptoms feel the same Hx of diabetes, stroke, MI EXAM: PORTABLE CHEST - 1 VIEW COMPARISON:  08/30/2016 FINDINGS: Lungs are clear. Heart size upper limits normal. No effusion.  No pneumothorax. Visualized bones unremarkable. IMPRESSION: No acute cardiopulmonary disease. Electronically Signed   By: Lucrezia Europe M.D.   On: 09/04/2021 09:39        Scheduled Meds:  Chlorhexidine Gluconate Cloth  6 each Topical Once   And   Chlorhexidine Gluconate Cloth  6  each Topical Once   insulin aspart  0-5 Units Subcutaneous QHS   insulin aspart  0-9 Units Subcutaneous TID WC   metoprolol tartrate  25 mg Oral BID   pantoprazole (PROTONIX) IV  40 mg Intravenous Q12H   Continuous Infusions:  sodium chloride 100 mL/hr at 09/05/21 1559   [START ON 09/06/2021] cefoTEtan (CEFOTAN) IV     piperacillin-tazobactam (ZOSYN)  IV 12.5 mL/hr at 09/05/21 1559     LOS: 0 days    Time spent: 35mns    JKathie Dike MD [facility]   If 7PM-7AM, please contact night-coverage [email or web address]  09/05/2021, 6:32 PM

## 2021-09-05 NOTE — Progress Notes (Signed)
Patients reported feeling SOB, vital signs T-100.4, BP-99/57, P-89, R-20, O2-93% at Room air. Noted patient sweating, more confused and restless. MD Memon made aware. Per MD hold Metoprolol due to BP 99/57.

## 2021-09-05 NOTE — Consult Note (Signed)
[facility] Consult  Reason for Consult: Hyperbilirubinemia, cholecystitis?  Referring Physician: Dr. Roderic Palau  Chief Complaint   Abdominal Pain     HPI: Daniel Short is a 76 y.o. male with a history of CAD s/p stents, DM, A fib who is now off Eliquis per his wife who had choledocholithiasis and ERCP in 01/2020. He was reluctant to get Laparoscopic cholecystectomy after that based on the documentation and his wife's statements. He came in to the ED with abdominal pain and general malaise for a few days prior to his admission. He has had nausea and no vomiting but dry heaves. He was worked up in the Ed and US demonstrated a thickened gallbladder with stones but no definitive acute cholecystitis.  The CBD was 6 mm and he did have hyperbilirubinemia at 2.7. This AM his bilirubin is up to 4.   He is on zosyn given the concern for cholecystitis and cholangitis.   The wife says that he had issue with jaundice in 2020 leading up to the ERCP in 2021.   Past Medical History:  Diagnosis Date   Arthritis    Basal cell carcinoma 1997   left ear tx with biopsy   CAD (coronary artery disease)    Status post proximal/ostial RCA Zeta stents 2003   Chronic back pain    Diabetes mellitus without complication ([place])    Ear infection 1979   Right ear due to wax build up   Headache    History of stroke 2000   Intracerebral hemorrhage   Myocardial infarction ([place]) 2003   Paroxysmal atrial fibrillation ([place])     Past Surgical History:  Procedure Laterality Date   BILIARY STENT PLACEMENT N/A 02/06/2020   Procedure: BILIARY STENT PLACEMENT;  Surgeon: Rogene Houston, MD;  Location: [facility];  Service: Endoscopy;  Laterality: N/A;   CARPAL TUNNEL RELEASE Left    CORONARY STENT PLACEMENT  2005   ERCP N/A 02/06/2020   Procedure: ENDOSCOPIC RETROGRADE CHOLANGIOPANCREATOGRAPHY (ERCP) with stone extraction;  Surgeon: Rogene Houston, MD;  Location: [facility];  Service: Endoscopy;  Laterality:  N/A;  1:15   ERCP N/A 05/27/2020   Procedure: ENDOSCOPIC RETROGRADE CHOLANGIOPANCREATOGRAPHY (ERCP) WITH PROPOFOL;  Surgeon: Rogene Houston, MD;  Location: [facility];  Service: Endoscopy;  Laterality: N/A;   GASTROINTESTINAL STENT REMOVAL N/A 05/27/2020   Procedure: STENT REMOVAL;  Surgeon: Rogene Houston, MD;  Location: [facility];  Service: Endoscopy;  Laterality: N/A;   HERNIA REPAIR     ventral   KNEE ARTHROSCOPY Right    LUMBAR LAMINECTOMY/DECOMPRESSION MICRODISCECTOMY N/A 09/14/2016   Procedure: Decompression L3-5, insitu fusion L4-5;  Surgeon: Melina Schools, MD;  Location: [place];  Service: Orthopedics;  Laterality: N/A;  3 hrs   LUMBAR LAMINECTOMY/DECOMPRESSION MICRODISCECTOMY N/A 09/17/2016   Procedure: WOUND EXPLORATION, GILL DECOMPRESSION,FAR LATERAL DISCECTOMY LEFT L4-L5, AND EVACUATION OF HEMATOMA;  Surgeon: Melina Schools, MD;  Location: [place];  Service: Orthopedics;  Laterality: N/A;   SPHINCTEROTOMY N/A 02/06/2020   Procedure: SPHINCTEROTOMY;  Surgeon: Rogene Houston, MD;  Location: [facility];  Service: Endoscopy;  Laterality: N/A;    Family History  Problem Relation Age of Onset   Heart attack Mother    Heart attack Father    COPD Father    Heart attack Brother    Colon cancer Neg Hx     Social History   Tobacco Use   Smoking status: Former    Packs/day: 0.25    Years: 54.00  Total pack years: 13.50    Types: Cigarettes    Quit date: 08/27/2015    Years since quitting: 6.0   Smokeless tobacco: Never  Vaping Use   Vaping Use: Never used  Substance Use Topics   Alcohol use: No   Drug use: No    Medications: I have reviewed the patient's current medications. Prior to Admission:  Medications Prior to Admission  Medication Sig Dispense Refill Last Dose   allopurinol (ZYLOPRIM) 100 MG tablet TAKE 1 TABLET ONCE DAILY FOR GOUT (Patient taking differently: Take 100 mg by mouth daily.) 90 tablet 0 09/03/2021   aspirin EC 81 MG tablet Take 1 tablet (81 mg total) by mouth  daily. Swallow whole. 90 tablet 3 09/03/2021   atorvastatin (LIPITOR) 20 MG tablet TAKE 1 TABLET ONCE DAILY (Patient taking differently: Take 20 mg by mouth daily.) 90 tablet 1 09/03/2021   HYDROcodone-acetaminophen (NORCO/VICODIN) 5-325 MG tablet Take 1 tablet by mouth every 6 (six) hours as needed for moderate pain.    09/03/2021   lisinopril (ZESTRIL) 20 MG tablet TAKE 1 TABLET DAILY (Patient taking differently: Take 20 mg by mouth daily.) 90 tablet 0 09/03/2021   metFORMIN (GLUCOPHAGE-XR) 500 MG 24 hr tablet TAKE 1 TABLET DAILY WITHH BREAKFAST (Patient taking differently: Take 500 mg by mouth daily with breakfast.) 90 tablet 0 09/03/2021   metoprolol tartrate (LOPRESSOR) 25 MG tablet TAKE ONE TABLET EVERY MORNING AND ONE-HALF EVERY EVENING (Patient taking differently: Take 37.5 mg by mouth daily.) 135 tablet 1 09/03/2021 at 1000   Polyethyl Glycol-Propyl Glycol (SYSTANE OP) Place 1 drop into both eyes 2 (two) times daily as needed (dry eyes).   unknown   blood glucose meter kit and supplies Dispense based on patient and insurance preference. Use up to four times daily as directed. (FOR ICD-10 E10.9, E11.9). 1 each 0    Blood Glucose Monitoring Suppl ([place]) w/Device KIT Use to test blood sugar daily as directed. DX: E11.65 1 kit 1    glucose blood (ONETOUCH VERIO) test strip Use to test blood sugar daily as directed. DX: E11.65 100 each 12    OneTouch Delica Lancets 92E MISC Use to test blood sugar daily as directed. DX: E11.65 100 each 11    Scheduled:  Chlorhexidine Gluconate Cloth  6 each Topical Once   And   Chlorhexidine Gluconate Cloth  6 each Topical Once   insulin aspart  0-5 Units Subcutaneous QHS   insulin aspart  0-9 Units Subcutaneous TID WC   metoprolol tartrate  25 mg Oral BID   pantoprazole (PROTONIX) IV  40 mg Intravenous Q12H   Continuous:  sodium chloride 100 mL/hr at 09/05/21 0953   [START ON 09/06/2021] cefoTEtan (CEFOTAN) IV     piperacillin-tazobactam (ZOSYN)   IV 3.375 g (09/05/21 1419)   FEO:[id number] **OR** acetaminophen, fentaNYL (SUBLIMAZE) injection, hydrALAZINE, ondansetron (ZOFRAN) IV, oxyCODONE  No Known Allergies   ROS:  A comprehensive review of systems was negative except for: Constitutional: positive for malaise Gastrointestinal: positive for abdominal pain and nausea  Blood pressure 104/79, pulse 95, temperature 98.7 F (37.1 C), temperature source Oral, resp. rate 18, height _0  (1.676 m), weight 100.1 kg, SpO2 96 %. Physical Exam Vitals reviewed.  Constitutional:      Appearance: He is obese.  HENT:     Head: Normocephalic.     Comments: jaundice Eyes:     Extraocular Movements: Extraocular movements intact.  Cardiovascular:     Rate and Rhythm: Normal rate.  Pulmonary:     Effort: Pulmonary effort is normal.  Abdominal:     General: There is distension.     Palpations: Abdomen is soft.     Tenderness: There is abdominal tenderness in the right upper quadrant and epigastric area.  Musculoskeletal:     Comments: Moves all extremities   Skin:    General: Skin is warm.  Neurological:     General: No focal deficit present.     Mental Status: He is alert and oriented to person, place, and time.  Psychiatric:        Mood and Affect: Mood normal.        Behavior: Behavior normal.     Results: Results for orders placed or performed during the hospital encounter of 09/04/21 (from the past 48 hour(s))  CBC with Differential     Status: Abnormal   Collection Time: 09/04/21  9:40 AM  Result Value Ref Range   WBC 11.6 (H) 4.0 - 10.5 K/uL   RBC 4.43 4.22 - 5.81 MIL/uL   Hemoglobin 13.9 13.0 - 17.0 g/dL   HCT 40.9 39.0 - 52.0 %   MCV 92.3 80.0 - 100.0 fL   MCH 31.4 26.0 - 34.0 pg   MCHC 34.0 30.0 - 36.0 g/dL   RDW 12.4 11.5 - 15.5 %   Platelets 175 150 - 400 K/uL   nRBC 0.0 0.0 - 0.2 %   Neutrophils Relative % 82 %   Neutro Abs 9.4 (H) 1.7 - 7.7 K/uL   Lymphocytes Relative 12 %   Lymphs Abs 1.4 0.7 - 4.0  K/uL   Monocytes Relative 6 %   Monocytes Absolute 0.7 0.1 - 1.0 K/uL   Eosinophils Relative 0 %   Eosinophils Absolute 0.0 0.0 - 0.5 K/uL   Basophils Relative 0 %   Basophils Absolute 0.0 0.0 - 0.1 K/uL   Immature Granulocytes 0 %   Abs Immature Granulocytes 0.05 0.00 - 0.07 K/uL    Comment: Performed at [facility], [address] [place], [place] [address]  Comprehensive metabolic panel     Status: Abnormal   Collection Time: 09/04/21  9:40 AM  Result Value Ref Range   Sodium 134 (L) 135 - 145 mmol/L   Potassium 4.3 3.5 - 5.1 mmol/L   Chloride 102 98 - 111 mmol/L   CO2 27 22 - 32 mmol/L   Glucose, Bld 158 (H) 70 - 99 mg/dL    Comment: Glucose reference range applies only to samples taken after fasting for at least 8 hours.   BUN 10 8 - 23 mg/dL   Creatinine, Ser 0.97 0.61 - 1.24 mg/dL   Calcium 8.8 (L) 8.9 - 10.3 mg/dL   Total Protein 7.1 6.5 - 8.1 g/dL   Albumin 4.0 3.5 - 5.0 g/dL   AST 23 15 - 41 U/L   ALT 24 0 - 44 U/L   Alkaline Phosphatase 49 38 - 126 U/L   Total Bilirubin 2.7 (H) 0.3 - 1.2 mg/dL   GFR, Estimated >60 >60 mL/min    Comment: (NOTE) Calculated using the CKD-EPI Creatinine Equation (2021)    Anion gap 5 5 - 15    Comment: Performed at [facility], [address], [place], [place] [address]  Lipase, blood     Status: Abnormal   Collection Time: 09/04/21  9:40 AM  Result Value Ref Range   Lipase 130 (H) 11 - 51 U/L    Comment: Performed at [facility], [address],  [place], [place] [address]  Troponin I (High Sensitivity)     Status: None   Collection Time: 09/04/21  9:40 AM  Result Value Ref Range   Troponin I (High Sensitivity) 16 <18 ng/L    Comment: (NOTE) Elevated high sensitivity troponin I (hsTnI) values and significant  changes across serial measurements may suggest ACS but many other  chronic and acute conditions are known to elevate hsTnI results.  Refer to the "Links" section for chest pain algorithms and additional   guidance. Performed at [facility], [address], [place], [place] [address]   Troponin I (High Sensitivity)     Status: None   Collection Time: 09/04/21 11:40 AM  Result Value Ref Range   Troponin I (High Sensitivity) 17 <18 ng/L    Comment: (NOTE) Elevated high sensitivity troponin I (hsTnI) values and significant  changes across serial measurements may suggest ACS but many other  chronic and acute conditions are known to elevate hsTnI results.  Refer to the "Links" section for chest pain algorithms and additional  guidance. Performed at [facility], [address], [place], [place] [address]   Glucose, capillary     Status: Abnormal   Collection Time: 09/04/21  4:50 PM  Result Value Ref Range   Glucose-Capillary 127 (H) 70 - 99 mg/dL    Comment: Glucose reference range applies only to samples taken after fasting for at least 8 hours.  Glucose, capillary     Status: Abnormal   Collection Time: 09/04/21 10:06 PM  Result Value Ref Range   Glucose-Capillary 152 (H) 70 - 99 mg/dL    Comment: Glucose reference range applies only to samples taken after fasting for at least 8 hours.  CBC     Status: Abnormal   Collection Time: 09/05/21  5:03 AM  Result Value Ref Range   WBC 12.5 (H) 4.0 - 10.5 K/uL   RBC 4.49 4.22 - 5.81 MIL/uL   Hemoglobin 14.0 13.0 - 17.0 g/dL   HCT 42.2 39.0 - 52.0 %   MCV 94.0 80.0 - 100.0 fL   MCH 31.2 26.0 - 34.0 pg   MCHC 33.2 30.0 - 36.0 g/dL   RDW 12.5 11.5 - 15.5 %   Platelets 169 150 - 400 K/uL   nRBC 0.0 0.0 - 0.2 %    Comment: Performed at [facility], [address], [place], [place] [address]  Glucose, capillary     Status: Abnormal   Collection Time: 09/05/21  7:48 AM  Result Value Ref Range   Glucose-Capillary 120 (H) 70 - 99 mg/dL    Comment: Glucose reference range applies only to samples taken after fasting for at least 8 hours.  Comprehensive metabolic panel     Status: Abnormal   Collection Time: 09/05/21  8:17 AM  Result Value Ref  Range   Sodium 136 135 - 145 mmol/L   Potassium 4.1 3.5 - 5.1 mmol/L   Chloride 101 98 - 111 mmol/L   CO2 25 22 - 32 mmol/L   Glucose, Bld 133 (H) 70 - 99 mg/dL    Comment: Glucose reference range applies only to samples taken after fasting for at least 8 hours.   BUN 11 8 - 23 mg/dL   Creatinine, Ser 1.05 0.61 - 1.24 mg/dL   Calcium 8.6 (L) 8.9 - 10.3 mg/dL   Total Protein 6.7 6.5 - 8.1 g/dL   Albumin 3.8 3.5 - 5.0 g/dL   AST 23 15 - 41 U/L   ALT 20 0 -  44 U/L   Alkaline Phosphatase 49 38 - 126 U/L   Total Bilirubin 4.0 (H) 0.3 - 1.2 mg/dL   GFR, Estimated >60 >60 mL/min    Comment: (NOTE) Calculated using the CKD-EPI Creatinine Equation (2021)    Anion gap 10 5 - 15    Comment: Performed at [facility], [address], [place], [place] [address]    Personally reviewed- Gallstones with wall thickening  US Abdomen Limited RUQ (LIVER/GB)  Result Date: 09/04/2021 CLINICAL DATA:  Epigastric pain for 2 days. EXAM: ULTRASOUND ABDOMEN LIMITED RIGHT UPPER QUADRANT COMPARISON:  MRI abdomen 01/03/2020 FINDINGS: Gallbladder: Gallbladder sludge and stones measuring up to 3.3 cm. Thickened gallbladder wall measuring 6 mm. No sonographic Murphy sign noted by sonographer. Common bile duct: Diameter: 6 mm Liver: No focal lesion identified. Within normal limits in parenchymal echogenicity. Portal vein is patent on color Doppler imaging with normal direction of blood flow towards the liver. Other: None. IMPRESSION: Gallbladder sludge and stones with mild gallbladder wall thickening but no sonographic Murphy sign. Acute cholecystitis is not excluded in the appropriate clinical setting. Electronically Signed   By: Logan Bores M.D.   On: 09/04/2021 11:48   DG Chest Port 1 View  Result Date: 09/04/2021 CLINICAL DATA:  Per chart: Pt c/o epigastric pain x 2 days; pt states he had a gallbladder attack last year and the symptoms feel the same Hx of diabetes, stroke, MI EXAM: PORTABLE CHEST - 1 VIEW  COMPARISON:  08/30/2016 FINDINGS: Lungs are clear. Heart size upper limits normal. No effusion.  No pneumothorax. Visualized bones unremarkable. IMPRESSION: No acute cardiopulmonary disease. Electronically Signed   By: Lucrezia Europe M.D.   On: 09/04/2021 09:39     Assessment & Plan:  Daniel Short is a 76 y.o. male with a history of choledocholithiasis and concern now for it again. He also has a fever, elevated LFTs and RUQ and concern for cholangitis/ cholecystitis. He is getting zosyn.   Discussed MRCP in the AM  Discussed possible laparoscopic cholecystectomy in the AM if MRCP Is negative versus needing ERCP  PLAN: I counseled the patient about the indication, risks and benefits of laparoscopic cholecystectomy.    He understands there is a very small chance for bleeding, infection, injury to normal structures (including common bile duct), conversion to open surgery, persistent symptoms, evolution of postcholecystectomy diarrhea, need for secondary interventions, anesthesia reaction, cardiopulmonary issues and other risks not specifically detailed here. I described the expected recovery, the plan for follow-up and the restrictions during the recovery phase.  All questions were answered.   All questions were answered to the satisfaction of the patient and family.   Virl Cagey 09/05/2021, 10:57 AM

## 2021-09-05 NOTE — H&P (View-Only) (Signed)
[facility] Consult  Reason for Consult: Hyperbilirubinemia, cholecystitis?  Referring Physician: Dr. Roderic Palau  Chief Complaint   Abdominal Pain     HPI: Daniel Short is a 76 y.o. male with a history of CAD s/p stents, DM, A fib who is now off Eliquis per his wife who had choledocholithiasis and ERCP in 01/2020. He was reluctant to get Laparoscopic cholecystectomy after that based on the documentation and his wife's statements. He came in to the ED with abdominal pain and general malaise for a few days prior to his admission. He has had nausea and no vomiting but dry heaves. He was worked up in the Ed and US demonstrated a thickened gallbladder with stones but no definitive acute cholecystitis.  The CBD was 6 mm and he did have hyperbilirubinemia at 2.7. This AM his bilirubin is up to 4.   He is on zosyn given the concern for cholecystitis and cholangitis.   The wife says that he had issue with jaundice in 2020 leading up to the ERCP in 2021.   Past Medical History:  Diagnosis Date   Arthritis    Basal cell carcinoma 1997   left ear tx with biopsy   CAD (coronary artery disease)    Status post proximal/ostial RCA Zeta stents 2003   Chronic back pain    Diabetes mellitus without complication ([place])    Ear infection 1979   Right ear due to wax build up   Headache    History of stroke 2000   Intracerebral hemorrhage   Myocardial infarction ([place]) 2003   Paroxysmal atrial fibrillation ([place])     Past Surgical History:  Procedure Laterality Date   BILIARY STENT PLACEMENT N/A 02/06/2020   Procedure: BILIARY STENT PLACEMENT;  Surgeon: Rogene Houston, MD;  Location: [facility];  Service: Endoscopy;  Laterality: N/A;   CARPAL TUNNEL RELEASE Left    CORONARY STENT PLACEMENT  2005   ERCP N/A 02/06/2020   Procedure: ENDOSCOPIC RETROGRADE CHOLANGIOPANCREATOGRAPHY (ERCP) with stone extraction;  Surgeon: Rogene Houston, MD;  Location: [facility];  Service: Endoscopy;  Laterality:  N/A;  1:15   ERCP N/A 05/27/2020   Procedure: ENDOSCOPIC RETROGRADE CHOLANGIOPANCREATOGRAPHY (ERCP) WITH PROPOFOL;  Surgeon: Rogene Houston, MD;  Location: [facility];  Service: Endoscopy;  Laterality: N/A;   GASTROINTESTINAL STENT REMOVAL N/A 05/27/2020   Procedure: STENT REMOVAL;  Surgeon: Rogene Houston, MD;  Location: [facility];  Service: Endoscopy;  Laterality: N/A;   HERNIA REPAIR     ventral   KNEE ARTHROSCOPY Right    LUMBAR LAMINECTOMY/DECOMPRESSION MICRODISCECTOMY N/A 09/14/2016   Procedure: Decompression L3-5, insitu fusion L4-5;  Surgeon: Melina Schools, MD;  Location: [place];  Service: Orthopedics;  Laterality: N/A;  3 hrs   LUMBAR LAMINECTOMY/DECOMPRESSION MICRODISCECTOMY N/A 09/17/2016   Procedure: WOUND EXPLORATION, GILL DECOMPRESSION,FAR LATERAL DISCECTOMY LEFT L4-L5, AND EVACUATION OF HEMATOMA;  Surgeon: Melina Schools, MD;  Location: [place];  Service: Orthopedics;  Laterality: N/A;   SPHINCTEROTOMY N/A 02/06/2020   Procedure: SPHINCTEROTOMY;  Surgeon: Rogene Houston, MD;  Location: [facility];  Service: Endoscopy;  Laterality: N/A;    Family History  Problem Relation Age of Onset   Heart attack Mother    Heart attack Father    COPD Father    Heart attack Brother    Colon cancer Neg Hx     Social History   Tobacco Use   Smoking status: Former    Packs/day: 0.25    Years: 54.00  Total pack years: 13.50    Types: Cigarettes    Quit date: 08/27/2015    Years since quitting: 6.0   Smokeless tobacco: Never  Vaping Use   Vaping Use: Never used  Substance Use Topics   Alcohol use: No   Drug use: No    Medications: I have reviewed the patient's current medications. Prior to Admission:  Medications Prior to Admission  Medication Sig Dispense Refill Last Dose   allopurinol (ZYLOPRIM) 100 MG tablet TAKE 1 TABLET ONCE DAILY FOR GOUT (Patient taking differently: Take 100 mg by mouth daily.) 90 tablet 0 09/03/2021   aspirin EC 81 MG tablet Take 1 tablet (81 mg total) by mouth  daily. Swallow whole. 90 tablet 3 09/03/2021   atorvastatin (LIPITOR) 20 MG tablet TAKE 1 TABLET ONCE DAILY (Patient taking differently: Take 20 mg by mouth daily.) 90 tablet 1 09/03/2021   HYDROcodone-acetaminophen (NORCO/VICODIN) 5-325 MG tablet Take 1 tablet by mouth every 6 (six) hours as needed for moderate pain.    09/03/2021   lisinopril (ZESTRIL) 20 MG tablet TAKE 1 TABLET DAILY (Patient taking differently: Take 20 mg by mouth daily.) 90 tablet 0 09/03/2021   metFORMIN (GLUCOPHAGE-XR) 500 MG 24 hr tablet TAKE 1 TABLET DAILY WITHH BREAKFAST (Patient taking differently: Take 500 mg by mouth daily with breakfast.) 90 tablet 0 09/03/2021   metoprolol tartrate (LOPRESSOR) 25 MG tablet TAKE ONE TABLET EVERY MORNING AND ONE-HALF EVERY EVENING (Patient taking differently: Take 37.5 mg by mouth daily.) 135 tablet 1 09/03/2021 at 1000   Polyethyl Glycol-Propyl Glycol (SYSTANE OP) Place 1 drop into both eyes 2 (two) times daily as needed (dry eyes).   unknown   blood glucose meter kit and supplies Dispense based on patient and insurance preference. Use up to four times daily as directed. (FOR ICD-10 E10.9, E11.9). 1 each 0    Blood Glucose Monitoring Suppl (ONETOUCH VERIO FLEX SYSTEM) w/Device KIT Use to test blood sugar daily as directed. DX: E11.65 1 kit 1    glucose blood (ONETOUCH VERIO) test strip Use to test blood sugar daily as directed. DX: E11.65 100 each 12    OneTouch Delica Lancets 30G MISC Use to test blood sugar daily as directed. DX: E11.65 100 each 11    Scheduled:  Chlorhexidine Gluconate Cloth  6 each Topical Once   And   Chlorhexidine Gluconate Cloth  6 each Topical Once   insulin aspart  0-5 Units Subcutaneous QHS   insulin aspart  0-9 Units Subcutaneous TID WC   metoprolol tartrate  25 mg Oral BID   pantoprazole (PROTONIX) IV  40 mg Intravenous Q12H   Continuous:  sodium chloride 100 mL/hr at 09/05/21 0953   [START ON 09/06/2021] cefoTEtan (CEFOTAN) IV     piperacillin-tazobactam (ZOSYN)   IV 3.375 g (09/05/21 1419)   PRN:acetaminophen **OR** acetaminophen, fentaNYL (SUBLIMAZE) injection, hydrALAZINE, ondansetron (ZOFRAN) IV, oxyCODONE  No Known Allergies   ROS:  A comprehensive review of systems was negative except for: Constitutional: positive for malaise Gastrointestinal: positive for abdominal pain and nausea  Blood pressure 104/79, pulse 95, temperature 98.7 F (37.1 C), temperature source Oral, resp. rate 18, height 5' 6" (1.676 m), weight 100.1 kg, SpO2 96 %. Physical Exam Vitals reviewed.  Constitutional:      Appearance: He is obese.  HENT:     Head: Normocephalic.     Comments: jaundice Eyes:     Extraocular Movements: Extraocular movements intact.  Cardiovascular:     Rate and Rhythm: Normal rate.    Pulmonary:     Effort: Pulmonary effort is normal.  Abdominal:     General: There is distension.     Palpations: Abdomen is soft.     Tenderness: There is abdominal tenderness in the right upper quadrant and epigastric area.  Musculoskeletal:     Comments: Moves all extremities   Skin:    General: Skin is warm.  Neurological:     General: No focal deficit present.     Mental Status: He is alert and oriented to person, place, and time.  Psychiatric:        Mood and Affect: Mood normal.        Behavior: Behavior normal.     Results: Results for orders placed or performed during the hospital encounter of 09/04/21 (from the past 48 hour(s))  CBC with Differential     Status: Abnormal   Collection Time: 09/04/21  9:40 AM  Result Value Ref Range   WBC 11.6 (H) 4.0 - 10.5 K/uL   RBC 4.43 4.22 - 5.81 MIL/uL   Hemoglobin 13.9 13.0 - 17.0 g/dL   HCT 40.9 39.0 - 52.0 %   MCV 92.3 80.0 - 100.0 fL   MCH 31.4 26.0 - 34.0 pg   MCHC 34.0 30.0 - 36.0 g/dL   RDW 12.4 11.5 - 15.5 %   Platelets 175 150 - 400 K/uL   nRBC 0.0 0.0 - 0.2 %   Neutrophils Relative % 82 %   Neutro Abs 9.4 (H) 1.7 - 7.7 K/uL   Lymphocytes Relative 12 %   Lymphs Abs 1.4 0.7 - 4.0  K/uL   Monocytes Relative 6 %   Monocytes Absolute 0.7 0.1 - 1.0 K/uL   Eosinophils Relative 0 %   Eosinophils Absolute 0.0 0.0 - 0.5 K/uL   Basophils Relative 0 %   Basophils Absolute 0.0 0.0 - 0.1 K/uL   Immature Granulocytes 0 %   Abs Immature Granulocytes 0.05 0.00 - 0.07 K/uL    Comment: Performed at [facility], [address] [place], [place] [address]  Comprehensive metabolic panel     Status: Abnormal   Collection Time: 09/04/21  9:40 AM  Result Value Ref Range   Sodium 134 (L) 135 - 145 mmol/L   Potassium 4.3 3.5 - 5.1 mmol/L   Chloride 102 98 - 111 mmol/L   CO2 27 22 - 32 mmol/L   Glucose, Bld 158 (H) 70 - 99 mg/dL    Comment: Glucose reference range applies only to samples taken after fasting for at least 8 hours.   BUN 10 8 - 23 mg/dL   Creatinine, Ser 0.97 0.61 - 1.24 mg/dL   Calcium 8.8 (L) 8.9 - 10.3 mg/dL   Total Protein 7.1 6.5 - 8.1 g/dL   Albumin 4.0 3.5 - 5.0 g/dL   AST 23 15 - 41 U/L   ALT 24 0 - 44 U/L   Alkaline Phosphatase 49 38 - 126 U/L   Total Bilirubin 2.7 (H) 0.3 - 1.2 mg/dL   GFR, Estimated >60 >60 mL/min    Comment: (NOTE) Calculated using the CKD-EPI Creatinine Equation (2021)    Anion gap 5 5 - 15    Comment: Performed at [facility], [address] [place], [place] [address]  Lipase, blood     Status: Abnormal   Collection Time: 09/04/21  9:40 AM  Result Value Ref Range   Lipase 130 (H) 11 - 51 U/L    Comment: Performed at  Hospital, [address]   [place], [place] [address]  Troponin I (High Sensitivity)     Status: None   Collection Time: 09/04/21  9:40 AM  Result Value Ref Range   Troponin I (High Sensitivity) 16 <18 ng/L    Comment: (NOTE) Elevated high sensitivity troponin I (hsTnI) values and significant  changes across serial measurements may suggest ACS but many other  chronic and acute conditions are known to elevate hsTnI results.  Refer to the "Links" section for chest pain algorithms and additional   guidance. Performed at [facility], [address], [place], [place] [address]   Troponin I (High Sensitivity)     Status: None   Collection Time: 09/04/21 11:40 AM  Result Value Ref Range   Troponin I (High Sensitivity) 17 <18 ng/L    Comment: (NOTE) Elevated high sensitivity troponin I (hsTnI) values and significant  changes across serial measurements may suggest ACS but many other  chronic and acute conditions are known to elevate hsTnI results.  Refer to the "Links" section for chest pain algorithms and additional  guidance. Performed at [facility], [address], [place], [place] [address]   Glucose, capillary     Status: Abnormal   Collection Time: 09/04/21  4:50 PM  Result Value Ref Range   Glucose-Capillary 127 (H) 70 - 99 mg/dL    Comment: Glucose reference range applies only to samples taken after fasting for at least 8 hours.  Glucose, capillary     Status: Abnormal   Collection Time: 09/04/21 10:06 PM  Result Value Ref Range   Glucose-Capillary 152 (H) 70 - 99 mg/dL    Comment: Glucose reference range applies only to samples taken after fasting for at least 8 hours.  CBC     Status: Abnormal   Collection Time: 09/05/21  5:03 AM  Result Value Ref Range   WBC 12.5 (H) 4.0 - 10.5 K/uL   RBC 4.49 4.22 - 5.81 MIL/uL   Hemoglobin 14.0 13.0 - 17.0 g/dL   HCT 42.2 39.0 - 52.0 %   MCV 94.0 80.0 - 100.0 fL   MCH 31.2 26.0 - 34.0 pg   MCHC 33.2 30.0 - 36.0 g/dL   RDW 12.5 11.5 - 15.5 %   Platelets 169 150 - 400 K/uL   nRBC 0.0 0.0 - 0.2 %    Comment: Performed at [facility], [address], [place], [place] [address]  Glucose, capillary     Status: Abnormal   Collection Time: 09/05/21  7:48 AM  Result Value Ref Range   Glucose-Capillary 120 (H) 70 - 99 mg/dL    Comment: Glucose reference range applies only to samples taken after fasting for at least 8 hours.  Comprehensive metabolic panel     Status: Abnormal   Collection Time: 09/05/21  8:17 AM  Result Value Ref  Range   Sodium 136 135 - 145 mmol/L   Potassium 4.1 3.5 - 5.1 mmol/L   Chloride 101 98 - 111 mmol/L   CO2 25 22 - 32 mmol/L   Glucose, Bld 133 (H) 70 - 99 mg/dL    Comment: Glucose reference range applies only to samples taken after fasting for at least 8 hours.   BUN 11 8 - 23 mg/dL   Creatinine, Ser 1.05 0.61 - 1.24 mg/dL   Calcium 8.6 (L) 8.9 - 10.3 mg/dL   Total Protein 6.7 6.5 - 8.1 g/dL   Albumin 3.8 3.5 - 5.0 g/dL   AST 23 15 - 41 U/L   ALT 20 0 -  44 U/L   Alkaline Phosphatase 49 38 - 126 U/L   Total Bilirubin 4.0 (H) 0.3 - 1.2 mg/dL   GFR, Estimated >60 >60 mL/min    Comment: (NOTE) Calculated using the CKD-EPI Creatinine Equation (2021)    Anion gap 10 5 - 15    Comment: Performed at [facility], [address], [place], [place] [address]    Personally reviewed- Gallstones with wall thickening  US Abdomen Limited RUQ (LIVER/GB)  Result Date: 09/04/2021 CLINICAL DATA:  Epigastric pain for 2 days. EXAM: ULTRASOUND ABDOMEN LIMITED RIGHT UPPER QUADRANT COMPARISON:  MRI abdomen 01/03/2020 FINDINGS: Gallbladder: Gallbladder sludge and stones measuring up to 3.3 cm. Thickened gallbladder wall measuring 6 mm. No sonographic Murphy sign noted by sonographer. Common bile duct: Diameter: 6 mm Liver: No focal lesion identified. Within normal limits in parenchymal echogenicity. Portal vein is patent on color Doppler imaging with normal direction of blood flow towards the liver. Other: None. IMPRESSION: Gallbladder sludge and stones with mild gallbladder wall thickening but no sonographic Murphy sign. Acute cholecystitis is not excluded in the appropriate clinical setting. Electronically Signed   By: Logan Bores M.D.   On: 09/04/2021 11:48   DG Chest Port 1 View  Result Date: 09/04/2021 CLINICAL DATA:  Per chart: Pt c/o epigastric pain x 2 days; pt states he had a gallbladder attack last year and the symptoms feel the same Hx of diabetes, stroke, MI EXAM: PORTABLE CHEST - 1 VIEW  COMPARISON:  08/30/2016 FINDINGS: Lungs are clear. Heart size upper limits normal. No effusion.  No pneumothorax. Visualized bones unremarkable. IMPRESSION: No acute cardiopulmonary disease. Electronically Signed   By: Lucrezia Europe M.D.   On: 09/04/2021 09:39     Assessment & Plan:  Daniel Short is a 76 y.o. male with a history of choledocholithiasis and concern now for it again. He also has a fever, elevated LFTs and RUQ and concern for cholangitis/ cholecystitis. He is getting zosyn.   Discussed MRCP in the AM  Discussed possible laparoscopic cholecystectomy in the AM if MRCP Is negative versus needing ERCP  PLAN: I counseled the patient about the indication, risks and benefits of laparoscopic cholecystectomy.    He understands there is a very small chance for bleeding, infection, injury to normal structures (including common bile duct), conversion to open surgery, persistent symptoms, evolution of postcholecystectomy diarrhea, need for secondary interventions, anesthesia reaction, cardiopulmonary issues and other risks not specifically detailed here. I described the expected recovery, the plan for follow-up and the restrictions during the recovery phase.  All questions were answered.   All questions were answered to the satisfaction of the patient and family.   Virl Cagey 09/05/2021, 10:57 AM

## 2021-09-06 ENCOUNTER — Encounter (HOSPITAL_COMMUNITY): Payer: Self-pay | Admitting: Internal Medicine

## 2021-09-06 ENCOUNTER — Other Ambulatory Visit: Payer: Self-pay

## 2021-09-06 ENCOUNTER — Observation Stay (HOSPITAL_COMMUNITY): Payer: Medicare HMO | Admitting: Anesthesiology

## 2021-09-06 ENCOUNTER — Encounter (HOSPITAL_COMMUNITY)
Admission: EM | Disposition: A | Discharge status: Home / Self Care | Payer: Self-pay | Source: Home / Self Care | Attending: Emergency Medicine

## 2021-09-06 ENCOUNTER — Observation Stay (HOSPITAL_COMMUNITY): Payer: Medicare HMO

## 2021-09-06 ENCOUNTER — Observation Stay (HOSPITAL_BASED_OUTPATIENT_CLINIC_OR_DEPARTMENT_OTHER): Payer: Medicare HMO | Admitting: Anesthesiology

## 2021-09-06 DIAGNOSIS — K8001 Calculus of gallbladder with acute cholecystitis with obstruction: Secondary | ICD-10-CM | POA: Diagnosis not present

## 2021-09-06 DIAGNOSIS — K8010 Calculus of gallbladder with chronic cholecystitis without obstruction: Secondary | ICD-10-CM | POA: Diagnosis not present

## 2021-09-06 DIAGNOSIS — R935 Abnormal findings on diagnostic imaging of other abdominal regions, including retroperitoneum: Secondary | ICD-10-CM | POA: Diagnosis not present

## 2021-09-06 DIAGNOSIS — K828 Other specified diseases of gallbladder: Secondary | ICD-10-CM | POA: Diagnosis not present

## 2021-09-06 DIAGNOSIS — Z87891 Personal history of nicotine dependence: Secondary | ICD-10-CM | POA: Diagnosis not present

## 2021-09-06 DIAGNOSIS — K821 Hydrops of gallbladder: Secondary | ICD-10-CM | POA: Diagnosis not present

## 2021-09-06 DIAGNOSIS — K838 Other specified diseases of biliary tract: Secondary | ICD-10-CM | POA: Diagnosis not present

## 2021-09-06 DIAGNOSIS — K8018 Calculus of gallbladder with other cholecystitis without obstruction: Secondary | ICD-10-CM

## 2021-09-06 DIAGNOSIS — I252 Old myocardial infarction: Secondary | ICD-10-CM | POA: Diagnosis not present

## 2021-09-06 DIAGNOSIS — I2510 Atherosclerotic heart disease of native coronary artery without angina pectoris: Secondary | ICD-10-CM | POA: Diagnosis not present

## 2021-09-06 DIAGNOSIS — K8020 Calculus of gallbladder without cholecystitis without obstruction: Secondary | ICD-10-CM | POA: Diagnosis not present

## 2021-09-06 DIAGNOSIS — K8012 Calculus of gallbladder with acute and chronic cholecystitis without obstruction: Secondary | ICD-10-CM | POA: Diagnosis not present

## 2021-09-06 HISTORY — PX: CHOLECYSTECTOMY: SHX55

## 2021-09-06 LAB — GLUCOSE, CAPILLARY
Glucose-Capillary: 126 mg/dL — ABNORMAL HIGH (ref 70–99)
Glucose-Capillary: 125 mg/dL — ABNORMAL HIGH (ref 70–99)
Glucose-Capillary: 127 mg/dL — ABNORMAL HIGH (ref 70–99)
Glucose-Capillary: 161 mg/dL — ABNORMAL HIGH (ref 70–99)

## 2021-09-06 LAB — CBC
HCT: 36.6 % — ABNORMAL LOW (ref 39.0–52.0)
Hemoglobin: 12.1 g/dL — ABNORMAL LOW (ref 13.0–17.0)
MCH: 31.2 pg (ref 26.0–34.0)
MCHC: 33.1 g/dL (ref 30.0–36.0)
MCV: 94.3 fL (ref 80.0–100.0)
Platelets: 141 10*3/uL — ABNORMAL LOW (ref 150–400)
RBC: 3.88 MIL/uL — ABNORMAL LOW (ref 4.22–5.81)
RDW: 12.9 % (ref 11.5–15.5)
WBC: 13.5 10*3/uL — ABNORMAL HIGH (ref 4.0–10.5)
nRBC: 0 % (ref 0.0–0.2)

## 2021-09-06 LAB — COMPREHENSIVE METABOLIC PANEL
ALT: 35 U/L (ref 0–44)
AST: 46 U/L — ABNORMAL HIGH (ref 15–41)
Albumin: 3.3 g/dL — ABNORMAL LOW (ref 3.5–5.0)
Alkaline Phosphatase: 47 U/L (ref 38–126)
Anion gap: 6 (ref 5–15)
BUN: 16 mg/dL (ref 8–23)
CO2: 25 mmol/L (ref 22–32)
Calcium: 8.3 mg/dL — ABNORMAL LOW (ref 8.9–10.3)
Chloride: 105 mmol/L (ref 98–111)
Creatinine, Ser: 1.31 mg/dL — ABNORMAL HIGH (ref 0.61–1.24)
GFR, Estimated: 56 mL/min — ABNORMAL LOW (ref >60)
Glucose, Bld: 121 mg/dL — ABNORMAL HIGH (ref 70–99)
Potassium: 4.1 mmol/L (ref 3.5–5.1)
Sodium: 136 mmol/L (ref 135–145)
Total Bilirubin: 4.8 mg/dL — ABNORMAL HIGH (ref 0.3–1.2)
Total Protein: 6.2 g/dL — ABNORMAL LOW (ref 6.5–8.1)

## 2021-09-06 LAB — SURGICAL PCR SCREEN
MRSA, PCR: NEGATIVE
Staphylococcus aureus: NEGATIVE

## 2021-09-06 LAB — LIPASE, BLOOD: Lipase: 27 U/L (ref 11–51)

## 2021-09-06 SURGERY — LAPAROSCOPIC CHOLECYSTECTOMY
Anesthesia: General | Site: Abdomen

## 2021-09-06 MED ORDER — LACTATED RINGERS IV SOLN: INTRAVENOUS | Status: DC

## 2021-09-06 MED ORDER — BUPIVACAINE HCL (PF) 0.5 % IJ SOLN
INTRAMUSCULAR | Status: AC
  Filled 2021-09-06: qty 30

## 2021-09-06 MED ORDER — CHLORHEXIDINE GLUCONATE 0.12 % MT SOLN: 15.0000 mL | Freq: Once | OROMUCOSAL | Status: DC

## 2021-09-06 MED ORDER — PHENYLEPHRINE 80 MCG/ML (10ML) SYRINGE FOR IV PUSH (FOR BLOOD PRESSURE SUPPORT)
PREFILLED_SYRINGE | INTRAVENOUS | Status: AC
  Filled 2021-09-06: qty 10

## 2021-09-06 MED ORDER — FENTANYL CITRATE (PF) 100 MCG/2ML IJ SOLN
INTRAMUSCULAR | Status: DC | PRN
  Administered 2021-09-06 (×2): 50 ug via INTRAVENOUS

## 2021-09-06 MED ORDER — DEXAMETHASONE SODIUM PHOSPHATE 10 MG/ML IJ SOLN
INTRAMUSCULAR | Status: AC
  Filled 2021-09-06: qty 2

## 2021-09-06 MED ORDER — ROCURONIUM BROMIDE 100 MG/10ML IV SOLN
INTRAVENOUS | Status: DC | PRN
  Administered 2021-09-06: 10 mg via INTRAVENOUS
  Administered 2021-09-06: 50 mg via INTRAVENOUS

## 2021-09-06 MED ORDER — SODIUM CHLORIDE 0.9 % IR SOLN
Status: DC | PRN
  Administered 2021-09-06: 3000 mL

## 2021-09-06 MED ORDER — DEXMEDETOMIDINE HCL IN NACL 80 MCG/20ML IV SOLN
INTRAVENOUS | Status: AC
  Filled 2021-09-06: qty 20

## 2021-09-06 MED ORDER — BUPIVACAINE HCL (PF) 0.5 % IJ SOLN
INTRAMUSCULAR | Status: DC | PRN
  Administered 2021-09-06: 30 mL

## 2021-09-06 MED ORDER — PHENYLEPHRINE HCL-NACL 20-0.9 MG/250ML-% IV SOLN
INTRAVENOUS | Status: DC | PRN
  Administered 2021-09-06: 15 ug/min via INTRAVENOUS

## 2021-09-06 MED ORDER — LIDOCAINE HCL (CARDIAC) PF 100 MG/5ML IV SOSY
PREFILLED_SYRINGE | INTRAVENOUS | Status: DC | PRN
  Administered 2021-09-06: 100 mg via INTRATRACHEAL

## 2021-09-06 MED ORDER — ONDANSETRON HCL 4 MG/2ML IJ SOLN
INTRAMUSCULAR | Status: AC
  Filled 2021-09-06: qty 8

## 2021-09-06 MED ORDER — DEXAMETHASONE SODIUM PHOSPHATE 4 MG/ML IJ SOLN
INTRAMUSCULAR | Status: DC | PRN
  Administered 2021-09-06: 5 mg via INTRAVENOUS

## 2021-09-06 MED ORDER — PROPOFOL 10 MG/ML IV BOLUS
INTRAVENOUS | Status: DC | PRN
  Administered 2021-09-06: 200 mg via INTRAVENOUS

## 2021-09-06 MED ORDER — ONDANSETRON HCL 4 MG/2ML IJ SOLN: 4.0000 mg | Freq: Once | INTRAMUSCULAR | Status: DC | PRN

## 2021-09-06 MED ORDER — HEMOSTATIC AGENTS (NO CHARGE) OPTIME
TOPICAL | Status: DC | PRN
  Administered 2021-09-06 (×2): 1 via TOPICAL

## 2021-09-06 MED ORDER — GADOBUTROL 1 MMOL/ML IV SOLN
10.0000 mL | Freq: Once | INTRAVENOUS | Status: AC | PRN
  Administered 2021-09-06: 10 mL via INTRAVENOUS

## 2021-09-06 MED ORDER — CHLORHEXIDINE GLUCONATE 0.12 % MT SOLN
OROMUCOSAL | Status: AC
  Filled 2021-09-06: qty 15

## 2021-09-06 MED ORDER — ORAL CARE MOUTH RINSE: 15.0000 mL | Freq: Once | OROMUCOSAL | Status: DC

## 2021-09-06 MED ORDER — SUGAMMADEX SODIUM 500 MG/5ML IV SOLN
INTRAVENOUS | Status: AC
  Filled 2021-09-06: qty 5

## 2021-09-06 MED ORDER — FENTANYL CITRATE (PF) 100 MCG/2ML IJ SOLN
INTRAMUSCULAR | Status: AC
  Filled 2021-09-06: qty 2

## 2021-09-06 MED ORDER — SIMETHICONE 80 MG PO CHEW: 40.0000 mg | CHEWABLE_TABLET | Freq: Four times a day (QID) | ORAL | Status: DC | PRN

## 2021-09-06 MED ORDER — EPHEDRINE SULFATE (PRESSORS) 50 MG/ML IJ SOLN
INTRAMUSCULAR | Status: DC | PRN
  Administered 2021-09-06 (×5): 5 mg via INTRAVENOUS

## 2021-09-06 MED ORDER — LIDOCAINE HCL (PF) 2 % IJ SOLN
INTRAMUSCULAR | Status: AC
  Filled 2021-09-06: qty 20

## 2021-09-06 MED ORDER — PHENYLEPHRINE HCL-NACL 20-0.9 MG/250ML-% IV SOLN
INTRAVENOUS | Status: AC
  Filled 2021-09-06: qty 250

## 2021-09-06 MED ORDER — SODIUM CHLORIDE 0.9 % IR SOLN
Status: DC | PRN
  Administered 2021-09-06: 1000 mL

## 2021-09-06 MED ORDER — LORAZEPAM 2 MG/ML IJ SOLN: 1.0000 mg | INTRAMUSCULAR | Status: DC | PRN

## 2021-09-06 MED ORDER — ROCURONIUM BROMIDE 10 MG/ML (PF) SYRINGE
PREFILLED_SYRINGE | INTRAVENOUS | Status: AC
  Filled 2021-09-06: qty 40

## 2021-09-06 MED ORDER — FENTANYL CITRATE PF 50 MCG/ML IJ SOSY
25.0000 ug | PREFILLED_SYRINGE | INTRAMUSCULAR | Status: DC | PRN
  Administered 2021-09-06 (×2): 50 ug via INTRAVENOUS

## 2021-09-06 MED ORDER — PHENYLEPHRINE HCL (PRESSORS) 10 MG/ML IV SOLN
INTRAVENOUS | Status: DC | PRN
  Administered 2021-09-06: 160 ug via INTRAVENOUS
  Administered 2021-09-06: 80 ug via INTRAVENOUS
  Administered 2021-09-06: 160 ug via INTRAVENOUS
  Administered 2021-09-06: 80 ug via INTRAVENOUS

## 2021-09-06 MED ORDER — PROPOFOL 10 MG/ML IV BOLUS
INTRAVENOUS | Status: AC
  Filled 2021-09-06: qty 20

## 2021-09-06 MED ORDER — SUGAMMADEX SODIUM 200 MG/2ML IV SOLN
INTRAVENOUS | Status: DC | PRN
  Administered 2021-09-06: 200 mg via INTRAVENOUS

## 2021-09-06 MED ORDER — ONDANSETRON HCL 4 MG/2ML IJ SOLN
INTRAMUSCULAR | Status: DC | PRN
  Administered 2021-09-06: 4 mg via INTRAVENOUS

## 2021-09-06 SURGICAL SUPPLY — 51 items
ADH SKN CLS APL DERMABOND .7 (GAUZE/BANDAGES/DRESSINGS) ×1
APL ESCP 73.6OZ SRGCL (TIP) ×1
APL PRP STRL LF DISP 70% ISPRP (MISCELLANEOUS) ×1
APPLIER CLIP ROT 10 11.4 M/L (STAPLE) ×2
APR CLP MED LRG 11.4X10 (STAPLE) ×1
BAG RETRIEVAL 10 (BASKET) ×1
BLADE SURG 15 STRL LF DISP TIS (BLADE) ×1 IMPLANT
BLADE SURG 15 STRL SS (BLADE) ×2
CHLORAPREP W/TINT 26 (MISCELLANEOUS) ×2 IMPLANT
CLIP APPLIE ROT 10 11.4 M/L (STAPLE) ×1 IMPLANT
CLOTH BEACON ORANGE TIMEOUT ST (SAFETY) ×2 IMPLANT
COVER LIGHT HANDLE STERIS (MISCELLANEOUS) ×4 IMPLANT
CUTTER FLEX LINEAR 45M (STAPLE) ×1 IMPLANT
DERMABOND ADVANCED (GAUZE/BANDAGES/DRESSINGS) ×1
DERMABOND ADVANCED .7 DNX12 (GAUZE/BANDAGES/DRESSINGS) ×1 IMPLANT
ELECT REM PT RETURN 9FT ADLT (ELECTROSURGICAL) ×2
ELECTRODE REM PT RTRN 9FT ADLT (ELECTROSURGICAL) ×1 IMPLANT
GLOVE BIO SURGEON STRL SZ 6.5 (GLOVE) ×2 IMPLANT
GLOVE BIOGEL PI IND STRL 6.5 (GLOVE) ×1 IMPLANT
GLOVE BIOGEL PI IND STRL 7.0 (GLOVE) ×2 IMPLANT
GLOVE BIOGEL PI INDICATOR 6.5 (GLOVE) ×1
GLOVE BIOGEL PI INDICATOR 7.0 (GLOVE) ×2
GLOVE SURG SS PI 7.5 STRL IVOR (GLOVE) ×1 IMPLANT
GOWN STRL REUS W/TWL LRG LVL3 (GOWN DISPOSABLE) ×6 IMPLANT
HEMOSTAT SNOW SURGICEL 2X4 (HEMOSTASIS) ×2 IMPLANT
INST SET LAPROSCOPIC AP (KITS) ×2 IMPLANT
IV NS IRRIG 3000ML ARTHROMATIC (IV SOLUTION) ×1 IMPLANT
KIT TURNOVER KIT A (KITS) ×2 IMPLANT
MANIFOLD NEPTUNE II (INSTRUMENTS) ×2 IMPLANT
NDL INSUFFLATION 14GA 120MM (NEEDLE) ×1 IMPLANT
NEEDLE INSUFFLATION 14GA 120MM (NEEDLE) ×2 IMPLANT
NS IRRIG 1000ML POUR BTL (IV SOLUTION) ×2 IMPLANT
PACK LAP CHOLE LZT030E (CUSTOM PROCEDURE TRAY) ×2 IMPLANT
PAD ARMBOARD 7.5X6 YLW CONV (MISCELLANEOUS) ×2 IMPLANT
POWDER SURGICEL 3.0 GRAM (HEMOSTASIS) ×1 IMPLANT
RELOAD 45 VASCULAR/THIN (ENDOMECHANICALS) ×2 IMPLANT
RELOAD STAPLE 45 2.5 WHT GRN (ENDOMECHANICALS) IMPLANT
SET BASIN LINEN APH (SET/KITS/TRAYS/PACK) ×2 IMPLANT
SET TUBE IRRIG SUCTION NO TIP (IRRIGATION / IRRIGATOR) ×1 IMPLANT
SET TUBE SMOKE EVAC HIGH FLOW (TUBING) ×2 IMPLANT
SLEEVE Z-THREAD 5X100MM (TROCAR) ×2 IMPLANT
SUT MNCRL AB 4-0 PS2 18 (SUTURE) ×4 IMPLANT
SUT VICRYL 0 UR6 27IN ABS (SUTURE) ×2 IMPLANT
SYS BAG RETRIEVAL 10MM (BASKET) ×1
SYSTEM BAG RETRIEVAL 10MM (BASKET) ×1 IMPLANT
TIP ENDOSCOPIC SURGICEL (TIP) ×1 IMPLANT
TROCAR Z-THRD FIOS HNDL 11X100 (TROCAR) ×2 IMPLANT
TROCAR Z-THREAD FIOS 5X100MM (TROCAR) ×2 IMPLANT
TROCAR Z-THREAD SLEEVE 11X100 (TROCAR) ×2 IMPLANT
TUBE CONNECTING 12X1/4 (SUCTIONS) ×2 IMPLANT
WARMER LAPAROSCOPE (MISCELLANEOUS) ×2 IMPLANT

## 2021-09-06 NOTE — Op Note (Signed)
Patient:  Daniel Short  DOB:  28-Aug-1945  MRN:  [id number]   Preop Diagnosis: Cholecystitis, cholelithiasis  Postop Diagnosis: Same, hydrops of gallbladder  Procedure: Laparoscopic cholecystectomy  Surgeon: Aviva Signs, MD  Assistant: Curlene Labrum, MD  Anes: General endotracheal  Indications: Patient is a 76 year old white male who presented to the operating room for laparoscopic cholecystectomy due to acute cholecystitis secondary to cholelithiasis.  The risks and benefits of the procedure including bleeding, infection, hepatobiliary injury, and the possibility of an open procedure were fully explained to the patient, who gave informed consent.  Procedure note: The patient was placed in the supine position.  After induction of general endotracheal anesthesia, the abdomen was prepped and draped using usual sterile technique with ChloraPrep.  Surgical site confirmation was performed.  A supraumbilical incision was made down to the fascia.  A Veress needle was introduced into the abdominal cavity and confirmation of placement was done using the saline drop test.  The abdomen was then insufflated to 15 mmHg pressure.  An 11 mm trocar was then introduced into the abdominal cavity under direct visualization without difficulty.  The patient was placed in reverse Trendelenburg position and an additional 11 mm trocar was placed in the epigastric region and 5 mm trocars were placed the right upper quadrant and right flank regions.  Liver was inspected noted to be somewhat enlarged but not nodular.  The gallbladder wall was noted to be thickened and distended.  In order to facilitate the dissection, the gallbladder was decompressed at the fundus.  Hydrops of the gallbladder was found.  The gallbladder was then retracted in a dynamic fashion in order to provide a critical view of the triangle of [place].  Dr. Constance Haw assisted in the retraction, dissection, and closure throughout the case.  The cystic  duct was first identified.  Its junction to the infundibulum was fully identified.  A vascular Endo GIA was placed across the cystic duct and fired.  The cystic artery was likewise ligated and divided using clips.  The gallbladder was freed away from the gallbladder fossa using Bovie electrocautery.  The gallbladder was delivered through the epigastric trocar site using an Endo Catch bag.  The gallbladder fossa was inspected and no abnormal bleeding or bile leakage was noted.  Surgicel powder and snow were then placed in the gallbladder fossa.  All fluid and air were then evacuated from the abdominal cavity prior to removal of the trocars.  All wounds were irrigated with normal saline.  All wounds were injected with 0.5% Sensorcaine.  The epigastric fascia as well as the supraumbilical fascia were reapproximated using an 0 Vicryl interrupted suture.  All skin incisions were closed using a 4-0 Monocryl subcuticular suture.  Dermabond was applied.  All tape and needle counts were correct at the end of the procedure.  The patient was extubated in the operating room and transferred to PACU in stable condition.  Complications: None  EBL: 50 cc  Specimen: Gallbladder

## 2021-09-06 NOTE — Anesthesia Preprocedure Evaluation (Signed)
Anesthesia Evaluation  Patient identified by MRN, date of birth, ID band Patient awake    Reviewed: Allergy & Precautions, H&P , NPO status , Patient's Chart, lab work & pertinent test results, reviewed documented beta blocker date and time   Airway Mallampati: II  TM Distance: >3 FB Neck ROM: full    Dental no notable dental hx.    Pulmonary neg pulmonary ROS, former smoker,    Pulmonary exam normal breath sounds clear to auscultation       Cardiovascular Exercise Tolerance: Good hypertension, + CAD and + Past MI  + dysrhythmias Atrial Fibrillation  Rhythm:regular Rate:Normal     Neuro/Psych  Headaches,  Neuromuscular disease negative psych ROS   GI/Hepatic negative GI ROS, Neg liver ROS,   Endo/Other  negative endocrine ROSdiabetes  Renal/GU negative Renal ROS  negative genitourinary   Musculoskeletal   Abdominal   Peds  Hematology negative hematology ROS (+)   Anesthesia Other Findings   Reproductive/Obstetrics negative OB ROS                             Anesthesia Physical Anesthesia Plan  ASA: 3  Anesthesia Plan: General and General ETT   Post-op Pain Management:    Induction:   PONV Risk Score and Plan: Ondansetron  Airway Management Planned:   Additional Equipment:   Intra-op Plan:   Post-operative Plan:   Informed Consent: I have reviewed the patients History and Physical, chart, labs and discussed the procedure including the risks, benefits and alternatives for the proposed anesthesia with the patient or authorized representative who has indicated his/her understanding and acceptance.     Dental Advisory Given  Plan Discussed with: CRNA  Anesthesia Plan Comments:         Anesthesia Quick Evaluation

## 2021-09-06 NOTE — Anesthesia Procedure Notes (Signed)
Procedure Name: Intubation Date/Time: 09/06/2021 1:36 PM  Performed by: Minerva Ends, CRNAPre-anesthesia Checklist: Patient identified, Emergency Drugs available, Suction available and Patient being monitored Patient Re-evaluated:Patient Re-evaluated prior to induction Oxygen Delivery Method: Circle system utilized Preoxygenation: Pre-oxygenation with 100% oxygen Induction Type: IV induction Ventilation: Oral airway inserted - appropriate to patient size Laryngoscope Size: Sabra Heck and 2 Grade View: Grade I Tube type: Oral Tube size: 7.5 mm Number of attempts: 1 Airway Equipment and Method: Stylet Placement Confirmation: ETT inserted through vocal cords under direct vision, positive ETCO2 and breath sounds checked- equal and bilateral Secured at: 23 cm Tube secured with: Tape Dental Injury: Teeth and Oropharynx as per pre-operative assessment

## 2021-09-06 NOTE — Anesthesia Postprocedure Evaluation (Signed)
Anesthesia Post Note  Patient: Daniel Short  Procedure(s) Performed: LAPAROSCOPIC CHOLECYSTECTOMY (Abdomen)  Patient location during evaluation: Phase II Anesthesia Type: General Level of consciousness: awake Pain management: pain level controlled Vital Signs Assessment: post-procedure vital signs reviewed and stable Respiratory status: spontaneous breathing and respiratory function stable Cardiovascular status: blood pressure returned to baseline and stable Postop Assessment: no headache and no apparent nausea or vomiting Anesthetic complications: no Comments: Late entry   No notable events documented.   Last Vitals:  Vitals:   09/06/21 1521 09/06/21 1537  BP:    Pulse: 88   Resp: 16   Temp:    SpO2: 100% 94%    Last Pain:  Vitals:   09/06/21 1537  TempSrc:   PainSc: [place]

## 2021-09-06 NOTE — Care Management Obs Status (Signed)
MEDICARE OBSERVATION STATUS NOTIFICATION   Patient Details  Name: Daniel Short MRN: [id number] Date of Birth: [birth year]   Medicare Observation Status Notification Given:  Yes    Iona Beard, [place] 09/06/2021, 9:29 AM

## 2021-09-06 NOTE — Transfer of Care (Signed)
Immediate Anesthesia Transfer of Care Note  Patient: Daniel Short  Procedure(s) Performed: LAPAROSCOPIC CHOLECYSTECTOMY (Abdomen)  Patient Location: PACU  Anesthesia Type:General  Level of Consciousness: awake, alert , oriented and patient cooperative  Airway & Oxygen Therapy: Patient Spontanous Breathing and Patient connected to nasal cannula oxygen  Post-op Assessment: Report given to RN, Post -op Vital signs reviewed and stable and Patient moving all extremities  Post vital signs: Reviewed and stable  Last Vitals:  Vitals Value Taken Time  BP 145/77 09/06/21 1451  Temp    Pulse 97 09/06/21 1453  Resp 21 09/06/21 1453  SpO2 87 % 09/06/21 1453  Vitals shown include unvalidated device data.  Last Pain:  Vitals:   09/06/21 0839  TempSrc: Oral  PainSc: 0-No pain      Patients Stated Pain Goal: 0 (39/79/53 6922)  Complications: No notable events documented.

## 2021-09-06 NOTE — Interval H&P Note (Signed)
History and Physical Interval Note:  09/06/2021 11:22 AM  Daniel Short  has presented today for surgery, with the diagnosis of hyperbilirubenia, gallstones.  The various methods of treatment have been discussed with the patient and family. After consideration of risks, benefits and other options for treatment, the patient has consented to  Procedure(s): LAPAROSCOPIC CHOLECYSTECTOMY (N/A) as a surgical intervention.  The patient's history has been reviewed, patient examined, no change in status, stable for surgery.  I have reviewed the patient's chart and labs.  Questions were answered to the patient's satisfaction.    MRCP without choledocholithiasis and positive for acute cholecystitis, will proceed with cholecystectomy. Discussed with patient and wife.   Virl Cagey

## 2021-09-06 NOTE — Plan of Care (Signed)

## 2021-09-06 NOTE — Progress Notes (Signed)
  Transition of Care [facility]) Screening Note   Patient Details  Name: Daniel Short Date of Birth: 1945-10-26   Transition of Care [facility]) CM/SW Contact:    Iona Beard, [place] Phone Number: 09/06/2021, 11:14 AM    Transition of Care Department [facility]) has reviewed patient and no TOC needs have been identified at this time. We will continue to monitor patient advancement through interdisciplinary progression rounds. If new patient transition needs arise, please place a TOC consult.

## 2021-09-07 ENCOUNTER — Encounter (HOSPITAL_COMMUNITY): Payer: Self-pay | Admitting: General Surgery

## 2021-09-07 LAB — CBC
HCT: 36.1 % — ABNORMAL LOW (ref 39.0–52.0)
Hemoglobin: 11.8 g/dL — ABNORMAL LOW (ref 13.0–17.0)
MCH: 31.1 pg (ref 26.0–34.0)
MCHC: 32.7 g/dL (ref 30.0–36.0)
MCV: 95.3 fL (ref 80.0–100.0)
Platelets: 144 10*3/uL — ABNORMAL LOW (ref 150–400)
RBC: 3.79 MIL/uL — ABNORMAL LOW (ref 4.22–5.81)
RDW: 12.7 % (ref 11.5–15.5)
WBC: 10 10*3/uL (ref 4.0–10.5)
nRBC: 0 % (ref 0.0–0.2)

## 2021-09-07 LAB — COMPREHENSIVE METABOLIC PANEL
ALT: 39 U/L (ref 0–44)
AST: 49 U/L — ABNORMAL HIGH (ref 15–41)
Albumin: 3.1 g/dL — ABNORMAL LOW (ref 3.5–5.0)
Alkaline Phosphatase: 52 U/L (ref 38–126)
Anion gap: 8 (ref 5–15)
BUN: 18 mg/dL (ref 8–23)
CO2: 26 mmol/L (ref 22–32)
Calcium: 8.4 mg/dL — ABNORMAL LOW (ref 8.9–10.3)
Chloride: 104 mmol/L (ref 98–111)
Creatinine, Ser: 1.32 mg/dL — ABNORMAL HIGH (ref 0.61–1.24)
GFR, Estimated: 56 mL/min — ABNORMAL LOW (ref >60)
Glucose, Bld: 149 mg/dL — ABNORMAL HIGH (ref 70–99)
Potassium: 4.4 mmol/L (ref 3.5–5.1)
Sodium: 138 mmol/L (ref 135–145)
Total Bilirubin: 3.6 mg/dL — ABNORMAL HIGH (ref 0.3–1.2)
Total Protein: 6.2 g/dL — ABNORMAL LOW (ref 6.5–8.1)

## 2021-09-07 LAB — BILIRUBIN, DIRECT: Bilirubin, Direct: 0.8 mg/dL — ABNORMAL HIGH (ref 0.0–0.2)

## 2021-09-07 LAB — GLUCOSE, CAPILLARY: Glucose-Capillary: 133 mg/dL — ABNORMAL HIGH (ref 70–99)

## 2021-09-07 NOTE — Progress Notes (Signed)
Ng Discharge Note  Admit Date:  09/04/2021 Discharge date: 09/07/2021   Daniel Short to be D/C'd Home per MD order.  AVS completed. Patient/caregiver able to verbalize understanding.  Discharge Medication: Allergies as of 09/07/2021   No Known Allergies      Medication List     TAKE these medications    allopurinol 100 MG tablet Commonly known as: ZYLOPRIM TAKE 1 TABLET ONCE DAILY FOR GOUT What changed: See the new instructions.   aspirin EC 81 MG tablet Take 1 tablet (81 mg total) by mouth daily. Swallow whole.   atorvastatin 20 MG tablet Commonly known as: LIPITOR TAKE 1 TABLET ONCE DAILY   blood glucose meter kit and supplies Dispense based on patient and insurance preference. Use up to four times daily as directed. (FOR ICD-10 E10.9, E11.9).   HYDROcodone-acetaminophen 5-325 MG tablet Commonly known as: NORCO/VICODIN Take 1 tablet by mouth every 6 (six) hours as needed for moderate pain.   lisinopril 20 MG tablet Commonly known as: ZESTRIL TAKE 1 TABLET DAILY   metFORMIN 500 MG 24 hr tablet Commonly known as: GLUCOPHAGE-XR TAKE 1 TABLET DAILY WITHH BREAKFAST What changed: See the new instructions.   metoprolol tartrate 25 MG tablet Commonly known as: LOPRESSOR TAKE ONE TABLET EVERY MORNING AND ONE-HALF EVERY EVENING What changed:  how much to take how to take this when to take this additional instructions   OneTouch Delica Lancets 27M Misc Use to test blood sugar daily as directed. DX: E11.65   OneTouch Verio Flex System w/Device Kit Use to test blood sugar daily as directed. DX: E11.65   OneTouch Verio test strip Generic drug: glucose blood Use to test blood sugar daily as directed. DX: E11.65   SYSTANE OP Place 1 drop into both eyes 2 (two) times daily as needed (dry eyes).        Discharge Assessment: Vitals:   09/07/21 0158 09/07/21 0448  BP: 117/62 107/65  Pulse: 70 (!) 51  Resp: 17 15  Temp: 98.4 F (36.9 C) 98.5 F (36.9 C)   SpO2: 96% 98%   Skin clean, dry and intact without evidence of skin break down, no evidence of skin tears noted. IV catheter discontinued intact. Site without signs and symptoms of complications - no redness or edema noted at insertion site, patient denies c/o pain - only slight tenderness at site.  Dressing with slight pressure applied.  D/c Instructions-Education: Discharge instructions given to patient/family with verbalized understanding. D/c education completed with patient/family including follow up instructions, medication list, d/c activities limitations if indicated, with other d/c instructions as indicated by MD - patient able to verbalize understanding, all questions fully answered. Patient instructed to return to ED, call 911, or call MD for any changes in condition.  Patient escorted via [place], and D/C home via private auto.  Tsosie Billing, LPN 7/86/7544 92:01 AM

## 2021-09-07 NOTE — Discharge Summary (Signed)
Physician Discharge Summary  Patient ID: Daniel Short MRN: [id number] DOB/AGE: 01-Aug-1945 76 y.o.  Admit date: 09/04/2021 Discharge date: 09/07/2021  Admission Diagnoses: Cholecystitis, cholelithiasis, hyperbilirubinemia  Discharge Diagnoses: Same, hydrops of gallbladder Active Problems:   Hyperlipidemia associated with type 2 diabetes mellitus ([employer])   Hypertension associated with diabetes ([place])   Diabetes mellitus ([place])   Abdominal pain   Cholelithiasis   Obesity (BMI 30-39.9)   Paroxysmal atrial fibrillation ([facility])   Hyperbilirubinemia   Acute hydrops of gallbladder   Discharged Condition: good  Hospital Course: Patient is a 76 year old white male who presented to the emergency room on 09/04/2021 with worsening right upper quadrant abdominal pain.  He was noted on ultrasound of the gallbladder to have a thickened gallbladder wall with stones.  He also had an elevated total bilirubin.  He was started on IV antibiotics.  He is status post an ERCP in the remote past for choledocholithiasis.  An MRCP on 09/06/2021 was negative for choledocholithiasis.  He subsequently underwent laparoscopic cholecystectomy on 09/06/2021.  Hydrops of the gallbladder was found.  He tolerated the surgery well.  His postoperative course has been unremarkable.  His diet was advanced without difficulty.  His total bilirubin is decreasing.  He is being discharged home on 09/07/2021 in good and improving condition.  He will have a follow-up liver enzyme tests as an outpatient.  Treatments: surgery: Laparoscopic cholecystectomy on 09/06/2021  Discharge Exam: Blood pressure 107/65, pulse (!) 51, temperature 98.5 F (36.9 C), temperature source Oral, resp. rate 15, height _0  (1.676 m), weight 100.1 kg, SpO2 98 %. General appearance: alert, cooperative, and no distress Resp: clear to auscultation bilaterally Cardio: regular rate and rhythm, S1, S2 normal, no murmur, click, rub or gallop GI: Soft, incisions healing  well.  Disposition: Discharge disposition: 01-Home or Self Care       Discharge Instructions     Diet - low sodium heart healthy   Complete by: As directed    Increase activity slowly   Complete by: As directed       Allergies as of 09/07/2021   No Known Allergies      Medication List     TAKE these medications    allopurinol 100 MG tablet Commonly known as: ZYLOPRIM TAKE 1 TABLET ONCE DAILY FOR GOUT What changed: See the new instructions.   aspirin EC 81 MG tablet Take 1 tablet (81 mg total) by mouth daily. Swallow whole.   atorvastatin 20 MG tablet Commonly known as: LIPITOR TAKE 1 TABLET ONCE DAILY   blood glucose meter kit and supplies Dispense based on patient and insurance preference. Use up to four times daily as directed. (FOR ICD-10 E10.9, E11.9).   HYDROcodone-acetaminophen 5-325 MG tablet Commonly known as: NORCO/VICODIN Take 1 tablet by mouth every 6 (six) hours as needed for moderate pain.   lisinopril 20 MG tablet Commonly known as: ZESTRIL TAKE 1 TABLET DAILY   metFORMIN 500 MG 24 hr tablet Commonly known as: GLUCOPHAGE-XR TAKE 1 TABLET DAILY WITHH BREAKFAST What changed: See the new instructions.   metoprolol tartrate 25 MG tablet Commonly known as: LOPRESSOR TAKE ONE TABLET EVERY MORNING AND ONE-HALF EVERY EVENING What changed:  how much to take how to take this when to take this additional instructions   OneTouch Delica Lancets 59F Misc Use to test blood sugar daily as directed. DX: E11.65   OneTouch Verio Flex System w/Device Kit Use to test blood sugar daily as directed. DX: E11.65   OneTouch Verio  test strip Generic drug: glucose blood Use to test blood sugar daily as directed. DX: E11.65   SYSTANE OP Place 1 drop into both eyes 2 (two) times daily as needed (dry eyes).        Follow-up Information     Aviva Signs, MD. Schedule an appointment as soon as possible for a visit on 09/14/2021.   Specialty: General  Surgery Contact information: [id number] [id number] [phone]                 Signed: Aviva Signs 09/07/2021, 8:20 AM

## 2021-09-08 LAB — SURGICAL PATHOLOGY

## 2021-09-14 ENCOUNTER — Ambulatory Visit (INDEPENDENT_AMBULATORY_CARE_PROVIDER_SITE_OTHER): Payer: Medicare HMO | Admitting: General Surgery

## 2021-09-14 ENCOUNTER — Encounter: Payer: Self-pay | Admitting: General Surgery

## 2021-09-14 VITALS — BP 157/87 | HR 65 | Temp 98.6°F | Resp 20 | Ht 65.0 in | Wt 223.0 lb

## 2021-09-14 DIAGNOSIS — R7401 Elevation of levels of liver transaminase levels: Secondary | ICD-10-CM | POA: Diagnosis not present

## 2021-09-14 DIAGNOSIS — Z09 Encounter for follow-up examination after completed treatment for conditions other than malignant neoplasm: Secondary | ICD-10-CM

## 2021-09-14 LAB — GLUCOSE, CAPILLARY
Glucose-Capillary: 168 mg/dL — ABNORMAL HIGH (ref 70–99)
Glucose-Capillary: 180 mg/dL — ABNORMAL HIGH (ref 70–99)

## 2021-09-14 NOTE — Progress Notes (Signed)
Subjective:     Daniel Short  Patient here for postoperative visit, status post laparoscopic cholecystectomy for hydrops of the gallbladder.  Patient states that his appetite is slowly returning.  Some foods still do not taste quite right.  He denies any fever, chills, or jaundice.  He denies any nausea or vomiting. Objective:    BP (!) 157/87   Pulse 65   Temp 98.6 F (37 C) (Oral)   Resp 20   Ht '5\' 5"'$  (1.651 m)   Wt 223 lb (101.2 kg)   SpO2 96%   BMI 37.11 kg/m   General:  alert, cooperative, and no distress  Eyes are without scleral icterus Abdomen soft, nontender, nondistended.  All incisions are healing well. Final pathology reviewed with patient.     Assessment:    Doing well postoperatively.    Plan:   We will get follow-up liver enzyme tests, bmet to make sure they are normalizing.  Follow-up here as needed.

## 2021-09-15 LAB — HEPATIC FUNCTION PANEL
ALT: 29 IU/L (ref 0–44)
AST: 23 IU/L (ref 0–40)
Albumin: 3.8 g/dL (ref 3.7–4.7)
Alkaline Phosphatase: 106 IU/L (ref 44–121)
Bilirubin Total: 1.4 mg/dL — ABNORMAL HIGH (ref 0.0–1.2)
Bilirubin, Direct: 0.4 mg/dL (ref 0.00–0.40)
Total Protein: 6 g/dL (ref 6.0–8.5)

## 2021-09-15 LAB — BASIC METABOLIC PANEL
BUN/Creatinine Ratio: 9 — ABNORMAL LOW (ref 10–24)
BUN: 10 mg/dL (ref 8–27)
CO2: 25 mmol/L (ref 20–29)
Calcium: 9.2 mg/dL (ref 8.6–10.2)
Chloride: 99 mmol/L (ref 96–106)
Creatinine, Ser: 1.11 mg/dL (ref 0.76–1.27)
Glucose: 74 mg/dL (ref 70–99)
Potassium: 4.2 mmol/L (ref 3.5–5.2)
Sodium: 139 mmol/L (ref 134–144)
eGFR: 69 mL/min/{1.73_m2} (ref >59)

## 2021-09-27 DIAGNOSIS — G8929 Other chronic pain: Secondary | ICD-10-CM | POA: Diagnosis not present

## 2021-09-27 DIAGNOSIS — E669 Obesity, unspecified: Secondary | ICD-10-CM | POA: Diagnosis not present

## 2021-09-27 DIAGNOSIS — M545 Low back pain, unspecified: Secondary | ICD-10-CM | POA: Diagnosis not present

## 2021-09-27 DIAGNOSIS — E119 Type 2 diabetes mellitus without complications: Secondary | ICD-10-CM | POA: Diagnosis not present

## 2021-09-27 DIAGNOSIS — Z79899 Other long term (current) drug therapy: Secondary | ICD-10-CM | POA: Diagnosis not present

## 2021-09-27 DIAGNOSIS — R030 Elevated blood-pressure reading, without diagnosis of hypertension: Secondary | ICD-10-CM | POA: Diagnosis not present

## 2021-10-05 DIAGNOSIS — Z029 Encounter for administrative examinations, unspecified: Secondary | ICD-10-CM

## 2021-10-11 ENCOUNTER — Encounter: Payer: Self-pay | Admitting: Cardiology

## 2021-10-11 ENCOUNTER — Ambulatory Visit (INDEPENDENT_AMBULATORY_CARE_PROVIDER_SITE_OTHER): Payer: Medicare HMO | Admitting: Cardiology

## 2021-10-11 VITALS — BP 122/72 | HR 55 | Ht 67.0 in | Wt 210.0 lb

## 2021-10-11 DIAGNOSIS — I480 Paroxysmal atrial fibrillation: Secondary | ICD-10-CM

## 2021-10-11 DIAGNOSIS — I25119 Atherosclerotic heart disease of native coronary artery with unspecified angina pectoris: Secondary | ICD-10-CM

## 2021-10-11 DIAGNOSIS — E782 Mixed hyperlipidemia: Secondary | ICD-10-CM

## 2021-10-11 NOTE — Patient Instructions (Signed)

## 2021-10-11 NOTE — Progress Notes (Signed)
Cardiology Office Note  Date: 10/11/2021   ID: Daniel Short, DOB Jun 22, 1945, MRN [id number]  PCP:  Sharion Balloon, FNP  Cardiologist:  Rozann Lesches, MD Electrophysiologist:  None   Chief Complaint  Patient presents with   Cardiac follow-up    History of Present Illness: Daniel Short is a 76 y.o. male last seen in August 2022.  He is here today with his son for a follow-up visit.  He recently underwent laparoscopic cholecystectomy with Dr. Arnoldo Morale, doing well at this point and without any obvious perioperative cardiac events.  He does not report any active angina symptoms on current medications.  I personally reviewed his ECG from June as noted below.  Cardiac regimen as outlined below.  He does not report any obvious intolerances.  Most recent LDL was 61 on Lipitor.  Past Medical History:  Diagnosis Date   Arthritis    Basal cell carcinoma 1997   left ear tx with biopsy   CAD (coronary artery disease)    Status post proximal/ostial RCA Zeta stents 2003   Chronic back pain    Ear infection 1979   Right ear due to wax build up   Headache    History of stroke 2000   Intracerebral hemorrhage   Myocardial infarction [facility]) 2003   Paroxysmal atrial fibrillation ([place])    Type 2 diabetes mellitus ([place])     Past Surgical History:  Procedure Laterality Date   BILIARY STENT PLACEMENT N/A 02/06/2020   Procedure: BILIARY STENT PLACEMENT;  Surgeon: Rogene Houston, MD;  Location: [facility];  Service: Endoscopy;  Laterality: N/A;   CARPAL TUNNEL RELEASE Left    CHOLECYSTECTOMY N/A 09/06/2021   Procedure: LAPAROSCOPIC CHOLECYSTECTOMY;  Surgeon: Virl Cagey, MD;  Location: [facility];  Service: General;  Laterality: N/A;   CORONARY STENT PLACEMENT  2005   ERCP N/A 02/06/2020   Procedure: ENDOSCOPIC RETROGRADE CHOLANGIOPANCREATOGRAPHY (ERCP) with stone extraction;  Surgeon: Rogene Houston, MD;  Location: [facility];  Service: Endoscopy;  Laterality: N/A;  1:15   ERCP N/A  05/27/2020   Procedure: ENDOSCOPIC RETROGRADE CHOLANGIOPANCREATOGRAPHY (ERCP) WITH PROPOFOL;  Surgeon: Rogene Houston, MD;  Location: [facility];  Service: Endoscopy;  Laterality: N/A;   GASTROINTESTINAL STENT REMOVAL N/A 05/27/2020   Procedure: STENT REMOVAL;  Surgeon: Rogene Houston, MD;  Location: [facility];  Service: Endoscopy;  Laterality: N/A;   HERNIA REPAIR     ventral   KNEE ARTHROSCOPY Right    LUMBAR LAMINECTOMY/DECOMPRESSION MICRODISCECTOMY N/A 09/14/2016   Procedure: Decompression L3-5, insitu fusion L4-5;  Surgeon: Melina Schools, MD;  Location: [facility];  Service: Orthopedics;  Laterality: N/A;  3 hrs   LUMBAR LAMINECTOMY/DECOMPRESSION MICRODISCECTOMY N/A 09/17/2016   Procedure: WOUND EXPLORATION, GILL DECOMPRESSION,FAR LATERAL DISCECTOMY LEFT L4-L5, AND EVACUATION OF HEMATOMA;  Surgeon: Melina Schools, MD;  Location: [facility];  Service: Orthopedics;  Laterality: N/A;   SPHINCTEROTOMY N/A 02/06/2020   Procedure: SPHINCTEROTOMY;  Surgeon: Rogene Houston, MD;  Location: [facility];  Service: Endoscopy;  Laterality: N/A;    Current Outpatient Medications  Medication Sig Dispense Refill   allopurinol (ZYLOPRIM) 100 MG tablet TAKE 1 TABLET ONCE DAILY FOR GOUT (Patient taking differently: Take 100 mg by mouth daily.) 90 tablet 0   aspirin EC 81 MG tablet Take 1 tablet (81 mg total) by mouth daily. Swallow whole. 90 tablet 3   atorvastatin (LIPITOR) 20 MG tablet TAKE 1 TABLET ONCE DAILY (Patient taking differently: Take 20 mg by mouth daily.) 90  tablet 1   blood glucose meter kit and supplies Dispense based on patient and insurance preference. Use up to four times daily as directed. (FOR ICD-10 E10.9, E11.9). 1 each 0   Blood Glucose Monitoring Suppl ([place]) w/Device KIT Use to test blood sugar daily as directed. DX: E11.65 1 kit 1   glucose blood (ONETOUCH VERIO) test strip Use to test blood sugar daily as directed. DX: E11.65 100 each 12   HYDROcodone-acetaminophen  (NORCO/VICODIN) 5-325 MG tablet Take 1 tablet by mouth every 6 (six) hours as needed for moderate pain.      lisinopril (ZESTRIL) 20 MG tablet TAKE 1 TABLET DAILY (Patient taking differently: Take 20 mg by mouth daily.) 90 tablet 0   metFORMIN (GLUCOPHAGE-XR) 500 MG 24 hr tablet TAKE 1 TABLET DAILY WITHH BREAKFAST (Patient taking differently: Take 500 mg by mouth daily with breakfast.) 90 tablet 0   metoprolol tartrate (LOPRESSOR) 25 MG tablet TAKE ONE TABLET EVERY MORNING AND ONE-HALF EVERY EVENING (Patient taking differently: Take 37.5 mg by mouth daily.) 135 tablet 1   OneTouch Delica Lancets 00F MISC Use to test blood sugar daily as directed. DX: E11.65 100 each 11   Polyethyl Glycol-Propyl Glycol (SYSTANE OP) Place 1 drop into both eyes 2 (two) times daily as needed (dry eyes).     No current facility-administered medications for this visit.   Allergies:  Patient has no known allergies.   ROS: No palpitations or syncope.  Physical Exam: VS:  BP 122/72 (BP Location: Left Arm, Patient Position: Sitting, Cuff Size: Large)   Pulse (!) 55   Ht 5' 7"  (1.702 m)   Wt 210 lb (95.3 kg)   SpO2 98%   BMI 32.89 kg/m , BMI Body mass index is 32.89 kg/m.  Wt Readings from Last 3 Encounters:  10/11/21 210 lb (95.3 kg)  09/14/21 223 lb (101.2 kg)  09/04/21 220 lb 10.9 oz (100.1 kg)    General: Patient appears comfortable at rest. HEENT: Conjunctiva and lids normal, oropharynx clear. Neck: Supple, no elevated JVP or carotid bruits, no thyromegaly. Lungs: Clear to auscultation, nonlabored breathing at rest. Cardiac: Regular rate and rhythm, no S3, 1/6 systolic murmur. Extremities: No pitting edema.  ECG:  An ECG dated 09/04/2021 was personally reviewed today and demonstrated:  Sinus rhythm with left anterior fascicular block and nonspecific T wave changes.  Recent Labwork: 09/07/2021: Hemoglobin 11.8; Platelets 144 09/14/2021: ALT 29; AST 23; BUN 10; Creatinine, Ser 1.11; Potassium 4.2; Sodium  139     Component Value Date/Time   CHOL 118 02/11/2021 1412   TRIG 132 02/11/2021 1412   HDL 33 (L) 02/11/2021 1412   CHOLHDL 3.6 02/11/2021 1412   CHOLHDL 3.8 05/28/2020 0423   VLDL 24 05/28/2020 0423   LDLCALC 61 02/11/2021 1412   LDLDIRECT 82 05/19/2016 1616    Other Studies Reviewed Today:  Echocardiogram 05/28/2020:  1. Left ventricular ejection fraction, by estimation, is 50%. The left  ventricle has low normal function. Left ventricular endocardial border not  optimally defined to evaluate regional wall motion. There is mild left  ventricular hypertrophy. Left  ventricular diastolic parameters are indeterminate.   2. Right ventricular systolic function is normal. The right ventricular  size is normal.   3. The mitral valve is normal in structure. No evidence of mitral valve  regurgitation. No evidence of mitral stenosis.   4. The aortic valve was not well visualized. Aortic valve regurgitation  is not visualized. No aortic stenosis is present.  5. The inferior vena cava is normal in size with greater than 50%  respiratory variability, suggesting right atrial pressure of 3 mmHg.    Cardiac monitor April 2022: ZIO XT reviewed.  13 days 12 hours analyzed.  Predominant rhythm is sinus with heart rate ranging from 49 bpm up to 110 bpm and average heart rate 63 bpm.  There were occasional PACs representing 4.7% total beats, otherwise rare couplets and triplets representing less than 1% total beats.  There were rare PVCs including couplets and triplets representing less than 1% total beats with limited ventricular bigeminy.  7 episodes of NSVT were noted, the longest of which was 13 beats.  There were multiple episodes of PSVT, the longest of which lasted for approximately 18 seconds at a heart rate of 120.  Limited atrial fibrillation also observed, 10% rhythm burden.  There were no pauses.  Assessment and Plan:  1.  CAD status post BMS to the ostial and proximal RCA in 2003.  He  does not report any angina symptoms and we have followed him on medical therapy.  ECG reviewed and stable.  Continue aspirin, Lipitor, lisinopril, and Lopressor.  2.  Hyperlipidemia, doing well on Lipitor with recent LDL 61.  3.  Paroxysmal atrial fibrillation with CHA2DS2-VASc score of 5.  He is not anticoagulated with history of prior spontaneous subarachnoid hemorrhage and hemorrhagic stroke.  He did not want to proceed Watchman device evaluation and continues on aspirin along with Lopressor.  No obvious recent arrhythmia.  Medication Adjustments/Labs and Tests Ordered: Current medicines are reviewed at length with the patient today.  Concerns regarding medicines are outlined above.   Tests Ordered: No orders of the defined types were placed in this encounter.   Medication Changes: No orders of the defined types were placed in this encounter.   Disposition:  Follow up  6 months.  Signed, Satira Sark, MD, [facility] 10/11/2021 3:40 PM    [facility] [place], [place], [place] [address] Phone: [phone]; Fax: [phone]

## 2021-11-06 ENCOUNTER — Other Ambulatory Visit: Payer: Self-pay | Admitting: Family

## 2021-11-06 DIAGNOSIS — E118 Type 2 diabetes mellitus with unspecified complications: Secondary | ICD-10-CM

## 2021-11-09 ENCOUNTER — Other Ambulatory Visit: Payer: Self-pay | Admitting: Family

## 2021-11-09 DIAGNOSIS — M109 Gout, unspecified: Secondary | ICD-10-CM

## 2021-11-09 DIAGNOSIS — I152 Hypertension secondary to endocrine disorders: Secondary | ICD-10-CM

## 2021-12-07 ENCOUNTER — Other Ambulatory Visit: Payer: Self-pay | Admitting: Family

## 2021-12-07 ENCOUNTER — Ambulatory Visit (INDEPENDENT_AMBULATORY_CARE_PROVIDER_SITE_OTHER): Payer: Medicare HMO | Admitting: Family

## 2021-12-07 ENCOUNTER — Encounter: Payer: Self-pay | Admitting: Family

## 2021-12-07 VITALS — BP 104/63 | HR 95 | Temp 96.7°F | Ht 67.0 in | Wt 204.0 lb

## 2021-12-07 DIAGNOSIS — Z9889 Other specified postprocedural states: Secondary | ICD-10-CM

## 2021-12-07 DIAGNOSIS — M545 Low back pain, unspecified: Secondary | ICD-10-CM | POA: Diagnosis not present

## 2021-12-07 DIAGNOSIS — I152 Hypertension secondary to endocrine disorders: Secondary | ICD-10-CM

## 2021-12-07 DIAGNOSIS — I4891 Unspecified atrial fibrillation: Secondary | ICD-10-CM

## 2021-12-07 DIAGNOSIS — I2510 Atherosclerotic heart disease of native coronary artery without angina pectoris: Secondary | ICD-10-CM | POA: Diagnosis not present

## 2021-12-07 DIAGNOSIS — E785 Hyperlipidemia, unspecified: Secondary | ICD-10-CM | POA: Diagnosis not present

## 2021-12-07 DIAGNOSIS — E1169 Type 2 diabetes mellitus with other specified complication: Secondary | ICD-10-CM

## 2021-12-07 DIAGNOSIS — Z0001 Encounter for general adult medical examination with abnormal findings: Secondary | ICD-10-CM

## 2021-12-07 DIAGNOSIS — E1159 Type 2 diabetes mellitus with other circulatory complications: Secondary | ICD-10-CM | POA: Diagnosis not present

## 2021-12-07 DIAGNOSIS — E118 Type 2 diabetes mellitus with unspecified complications: Secondary | ICD-10-CM

## 2021-12-07 DIAGNOSIS — E669 Obesity, unspecified: Secondary | ICD-10-CM | POA: Diagnosis not present

## 2021-12-07 DIAGNOSIS — Z79899 Other long term (current) drug therapy: Secondary | ICD-10-CM

## 2021-12-07 DIAGNOSIS — Z0000 Encounter for general adult medical examination without abnormal findings: Secondary | ICD-10-CM | POA: Diagnosis not present

## 2021-12-07 DIAGNOSIS — M109 Gout, unspecified: Secondary | ICD-10-CM

## 2021-12-07 DIAGNOSIS — G8929 Other chronic pain: Secondary | ICD-10-CM

## 2021-12-07 LAB — BAYER DCA HB A1C WAIVED: HB A1C (BAYER DCA - WAIVED): 5.9 % — ABNORMAL HIGH (ref 4.8–5.6)

## 2021-12-07 MED ORDER — HYDROCODONE-ACETAMINOPHEN 5-325 MG PO TABS: 1.0000 | ORAL_TABLET | Freq: Four times a day (QID) | ORAL | 0 refills | Status: DC | PRN

## 2021-12-07 NOTE — Patient Instructions (Signed)
Vertigo Vertigo is the feeling that you or your surroundings are moving when they are not. This feeling can come and go at any time. Vertigo often goes away on its own. Vertigo can be dangerous if it occurs while you are doing something that could endanger yourself or others, such as driving or operating machinery. Your health care provider will do tests to try to determine the cause of your vertigo. Tests will also help your health care provider decide how best to treat your condition. Follow these instructions at home: Eating and drinking     Dehydration can make vertigo worse. Drink enough fluid to keep your urine pale yellow. Do not drink alcohol. Activity Return to your normal activities as told by your health care provider. Ask your health care provider what activities are safe for you. In the morning, first sit up on the side of the bed. When you feel okay, stand slowly while you hold onto something until you know that your balance is fine. Move slowly. Avoid sudden body or head movements or certain positions, as told by your health care provider. If you have trouble walking or keeping your balance, try using a cane for stability. If you feel dizzy or unstable, sit down right away. Avoid doing any tasks that would cause danger to you or others if vertigo occurs. Avoid bending down if you feel dizzy. Place items in your home so that they are easy for you to reach without bending or leaning over. Do not drive or use machinery if you feel dizzy. General instructions Take over-the-counter and prescription medicines only as told by your health care provider. Keep all follow-up visits. This is important. Contact a health care provider if: Your medicines do not relieve your vertigo or they make it worse. Your condition gets worse or you develop new symptoms. You have a fever. You develop nausea or vomiting, or if nausea gets worse. Your family or friends notice any behavioral changes. You  have numbness or a prickling and tingling sensation in part of your body. Get help right away if you: Are always dizzy or you faint. Develop severe headaches. Develop a stiff neck. Develop sensitivity to light. Have difficulty moving or speaking. Have weakness in your hands, arms, or legs. Have changes in your hearing or vision. These symptoms may represent a serious problem that is an emergency. Do not wait to see if the symptoms will go away. Get medical help right away. Call your local emergency services (911 in the [place] Do not drive yourself to the hospital. Summary Vertigo is the feeling that you or your surroundings are moving when they are not. Your health care provider will do tests to try to determine the cause of your vertigo. Follow instructions for home care. You may be told to avoid certain tasks, positions, or movements. Contact a health care provider if your medicines do not relieve your symptoms, or if you have a fever, nausea, vomiting, or changes in behavior. Get help right away if you have severe headaches or difficulty speaking, or you develop hearing or vision problems. This information is not intended to replace advice given to you by your health care provider. Make sure you discuss any questions you have with your health care provider. Document Revised: 02/12/2020 Document Reviewed: 02/12/2020 Elsevier Patient Education  2023 [employer]  

## 2021-12-07 NOTE — Addendum Note (Signed)
Addended by: Thana Ates on: 12/07/2021 10:56 AM   Modules accepted: Orders

## 2021-12-07 NOTE — Progress Notes (Signed)
Subjective:    Patient ID: Daniel Short, male    DOB: 09/12/1945, 76 y.o.   MRN: [id number]  PT presents to the office today for CPE and chronic follow up. Pt is followed by Cardiologists every 3 months for CAD, A Fib, and hx of MI.    He is followed by GI for hyperbilirubinemia.   Hypertension This is a chronic problem. The current episode started more than 1 year ago. The problem has been resolved since onset. The problem is controlled. Associated symptoms include malaise/fatigue. Pertinent negatives include no blurred vision, peripheral edema or shortness of breath. Risk factors for coronary artery disease include obesity, male gender, dyslipidemia and diabetes mellitus. Past treatments include ACE inhibitors. The current treatment provides moderate improvement.  Diabetes He presents for his follow-up diabetic visit. He has type 2 diabetes mellitus. Hypoglycemia symptoms include dizziness. Associated symptoms include fatigue and foot paresthesias. Pertinent negatives for diabetes include no blurred vision. Symptoms are stable. Diabetic complications include heart disease and peripheral neuropathy. Risk factors for coronary artery disease include dyslipidemia, diabetes mellitus, male sex, hypertension and sedentary lifestyle. He is following a generally unhealthy diet. (Does not check BS at home)  Dizziness This is a new problem. The current episode started 1 to 4 weeks ago. The problem occurs intermittently. The problem has been unchanged. Associated symptoms include fatigue. The symptoms are aggravated by twisting. Treatments tried: rest. The treatment provided mild relief.  Hyperlipidemia Pertinent negatives include no shortness of breath.  Back Pain This is a chronic problem. The current episode started more than 1 year ago. The problem occurs intermittently. The problem has been waxing and waning since onset. The pain is present in the lumbar spine. The quality of the pain is described  as aching. The pain is at a severity of 6/10. The pain is moderate. The symptoms are aggravated by twisting and bending. He has tried analgesics for the symptoms. The treatment provided mild relief.    Current opioids rx- Norco 5-325 mg # meds rx- 60 Effectiveness of current meds-stable Adverse reactions from pain meds-None Morphine equivalent- 20  Pill count performed-No Last drug screen - 12/07/21 ( high risk q24m moderate risk q697mlow risk yearly ) Urine drug screen today- Yes Was the [place]- Yes  If yes were their any concerning findings? - Pt was getting from Pain clinic, will start prescribing  Pain contract signed on: 12/07/21     Review of Systems  Constitutional:  Positive for fatigue and malaise/fatigue.  Eyes:  Negative for blurred vision.  Respiratory:  Negative for shortness of breath.   Musculoskeletal:  Positive for back pain.  Neurological:  Positive for dizziness.  All other systems reviewed and are negative.  Family History  Problem Relation Age of Onset   Heart attack Mother    Heart attack Father    COPD Father    Heart attack Brother    Colon cancer Neg Hx   . Social History   Socioeconomic History   Marital status: Married    Spouse name: Not on file   Number of children: Not on file   Years of education: Not on file   Highest education level: Not on file  Occupational History   Not on file  Tobacco Use   Smoking status: Former    Packs/day: 0.25    Years: 54.00    Total pack years: 13.50    Types: Cigarettes    Quit date: 08/27/2015    Years since  quitting: 6.2   Smokeless tobacco: Never  Vaping Use   Vaping Use: Never used  Substance and Sexual Activity   Alcohol use: No   Drug use: No   Sexual activity: Yes  Other Topics Concern   Not on file  Social History Narrative   Not on file   Social Determinants of Health   Financial Resource Strain: Not on file  Food Insecurity: Not on file  Transportation Needs: Not on file   Physical Activity: Not on file  Stress: Not on file  Social Connections: Not on file      Objective:   Physical Exam Vitals reviewed.  Constitutional:      General: He is not in acute distress.    Appearance: He is well-developed. He is obese.  HENT:     Head: Normocephalic.     Right Ear: Tympanic membrane normal.     Left Ear: Tympanic membrane normal.  Eyes:     General:        Right eye: No discharge.        Left eye: No discharge.     Pupils: Pupils are equal, round, and reactive to light.  Neck:     Thyroid: No thyromegaly.  Cardiovascular:     Rate and Rhythm: Normal rate and regular rhythm.     Heart sounds: Normal heart sounds. No murmur heard. Pulmonary:     Effort: Pulmonary effort is normal. No respiratory distress.     Breath sounds: Normal breath sounds. No wheezing.  Abdominal:     General: Bowel sounds are normal. There is no distension.     Palpations: Abdomen is soft.     Tenderness: There is no abdominal tenderness.  Musculoskeletal:        General: Tenderness present.     Cervical back: Normal range of motion and neck supple.     Comments: Pain in lumbar with flexion and extension  Skin:    General: Skin is warm and dry.     Findings: No erythema or rash.  Neurological:     Mental Status: He is alert and oriented to person, place, and time.     Cranial Nerves: No cranial nerve deficit.     Motor: Weakness (using cane) present.     Deep Tendon Reflexes: Reflexes are normal and symmetric.  Psychiatric:        Behavior: Behavior normal.        Thought Content: Thought content normal.        Judgment: Judgment normal.     Diabetic Foot Exam - Simple   Simple Foot Form Visual Inspection No deformities, no ulcerations, no other skin breakdown bilaterally: Yes Sensation Testing Intact to touch and monofilament testing bilaterally: Yes Pulse Check Posterior Tibialis and Dorsalis pulse intact bilaterally: Yes Comments      BP 104/63    Pulse 95   Temp (!) 96.7 F (35.9 C) (Temporal)   Ht 5' 7"  (1.702 m)   Wt 204 lb (92.5 kg)   SpO2 98%   BMI 31.95 kg/m      Assessment & Plan:  Daniel Short comes in today with chief complaint of Hypertension, Diabetes, and Dizziness   Diagnosis and orders addressed:  1. Hypertension associated with diabetes ([facility]) - CMP14+EGFR - CBC with Differential/Platelet  2. Atrial fibrillation with RVR (HCC) - CMP14+EGFR - CBC with Differential/Platelet  3. Coronary artery disease involving native coronary artery of native heart without angina pectoris - CMP14+EGFR - CBC with  Differential/Platelet  4. Type 2 diabetes mellitus with other specified complication, without long-term current use of insulin (HCC) - Bayer DCA Hb A1c Waived - CMP14+EGFR - CBC with Differential/Platelet - Microalbumin / creatinine urine ratio  5. Hyperlipidemia associated with type 2 diabetes mellitus (HCC) - CMP14+EGFR - CBC with Differential/Platelet - Lipid panel  6. Obesity (BMI 30-39.9) - CMP14+EGFR - CBC with Differential/Platelet  7. Status post lumbar surgery - CMP14+EGFR - CBC with Differential/Platelet  8. Chronic low back pain, unspecified back pain laterality, unspecified whether sciatica present - HYDROcodone-acetaminophen (NORCO/VICODIN) 5-325 MG tablet; Take 1 tablet by mouth every 6 (six) hours as needed for moderate pain.  Dispense: 60 tablet; Refill: 0 - CMP14+EGFR - CBC with Differential/Platelet - HYDROcodone-acetaminophen (NORCO) 5-325 MG tablet; Take 1 tablet by mouth every 6 (six) hours as needed for moderate pain.  Dispense: 60 tablet; Refill: 0 - HYDROcodone-acetaminophen (NORCO) 5-325 MG tablet; Take 1 tablet by mouth every 6 (six) hours as needed for moderate pain.  Dispense: 60 tablet; Refill: 0 - ToxASSURE Select 13 (MW), Urine  9. Controlled substance agreement signed - HYDROcodone-acetaminophen (NORCO/VICODIN) 5-325 MG tablet; Take 1 tablet by mouth every 6 (six)  hours as needed for moderate pain.  Dispense: 60 tablet; Refill: 0 - CMP14+EGFR - CBC with Differential/Platelet - ToxASSURE Select 13 (MW), Urine  10. Annual physical exam - Bayer DCA Hb A1c Waived - CMP14+EGFR - CBC with Differential/Platelet - Microalbumin / creatinine urine ratio - Lipid panel - TSH   Labs pending Patient reviewed in [place] controlled database, no flags noted. Contract and drug screen are up to date.  Health Maintenance reviewed Diet and exercise encouraged  Follow up plan: 3 months    Evelina Dun, FNP

## 2021-12-08 LAB — CBC WITH DIFFERENTIAL/PLATELET
Basophils Absolute: 0.1 10*3/uL (ref 0.0–0.2)
Basos: 1 %
EOS (ABSOLUTE): 0.1 10*3/uL (ref 0.0–0.4)
Eos: 1 %
Hematocrit: 42.2 % (ref 37.5–51.0)
Hemoglobin: 13.7 g/dL (ref 13.0–17.7)
Immature Grans (Abs): 0 10*3/uL (ref 0.0–0.1)
Immature Granulocytes: 0 %
Lymphocytes Absolute: 2.8 10*3/uL (ref 0.7–3.1)
Lymphs: 29 %
MCH: 28.8 pg (ref 26.6–33.0)
MCHC: 32.5 g/dL (ref 31.5–35.7)
MCV: 89 fL (ref 79–97)
Monocytes Absolute: 0.6 10*3/uL (ref 0.1–0.9)
Monocytes: 6 %
Neutrophils Absolute: 5.9 10*3/uL (ref 1.4–7.0)
Neutrophils: 63 %
Platelets: 335 10*3/uL (ref 150–450)
RBC: 4.75 x10E6/uL (ref 4.14–5.80)
RDW: 12.4 % (ref 11.6–15.4)
WBC: 9.5 10*3/uL (ref 3.4–10.8)

## 2021-12-08 LAB — CMP14+EGFR
ALT: 28 IU/L (ref 0–44)
AST: 28 IU/L (ref 0–40)
Albumin/Globulin Ratio: 1.6 (ref 1.2–2.2)
Albumin: 4.3 g/dL (ref 3.8–4.8)
Alkaline Phosphatase: 114 IU/L (ref 44–121)
BUN/Creatinine Ratio: 11 (ref 10–24)
BUN: 15 mg/dL (ref 8–27)
Bilirubin Total: 0.7 mg/dL (ref 0.0–1.2)
CO2: 22 mmol/L (ref 20–29)
Calcium: 10.1 mg/dL (ref 8.6–10.2)
Chloride: 101 mmol/L (ref 96–106)
Creatinine, Ser: 1.31 mg/dL — ABNORMAL HIGH (ref 0.76–1.27)
Globulin, Total: 2.7 g/dL (ref 1.5–4.5)
Glucose: 176 mg/dL — ABNORMAL HIGH (ref 70–99)
Potassium: 4.5 mmol/L (ref 3.5–5.2)
Sodium: 140 mmol/L (ref 134–144)
Total Protein: 7 g/dL (ref 6.0–8.5)
eGFR: 56 mL/min/{1.73_m2} — ABNORMAL LOW (ref >59)

## 2021-12-08 LAB — LIPID PANEL
Chol/HDL Ratio: 3.2 ratio (ref 0.0–5.0)
Cholesterol, Total: 96 mg/dL — ABNORMAL LOW (ref 100–199)
HDL: 30 mg/dL — ABNORMAL LOW (ref >39)
LDL Chol Calc (NIH): 46 mg/dL (ref 0–99)
Triglycerides: 107 mg/dL (ref 0–149)
VLDL Cholesterol Cal: 20 mg/dL (ref 5–40)

## 2021-12-08 LAB — MICROALBUMIN / CREATININE URINE RATIO
Creatinine, Urine: 327 mg/dL
Microalb/Creat Ratio: 109 mg/g creat — ABNORMAL HIGH (ref 0–29)
Microalbumin, Urine: 356.1 ug/mL

## 2021-12-08 LAB — TSH: TSH: 2.44 u[IU]/mL (ref 0.450–4.500)

## 2021-12-09 ENCOUNTER — Other Ambulatory Visit: Payer: Self-pay | Admitting: Cardiology

## 2021-12-10 ENCOUNTER — Other Ambulatory Visit: Payer: Self-pay | Admitting: Family Medicine

## 2021-12-10 DIAGNOSIS — I152 Hypertension secondary to endocrine disorders: Secondary | ICD-10-CM

## 2021-12-10 DIAGNOSIS — E118 Type 2 diabetes mellitus with unspecified complications: Secondary | ICD-10-CM

## 2021-12-10 MED ORDER — METFORMIN HCL ER 500 MG PO TB24: ORAL_TABLET | ORAL | 2 refills | Status: DC

## 2021-12-10 MED ORDER — LISINOPRIL 20 MG PO TABS: 20.0000 mg | ORAL_TABLET | Freq: Every day | ORAL | 2 refills | Status: DC

## 2021-12-22 LAB — DRUG SCREEN 10 W/CONF, SERUM
Amphetamines, IA: NEGATIVE ng/mL
Barbiturates, IA: NEGATIVE ug/mL
Benzodiazepines, IA: NEGATIVE ng/mL
Cocaine & Metabolite, IA: NEGATIVE ng/mL
Methadone, IA: NEGATIVE ng/mL
Opiates, IA: NEGATIVE ng/mL
Oxycodones, IA: NEGATIVE ng/mL
Phencyclidine, IA: NEGATIVE ng/mL
Propoxyphene, IA: NEGATIVE ng/mL
THC(Marijuana) Metabolite, IA: NEGATIVE ng/mL

## 2021-12-22 LAB — AMPHETAMINES/MDMA,MS,WB/SP RFX
Amphetamine: NEGATIVE ng/mL
Amphetamines Confirmation: NEGATIVE
MDA: NEGATIVE ng/mL
MDEA: NEGATIVE ng/mL
MDMA: NEGATIVE ng/mL
Methamphetamine: NEGATIVE ng/mL

## 2021-12-22 LAB — OXYCODONES,MS,WB/SP RFX
Oxycocone: NEGATIVE ng/mL
Oxycodones Confirmation: NEGATIVE
Oxymorphone: NEGATIVE ng/mL

## 2022-01-10 ENCOUNTER — Other Ambulatory Visit: Payer: Self-pay | Admitting: Family

## 2022-02-03 DIAGNOSIS — H02052 Trichiasis without entropian right lower eyelid: Secondary | ICD-10-CM | POA: Diagnosis not present

## 2022-02-03 DIAGNOSIS — Z7984 Long term (current) use of oral hypoglycemic drugs: Secondary | ICD-10-CM | POA: Diagnosis not present

## 2022-02-03 DIAGNOSIS — H02002 Unspecified entropion of right lower eyelid: Secondary | ICD-10-CM | POA: Diagnosis not present

## 2022-02-03 DIAGNOSIS — H2513 Age-related nuclear cataract, bilateral: Secondary | ICD-10-CM | POA: Diagnosis not present

## 2022-02-03 DIAGNOSIS — E119 Type 2 diabetes mellitus without complications: Secondary | ICD-10-CM | POA: Diagnosis not present

## 2022-02-03 LAB — HM DIABETES EYE EXAM

## 2022-04-13 NOTE — Progress Notes (Signed)
Cardiology Office Note  Date: 04/14/2022   ID: Daniel Short, DOB 11/25/1945, MRN [id number]  PCP:  Sharion Balloon, FNP  Cardiologist:  Rozann Lesches, MD Electrophysiologist:  None   Chief Complaint  Patient presents with   Cardiac follow-up    History of Present Illness: Daniel Short is a 77 y.o. male last seen in July 2023.  He is here today with his wife for a follow-up visit.  He does not report any angina since last encounter.  Limited by chronic back pain and arthritic symptoms.  He is using a cane today.  I reviewed his cardiac medications which are stable and outlined below.  He continues to follow lab work regularly with PCP.  Most recent LDL was 46 on Lipitor.  He does not report any sense of palpitations.  He has had no dizziness or syncope.  Recent mechanical fall described.  Past Medical History:  Diagnosis Date   Arthritis    Basal cell carcinoma 1997   left ear tx with biopsy   CAD (coronary artery disease)    Status post proximal/ostial RCA Zeta stents 2003   Chronic back pain    Ear infection 1979   Right ear due to wax build up   Headache    History of stroke 2000   Intracerebral hemorrhage   Myocardial infarction [facility]) 2003   Paroxysmal atrial fibrillation (HCC)    Type 2 diabetes mellitus (HCC)     Current Outpatient Medications  Medication Sig Dispense Refill   allopurinol (ZYLOPRIM) 100 MG tablet TAKE 1 TABLET ONCE DAILY FOR GOUT 30 tablet 4   aspirin EC 81 MG tablet Take 1 tablet (81 mg total) by mouth daily. Swallow whole. 90 tablet 3   atorvastatin (LIPITOR) 20 MG tablet TAKE 1 TABLET ONCE DAILY 90 tablet 1   blood glucose meter kit and supplies Dispense based on patient and insurance preference. Use up to four times daily as directed. (FOR ICD-10 E10.9, E11.9). 1 each 0   Blood Glucose Monitoring Suppl ([place]) w/Device KIT Use to test blood sugar daily as directed. DX: E11.65 1 kit 1   glucose blood (ONETOUCH  VERIO) test strip Use to test blood sugar daily as directed. DX: E11.65 100 each 12   HYDROcodone-acetaminophen (NORCO) 5-325 MG tablet Take 1 tablet by mouth every 6 (six) hours as needed for moderate pain. 60 tablet 0   HYDROcodone-acetaminophen (NORCO) 5-325 MG tablet Take 1 tablet by mouth every 6 (six) hours as needed for moderate pain. 60 tablet 0   HYDROcodone-acetaminophen (NORCO/VICODIN) 5-325 MG tablet Take 1 tablet by mouth every 6 (six) hours as needed for moderate pain. 60 tablet 0   lisinopril (ZESTRIL) 20 MG tablet Take 1 tablet (20 mg total) by mouth daily. 90 tablet 2   metFORMIN (GLUCOPHAGE-XR) 500 MG 24 hr tablet TAKE ONE TABLET ONCE DAILY WITH BREAKFAST 90 tablet 2   metoprolol tartrate (LOPRESSOR) 25 MG tablet TAKE ONE TABLET EVERY MORNING AND ONE-HALF EVERY EVENING 135 tablet 1   OneTouch Delica Lancets 38V MISC Use to test blood sugar daily as directed. DX: E11.65 100 each 11   Polyethyl Glycol-Propyl Glycol (SYSTANE OP) Place 1 drop into both eyes 2 (two) times daily as needed (dry eyes).     No current facility-administered medications for this visit.   Allergies:  Patient has no known allergies.   ROS: No orthopnea or PND.  Physical Exam: VS:  BP 138/76   Pulse  75   Ht '5\' 7"'$  (1.702 m)   Wt 212 lb 3.2 oz (96.3 kg)   SpO2 98%   BMI 33.24 kg/m , BMI Body mass index is 33.24 kg/m.  Wt Readings from Last 3 Encounters:  04/14/22 212 lb 3.2 oz (96.3 kg)  12/07/21 204 lb (92.5 kg)  10/11/21 210 lb (95.3 kg)    General: Patient appears comfortable at rest. HEENT: Conjunctiva and lids normal. Neck: Supple, no elevated JVP or carotid bruits. Lungs: Clear to auscultation, nonlabored breathing at rest. Cardiac: Regular rate and rhythm, no S3, 1/6 systolic murmur. Extremities: No pitting edema.  ECG:  An ECG dated 09/04/2021 was personally reviewed today and demonstrated:  Sinus rhythm with left anterior fascicular block and nonspecific T wave changes.  Recent  Labwork: 12/07/2021: ALT 28; AST 28; BUN 15; Creatinine, Ser 1.31; Hemoglobin 13.7; Platelets 335; Potassium 4.5; Sodium 140; TSH 2.440     Component Value Date/Time   CHOL 96 (L) 12/07/2021 1509   TRIG 107 12/07/2021 1509   HDL 30 (L) 12/07/2021 1509   CHOLHDL 3.2 12/07/2021 1509   CHOLHDL 3.8 05/28/2020 0423   VLDL 24 05/28/2020 0423   LDLCALC 46 12/07/2021 1509   LDLDIRECT 82 05/19/2016 1616    Other Studies Reviewed Today:  Echocardiogram 05/28/2020:  1. Left ventricular ejection fraction, by estimation, is 50%. The left  ventricle has low normal function. Left ventricular endocardial border not  optimally defined to evaluate regional wall motion. There is mild left  ventricular hypertrophy. Left  ventricular diastolic parameters are indeterminate.   2. Right ventricular systolic function is normal. The right ventricular  size is normal.   3. The mitral valve is normal in structure. No evidence of mitral valve  regurgitation. No evidence of mitral stenosis.   4. The aortic valve was not well visualized. Aortic valve regurgitation  is not visualized. No aortic stenosis is present.   5. The inferior vena cava is normal in size with greater than 50%  respiratory variability, suggesting right atrial pressure of 3 mmHg.    Cardiac monitor April 2022: ZIO XT reviewed.  13 days 12 hours analyzed.  Predominant rhythm is sinus with heart rate ranging from 49 bpm up to 110 bpm and average heart rate 63 bpm.  There were occasional PACs representing 4.7% total beats, otherwise rare couplets and triplets representing less than 1% total beats.  There were rare PVCs including couplets and triplets representing less than 1% total beats with limited ventricular bigeminy.  7 episodes of NSVT were noted, the longest of which was 13 beats.  There were multiple episodes of PSVT, the longest of which lasted for approximately 18 seconds at a heart rate of 120.  Limited atrial fibrillation also observed,  10% rhythm burden.  There were no pauses.  Assessment and Plan:  1.  CAD status post BMS to the ox deal and proximal RCA in 2003.  We have continued with observation in the absence of angina on medical therapy.  LVEF approximately 50% by last assessment.  Plan to continue aspirin, Lipitor, lisinopril, and Lopressor.  2.  Paroxysmal atrial fibrillation with CHA2DS2-VASc score of 5.  He is not anticoagulated with history of previous spontaneous subarachnoid hemorrhage and hemorrhagic stroke.  Also declined Watchman device.  No reported increase in palpitations.  3.  Hyperlipidemia, LDL 46 on Lipitor.  No changes were made today.  Medication Adjustments/Labs and Tests Ordered: Current medicines are reviewed at length with the patient today.  Concerns regarding  medicines are outlined above.   Tests Ordered: No orders of the defined types were placed in this encounter.   Medication Changes: No orders of the defined types were placed in this encounter.   Disposition:  Follow up  6 months.  Signed, Satira Sark, MD, [facility] 04/14/2022 11:29 AM    [place] [facility] [place], [place], [place] [address] Phone: [phone]; Fax: [phone]

## 2022-04-14 ENCOUNTER — Encounter: Payer: Self-pay | Admitting: Cardiology

## 2022-04-14 ENCOUNTER — Ambulatory Visit: Payer: Medicare HMO | Attending: Cardiology | Admitting: Cardiology

## 2022-04-14 VITALS — BP 138/76 | HR 75 | Ht 67.0 in | Wt 212.2 lb

## 2022-04-14 DIAGNOSIS — I480 Paroxysmal atrial fibrillation: Secondary | ICD-10-CM

## 2022-04-14 DIAGNOSIS — E782 Mixed hyperlipidemia: Secondary | ICD-10-CM

## 2022-04-14 DIAGNOSIS — I25119 Atherosclerotic heart disease of native coronary artery with unspecified angina pectoris: Secondary | ICD-10-CM

## 2022-04-14 NOTE — Patient Instructions (Addendum)

## 2022-04-21 ENCOUNTER — Ambulatory Visit (INDEPENDENT_AMBULATORY_CARE_PROVIDER_SITE_OTHER): Payer: Medicare HMO | Admitting: Family

## 2022-04-21 ENCOUNTER — Encounter: Payer: Self-pay | Admitting: Family

## 2022-04-21 VITALS — BP 111/66 | HR 70 | Temp 98.3°F | Ht 67.0 in | Wt 215.0 lb

## 2022-04-21 DIAGNOSIS — I4891 Unspecified atrial fibrillation: Secondary | ICD-10-CM | POA: Diagnosis not present

## 2022-04-21 DIAGNOSIS — G8929 Other chronic pain: Secondary | ICD-10-CM | POA: Diagnosis not present

## 2022-04-21 DIAGNOSIS — I480 Paroxysmal atrial fibrillation: Secondary | ICD-10-CM

## 2022-04-21 DIAGNOSIS — E118 Type 2 diabetes mellitus with unspecified complications: Secondary | ICD-10-CM | POA: Diagnosis not present

## 2022-04-21 DIAGNOSIS — E669 Obesity, unspecified: Secondary | ICD-10-CM | POA: Diagnosis not present

## 2022-04-21 DIAGNOSIS — I152 Hypertension secondary to endocrine disorders: Secondary | ICD-10-CM | POA: Diagnosis not present

## 2022-04-21 DIAGNOSIS — E785 Hyperlipidemia, unspecified: Secondary | ICD-10-CM

## 2022-04-21 DIAGNOSIS — M109 Gout, unspecified: Secondary | ICD-10-CM | POA: Diagnosis not present

## 2022-04-21 DIAGNOSIS — M545 Low back pain, unspecified: Secondary | ICD-10-CM | POA: Diagnosis not present

## 2022-04-21 DIAGNOSIS — E1169 Type 2 diabetes mellitus with other specified complication: Secondary | ICD-10-CM | POA: Diagnosis not present

## 2022-04-21 DIAGNOSIS — E1159 Type 2 diabetes mellitus with other circulatory complications: Secondary | ICD-10-CM

## 2022-04-21 DIAGNOSIS — Z79899 Other long term (current) drug therapy: Secondary | ICD-10-CM | POA: Diagnosis not present

## 2022-04-21 DIAGNOSIS — I2510 Atherosclerotic heart disease of native coronary artery without angina pectoris: Secondary | ICD-10-CM | POA: Diagnosis not present

## 2022-04-21 MED ORDER — METOPROLOL TARTRATE 25 MG PO TABS: ORAL_TABLET | ORAL | 1 refills | Status: DC

## 2022-04-21 MED ORDER — METFORMIN HCL ER 500 MG PO TB24: ORAL_TABLET | ORAL | 2 refills | Status: DC

## 2022-04-21 MED ORDER — HYDROCODONE-ACETAMINOPHEN 5-325 MG PO TABS: 1.0000 | ORAL_TABLET | Freq: Four times a day (QID) | ORAL | 0 refills | Status: DC | PRN

## 2022-04-21 MED ORDER — LISINOPRIL 20 MG PO TABS: 20.0000 mg | ORAL_TABLET | Freq: Every day | ORAL | 2 refills | Status: DC

## 2022-04-21 MED ORDER — ALLOPURINOL 100 MG PO TABS: ORAL_TABLET | ORAL | 3 refills | Status: DC

## 2022-04-21 MED ORDER — ATORVASTATIN CALCIUM 20 MG PO TABS: 20.0000 mg | ORAL_TABLET | Freq: Every day | ORAL | 1 refills | Status: DC

## 2022-04-21 NOTE — Patient Instructions (Signed)
Health Maintenance After Age 78 After age 78, you are at a higher risk for certain long-term diseases and infections as well as injuries from falls. Falls are a major cause of broken bones and head injuries in people who are older than age 78. Getting regular preventive care can help to keep you healthy and well. Preventive care includes getting regular testing and making lifestyle changes as recommended by your health care provider. Talk with your health care provider about: Which screenings and tests you should have. A screening is a test that checks for a disease when you have no symptoms. A diet and exercise plan that is right for you. What should I know about screenings and tests to prevent falls? Screening and testing are the best ways to find a health problem early. Early diagnosis and treatment give you the best chance of managing medical conditions that are common after age 78. Certain conditions and lifestyle choices may make you more likely to have a fall. Your health care provider may recommend: Regular vision checks. Poor vision and conditions such as cataracts can make you more likely to have a fall. If you wear glasses, make sure to get your prescription updated if your vision changes. Medicine review. Work with your health care provider to regularly review all of the medicines you are taking, including over-the-counter medicines. Ask your health care provider about any side effects that may make you more likely to have a fall. Tell your health care provider if any medicines that you take make you feel dizzy or sleepy. Strength and balance checks. Your health care provider may recommend certain tests to check your strength and balance while standing, walking, or changing positions. Foot health exam. Foot pain and numbness, as well as not wearing proper footwear, can make you more likely to have a fall. Screenings, including: Osteoporosis screening. Osteoporosis is a condition that causes  the bones to get weaker and break more easily. Blood pressure screening. Blood pressure changes and medicines to control blood pressure can make you feel dizzy. Depression screening. You may be more likely to have a fall if you have a fear of falling, feel depressed, or feel unable to do activities that you used to do. Alcohol use screening. Using too much alcohol can affect your balance and may make you more likely to have a fall. Follow these instructions at home: Lifestyle Do not drink alcohol if: Your health care provider tells you not to drink. If you drink alcohol: Limit how much you have to: 0-1 drink a day for women. 0-2 drinks a day for men. Know how much alcohol is in your drink. In the [place] one drink equals one 12 oz bottle of beer (355 mL), one 5 oz glass of wine (148 mL), or one 1 oz glass of hard liquor (44 mL). Do not use any products that contain nicotine or tobacco. These products include cigarettes, chewing tobacco, and vaping devices, such as e-cigarettes. If you need help quitting, ask your health care provider. Activity  Follow a regular exercise program to stay fit. This will help you maintain your balance. Ask your health care provider what types of exercise are appropriate for you. If you need a cane or walker, use it as recommended by your health care provider. Wear supportive shoes that have nonskid soles. Safety  Remove any tripping hazards, such as rugs, cords, and clutter. Install safety equipment such as grab bars in bathrooms and safety rails on stairs. Keep rooms and walkways   well-lit. General instructions Talk with your health care provider about your risks for falling. Tell your health care provider if: You fall. Be sure to tell your health care provider about all falls, even ones that seem minor. You feel dizzy, tiredness (fatigue), or off-balance. Take over-the-counter and prescription medicines only as told by your health care provider. These include  supplements. Eat a healthy diet and maintain a healthy weight. A healthy diet includes low-fat dairy products, low-fat (lean) meats, and fiber from whole grains, beans, and lots of fruits and vegetables. Stay current with your vaccines. Schedule regular health, dental, and eye exams. Summary Having a healthy lifestyle and getting preventive care can help to protect your health and wellness after age 78. Screening and testing are the best way to find a health problem early and help you avoid having a fall. Early diagnosis and treatment give you the best chance for managing medical conditions that are more common for people who are older than age 78. Falls are a major cause of broken bones and head injuries in people who are older than age 78. Take precautions to prevent a fall at home. Work with your health care provider to learn what changes you can make to improve your health and wellness and to prevent falls. This information is not intended to replace advice given to you by your health care provider. Make sure you discuss any questions you have with your health care provider. Document Revised: 08/03/2020 Document Reviewed: 08/03/2020 Elsevier Patient Education  2023 [employer]  

## 2022-04-21 NOTE — Progress Notes (Signed)
Subjective:    Patient ID: Daniel Short, male    DOB: 07/13/1945, 77 y.o.   MRN: [id number]  Chief Complaint  Patient presents with   Medical Management of Chronic Issues   PT presents to the office today for chronic follow up. Pt is followed by Cardiologists every 3 months for CAD, A Fib, and hx of MI.  Hypertension This is a chronic problem. The current episode started more than 1 year ago. The problem has been resolved since onset. The problem is controlled. Associated symptoms include shortness of breath ("once in awhile"). Pertinent negatives include no blurred vision, malaise/fatigue or peripheral edema. Risk factors for coronary artery disease include dyslipidemia, male gender and sedentary lifestyle. The current treatment provides moderate improvement.  Hyperlipidemia This is a chronic problem. The current episode started more than 1 year ago. The problem is controlled. Recent lipid tests were reviewed and are normal. Exacerbating diseases include obesity. Associated symptoms include shortness of breath ("once in awhile"). Current antihyperlipidemic treatment includes statins. The current treatment provides moderate improvement of lipids. Risk factors for coronary artery disease include dyslipidemia, hypertension, male sex and a sedentary lifestyle.  Diabetes He presents for his follow-up diabetic visit. Associated symptoms include foot paresthesias. Pertinent negatives for diabetes include no blurred vision. Symptoms are stable. Diabetic complications include peripheral neuropathy. Risk factors for coronary artery disease include dyslipidemia, diabetes mellitus, hypertension, male sex and sedentary lifestyle. He is following a generally healthy diet. (Does not check BS at home ) An ACE inhibitor/angiotensin II receptor blocker is being taken.  Back Pain This is a chronic problem. The current episode started more than 1 year ago. The problem occurs intermittently. The problem has been  waxing and waning since onset. The pain is present in the lumbar spine. The quality of the pain is described as aching. The pain is at a severity of 8/10. The pain is moderate. Risk factors include obesity. The treatment provided moderate relief.    Current opioids rx- Norco 5-325 mg # meds rx- 60 Effectiveness of current meds-stable Adverse reactions from pain meds-None Morphine equivalent- 20   Pill count performed-No Last drug screen - 12/07/21 ( high risk q78m moderate risk q665mlow risk yearly ) Urine drug screen today- Yes [place]             If yes were their any concerning findings? - Pt was getting from Pain clinic, will start prescribing   Pain contract signed on: 12/07/21  Review of Systems  Constitutional:  Negative for malaise/fatigue.  Eyes:  Negative for blurred vision.  Respiratory:  Positive for shortness of breath ("once in awhile").   Musculoskeletal:  Positive for back pain.  All other systems reviewed and are negative.      Objective:   Physical Exam Vitals reviewed.  Constitutional:      General: He is not in acute distress.    Appearance: He is well-developed. He is obese.  HENT:     Head: Normocephalic.     Right Ear: Tympanic membrane normal.     Left Ear: Tympanic membrane normal.  Eyes:     General:        Right eye: No discharge.        Left eye: No discharge.     Pupils: Pupils are equal, round, and reactive to light.  Neck:     Thyroid: No thyromegaly.  Cardiovascular:     Rate and Rhythm: Normal rate and regular rhythm.  Heart sounds: Normal heart sounds. No murmur heard. Pulmonary:     Effort: Pulmonary effort is normal. No respiratory distress.     Breath sounds: Normal breath sounds. No wheezing.  Abdominal:     General: Bowel sounds are normal. There is no distension.     Palpations: Abdomen is soft.     Tenderness: There is no abdominal tenderness.  Musculoskeletal:        General: No tenderness. Normal  range of motion.     Cervical back: Normal range of motion and neck supple.     Comments: Pain in lumbar with flexion  Skin:    General: Skin is warm and dry.     Findings: No erythema or rash.  Neurological:     Mental Status: He is alert and oriented to person, place, and time.     Cranial Nerves: No cranial nerve deficit.     Deep Tendon Reflexes: Reflexes are normal and symmetric.  Psychiatric:        Behavior: Behavior normal.        Thought Content: Thought content normal.        Judgment: Judgment normal.       BP 111/66   Pulse 70   Temp 98.3 F (36.8 C) (Temporal)   Ht '5\' 7"'$  (1.702 m)   Wt 215 lb (97.5 kg)   SpO2 96%   BMI 33.67 kg/m      Assessment & Plan:  Daniel Short comes in today with chief complaint of Medical Management of Chronic Issues   Diagnosis and orders addressed:  1. Acute gout involving toe of right foot, unspecified cause - allopurinol (ZYLOPRIM) 100 MG tablet; TAKE 1 TABLET ONCE DAILY FOR GOUT  Dispense: 90 tablet; Refill: 3 - CMP14+EGFR - CBC with Differential/Platelet  2. Chronic low back pain, unspecified back pain laterality, unspecified whether sciatica present - HYDROcodone-acetaminophen (NORCO) 5-325 MG tablet; Take 1 tablet by mouth every 6 (six) hours as needed for moderate pain.  Dispense: 60 tablet; Refill: 0 - HYDROcodone-acetaminophen (NORCO) 5-325 MG tablet; Take 1 tablet by mouth every 6 (six) hours as needed for moderate pain.  Dispense: 60 tablet; Refill: 0 - HYDROcodone-acetaminophen (NORCO/VICODIN) 5-325 MG tablet; Take 1 tablet by mouth every 6 (six) hours as needed for moderate pain.  Dispense: 60 tablet; Refill: 0 - CMP14+EGFR - CBC with Differential/Platelet  3. Controlled substance agreement signed - HYDROcodone-acetaminophen (NORCO) 5-325 MG tablet; Take 1 tablet by mouth every 6 (six) hours as needed for moderate pain.  Dispense: 60 tablet; Refill: 0 - HYDROcodone-acetaminophen (NORCO) 5-325 MG tablet; Take 1  tablet by mouth every 6 (six) hours as needed for moderate pain.  Dispense: 60 tablet; Refill: 0 - HYDROcodone-acetaminophen (NORCO/VICODIN) 5-325 MG tablet; Take 1 tablet by mouth every 6 (six) hours as needed for moderate pain.  Dispense: 60 tablet; Refill: 0 - CMP14+EGFR - CBC with Differential/Platelet  4. Hypertension associated with diabetes ([place]) - lisinopril (ZESTRIL) 20 MG tablet; Take 1 tablet (20 mg total) by mouth daily.  Dispense: 90 tablet; Refill: 2 - metoprolol tartrate (LOPRESSOR) 25 MG tablet; TAKE ONE TABLET EVERY MORNING AND ONE-HALF EVERY EVENING  Dispense: 135 tablet; Refill: 1 - CMP14+EGFR - CBC with Differential/Platelet  5. Type 2 diabetes mellitus with complication, without long-term current use of insulin (HCC) - metFORMIN (GLUCOPHAGE-XR) 500 MG 24 hr tablet; TAKE ONE TABLET ONCE DAILY WITH BREAKFAST  Dispense: 90 tablet; Refill: 2 - CMP14+EGFR - CBC with Differential/Platelet  6. Atrial fibrillation with  RVR ([place]) - CMP14+EGFR - CBC with Differential/Platelet  7. Coronary artery disease involving native coronary artery of native heart without angina pectoris - CMP14+EGFR - CBC with Differential/Platelet  8. Type 2 diabetes mellitus with other specified complication, without long-term current use of insulin (HCC) - CMP14+EGFR - CBC with Differential/Platelet  9. Hyperlipidemia associated with type 2 diabetes mellitus (HCC) - atorvastatin (LIPITOR) 20 MG tablet; Take 1 tablet (20 mg total) by mouth daily.  Dispense: 90 tablet; Refill: 1 - CMP14+EGFR - CBC with Differential/Platelet  10. Obesity (BMI 30-39.9) - CMP14+EGFR - CBC with Differential/Platelet  11. Paroxysmal atrial fibrillation (HCC) - CMP14+EGFR - CBC with Differential/Platelet   Labs pending Health Maintenance reviewed Diet and exercise encouraged  Follow up plan: 3 months   Evelina Dun, FNP

## 2022-04-22 LAB — CMP14+EGFR
ALT: 37 IU/L (ref 0–44)
AST: 34 IU/L (ref 0–40)
Albumin/Globulin Ratio: 1.9 (ref 1.2–2.2)
Albumin: 4.2 g/dL (ref 3.8–4.8)
Alkaline Phosphatase: 66 IU/L (ref 44–121)
BUN/Creatinine Ratio: 13 (ref 10–24)
BUN: 14 mg/dL (ref 8–27)
Bilirubin Total: 1.9 mg/dL — ABNORMAL HIGH (ref 0.0–1.2)
CO2: 22 mmol/L (ref 20–29)
Calcium: 9.4 mg/dL (ref 8.6–10.2)
Chloride: 105 mmol/L (ref 96–106)
Creatinine, Ser: 1.11 mg/dL (ref 0.76–1.27)
Globulin, Total: 2.2 g/dL (ref 1.5–4.5)
Glucose: 81 mg/dL (ref 70–99)
Potassium: 4.9 mmol/L (ref 3.5–5.2)
Sodium: 140 mmol/L (ref 134–144)
Total Protein: 6.4 g/dL (ref 6.0–8.5)
eGFR: 68 mL/min/{1.73_m2} (ref >59)

## 2022-04-22 LAB — CBC WITH DIFFERENTIAL/PLATELET
Basophils Absolute: 0.1 10*3/uL (ref 0.0–0.2)
Basos: 1 %
EOS (ABSOLUTE): 0.1 10*3/uL (ref 0.0–0.4)
Eos: 1 %
Hematocrit: 41.2 % (ref 37.5–51.0)
Hemoglobin: 14 g/dL (ref 13.0–17.7)
Immature Grans (Abs): 0 10*3/uL (ref 0.0–0.1)
Immature Granulocytes: 0 %
Lymphocytes Absolute: 3 10*3/uL (ref 0.7–3.1)
Lymphs: 34 %
MCH: 30.2 pg (ref 26.6–33.0)
MCHC: 34 g/dL (ref 31.5–35.7)
MCV: 89 fL (ref 79–97)
Monocytes Absolute: 1.2 10*3/uL — ABNORMAL HIGH (ref 0.1–0.9)
Monocytes: 13 %
Neutrophils Absolute: 4.5 10*3/uL (ref 1.4–7.0)
Neutrophils: 51 %
Platelets: 195 10*3/uL (ref 150–450)
RBC: 4.64 x10E6/uL (ref 4.14–5.80)
RDW: 13.8 % (ref 11.6–15.4)
WBC: 8.8 10*3/uL (ref 3.4–10.8)

## 2022-08-15 NOTE — Telephone Encounter (Signed)
Erroneous encounter will close.

## 2022-10-04 ENCOUNTER — Encounter: Payer: Self-pay | Admitting: Family

## 2022-10-04 ENCOUNTER — Ambulatory Visit: Payer: Medicare HMO | Admitting: Family

## 2022-10-04 VITALS — BP 134/73 | HR 61 | Temp 97.3°F | Ht 67.0 in | Wt 217.0 lb

## 2022-10-04 DIAGNOSIS — E1169 Type 2 diabetes mellitus with other specified complication: Secondary | ICD-10-CM

## 2022-10-04 DIAGNOSIS — M545 Low back pain, unspecified: Secondary | ICD-10-CM

## 2022-10-04 DIAGNOSIS — I152 Hypertension secondary to endocrine disorders: Secondary | ICD-10-CM | POA: Diagnosis not present

## 2022-10-04 DIAGNOSIS — E1159 Type 2 diabetes mellitus with other circulatory complications: Secondary | ICD-10-CM | POA: Diagnosis not present

## 2022-10-04 DIAGNOSIS — E669 Obesity, unspecified: Secondary | ICD-10-CM

## 2022-10-04 DIAGNOSIS — E785 Hyperlipidemia, unspecified: Secondary | ICD-10-CM

## 2022-10-04 DIAGNOSIS — I2510 Atherosclerotic heart disease of native coronary artery without angina pectoris: Secondary | ICD-10-CM

## 2022-10-04 DIAGNOSIS — Z23 Encounter for immunization: Secondary | ICD-10-CM

## 2022-10-04 DIAGNOSIS — G8929 Other chronic pain: Secondary | ICD-10-CM

## 2022-10-04 DIAGNOSIS — Z79899 Other long term (current) drug therapy: Secondary | ICD-10-CM | POA: Diagnosis not present

## 2022-10-04 DIAGNOSIS — M109 Gout, unspecified: Secondary | ICD-10-CM

## 2022-10-04 LAB — CMP14+EGFR
ALT: 24 IU/L (ref 0–44)
AST: 26 IU/L (ref 0–40)
Albumin: 4.1 g/dL (ref 3.8–4.8)
Alkaline Phosphatase: 69 IU/L (ref 44–121)
BUN/Creatinine Ratio: 9 — ABNORMAL LOW (ref 10–24)
BUN: 10 mg/dL (ref 8–27)
Bilirubin Total: 1.5 mg/dL — ABNORMAL HIGH (ref 0.0–1.2)
CO2: 21 mmol/L (ref 20–29)
Calcium: 9.6 mg/dL (ref 8.6–10.2)
Chloride: 105 mmol/L (ref 96–106)
Creatinine, Ser: 1.14 mg/dL (ref 0.76–1.27)
Globulin, Total: 2.5 g/dL (ref 1.5–4.5)
Glucose: 161 mg/dL — ABNORMAL HIGH (ref 70–99)
Potassium: 4.4 mmol/L (ref 3.5–5.2)
Sodium: 140 mmol/L (ref 134–144)
Total Protein: 6.6 g/dL (ref 6.0–8.5)
eGFR: 66 mL/min/{1.73_m2} (ref >59)

## 2022-10-04 LAB — BAYER DCA HB A1C WAIVED: HB A1C (BAYER DCA - WAIVED): 6.4 % — ABNORMAL HIGH (ref 4.8–5.6)

## 2022-10-04 MED ORDER — HYDROCODONE-ACETAMINOPHEN 5-325 MG PO TABS: 1.0000 | ORAL_TABLET | Freq: Four times a day (QID) | ORAL | 0 refills | Status: DC | PRN

## 2022-10-04 NOTE — Progress Notes (Signed)
Subjective:    Patient ID: Daniel Short, male    DOB: 03-18-1946, 77 y.o.   MRN: [id number]  Chief Complaint  Patient presents with   Medical Management of Chronic Issues    Not fasting    PT presents to the office today for chronic follow up. Pt is followed by Cardiologists every 6 months for CAD, A Fib, and hx of MI.   He has hx of gout and takes allopurinol 100 mg daily. Does not remember last flare up.  Hypertension This is a chronic problem. The current episode started more than 1 year ago. The problem has been resolved since onset. The problem is controlled. Associated symptoms include blurred vision. Pertinent negatives include no malaise/fatigue, peripheral edema or shortness of breath. Risk factors for coronary artery disease include obesity, male gender and dyslipidemia.  Hyperlipidemia This is a chronic problem. The current episode started more than 1 year ago. The problem is controlled. Recent lipid tests were reviewed and are normal. Exacerbating diseases include obesity. Pertinent negatives include no shortness of breath. Current antihyperlipidemic treatment includes statins. The current treatment provides moderate improvement of lipids. Risk factors for coronary artery disease include dyslipidemia, hypertension, male sex and a sedentary lifestyle.  Diabetes He presents for his follow-up diabetic visit. He has type 2 diabetes mellitus. Associated symptoms include blurred vision. Pertinent negatives for diabetes include no foot paresthesias. Symptoms are stable. Risk factors for coronary artery disease include dyslipidemia, diabetes mellitus, hypertension, male sex and sedentary lifestyle. (Does not check glucose at home) Eye exam is current.  Back Pain This is a chronic problem. The current episode started more than 1 year ago. The problem occurs intermittently. The problem has been waxing and waning since onset. The pain is present in the lumbar spine. The quality of the pain  is described as aching. The pain is at a severity of 7/10. The pain is moderate. Risk factors include obesity. He has tried analgesics for the symptoms. The treatment provided mild relief.   Current opioids rx- Norco 5-325 mg # meds rx- 60 Effectiveness of current meds-stable Adverse reactions from pain meds-None Morphine equivalent- 20   Pill count performed-No Last drug screen - 12/07/21 ( high risk q40m, moderate risk q63m, low risk yearly ) Urine drug screen today- Yes Was the [place]- Yes             If yes were their any concerning findings? - Pt was getting from Pain clinic, will start prescribing   Pain contract signed on: 12/07/21    Review of Systems  Constitutional:  Negative for malaise/fatigue.  Eyes:  Positive for blurred vision.  Respiratory:  Negative for shortness of breath.   Musculoskeletal:  Positive for back pain.  All other systems reviewed and are negative.      Objective:   Physical Exam Vitals reviewed.  Constitutional:      General: He is not in acute distress.    Appearance: He is well-developed. He is obese.  HENT:     Head: Normocephalic.     Right Ear: Tympanic membrane normal.     Left Ear: Tympanic membrane normal.  Eyes:     General:        Right eye: No discharge.        Left eye: No discharge.     Pupils: Pupils are equal, round, and reactive to light.  Neck:     Thyroid: No thyromegaly.  Cardiovascular:     Rate and Rhythm:  Normal rate and regular rhythm.     Heart sounds: Normal heart sounds. No murmur heard. Pulmonary:     Effort: Pulmonary effort is normal. No respiratory distress.     Breath sounds: Normal breath sounds. No wheezing.  Abdominal:     General: Bowel sounds are normal. There is no distension.     Palpations: Abdomen is soft.     Tenderness: There is no abdominal tenderness.  Musculoskeletal:        General: No tenderness. Normal range of motion.     Cervical back: Normal range of motion and neck supple.   Skin:    General: Skin is warm and dry.     Findings: No erythema or rash.  Neurological:     Mental Status: He is alert and oriented to person, place, and time.     Cranial Nerves: No cranial nerve deficit.     Deep Tendon Reflexes: Reflexes are normal and symmetric.  Psychiatric:        Behavior: Behavior normal.        Thought Content: Thought content normal.        Judgment: Judgment normal.       BP 134/73   Pulse 61   Temp (!) 97.3 F (36.3 C) (Temporal)   Ht 5\' 7"  (1.702 m)   Wt 217 lb (98.4 kg)   BMI 33.99 kg/m      Assessment & Plan:   Daniel Short comes in today with chief complaint of Medical Management of Chronic Issues (Not fasting )   Diagnosis and orders addressed:  1. Controlled substance agreement signed - HYDROcodone-acetaminophen (NORCO) 5-325 MG tablet; Take 1 tablet by mouth every 6 (six) hours as needed for moderate pain.  Dispense: 60 tablet; Refill: 0 - HYDROcodone-acetaminophen (NORCO) 5-325 MG tablet; Take 1 tablet by mouth every 6 (six) hours as needed for moderate pain.  Dispense: 60 tablet; Refill: 0 - HYDROcodone-acetaminophen (NORCO/VICODIN) 5-325 MG tablet; Take 1 tablet by mouth every 6 (six) hours as needed for moderate pain.  Dispense: 60 tablet; Refill: 0 - CMP14+EGFR  2. Chronic low back pain, unspecified back pain laterality, unspecified whether sciatica present - HYDROcodone-acetaminophen (NORCO) 5-325 MG tablet; Take 1 tablet by mouth every 6 (six) hours as needed for moderate pain.  Dispense: 60 tablet; Refill: 0 - HYDROcodone-acetaminophen (NORCO) 5-325 MG tablet; Take 1 tablet by mouth every 6 (six) hours as needed for moderate pain.  Dispense: 60 tablet; Refill: 0 - HYDROcodone-acetaminophen (NORCO/VICODIN) 5-325 MG tablet; Take 1 tablet by mouth every 6 (six) hours as needed for moderate pain.  Dispense: 60 tablet; Refill: 0 - CMP14+EGFR  3. Coronary artery disease involving native coronary artery of native heart without  angina pectoris - CMP14+EGFR  4. Type 2 diabetes mellitus with other specified complication, without long-term current use of insulin (HCC) - CMP14+EGFR - Bayer DCA Hb A1c Waived  5. Hyperlipidemia associated with type 2 diabetes mellitus (HCC) - CMP14+EGFR  6. Hypertension associated with diabetes (HCC) - CMP14+EGFR  7. Obesity (BMI 30-39.9) - CMP14+EGFR  8. Acute gout involving toe of right foot, unspecified cause - CMP14+EGFR   Labs pending Patient reviewed in [place] controlled database, no flags noted. Contract and drug screen are up to date.  Health Maintenance reviewed Diet and exercise encouraged  Follow up plan: 3 months    Jannifer Rodney, FNP

## 2022-10-04 NOTE — Patient Instructions (Signed)
Health Maintenance After Age 78 After age 78, you are at a higher risk for certain long-term diseases and infections as well as injuries from falls. Falls are a major cause of broken bones and head injuries in people who are older than age 78. Getting regular preventive care can help to keep you healthy and well. Preventive care includes getting regular testing and making lifestyle changes as recommended by your health care provider. Talk with your health care provider about: Which screenings and tests you should have. A screening is a test that checks for a disease when you have no symptoms. A diet and exercise plan that is right for you. What should I know about screenings and tests to prevent falls? Screening and testing are the best ways to find a health problem early. Early diagnosis and treatment give you the best chance of managing medical conditions that are common after age 78. Certain conditions and lifestyle choices may make you more likely to have a fall. Your health care provider may recommend: Regular vision checks. Poor vision and conditions such as cataracts can make you more likely to have a fall. If you wear glasses, make sure to get your prescription updated if your vision changes. Medicine review. Work with your health care provider to regularly review all of the medicines you are taking, including over-the-counter medicines. Ask your health care provider about any side effects that may make you more likely to have a fall. Tell your health care provider if any medicines that you take make you feel dizzy or sleepy. Strength and balance checks. Your health care provider may recommend certain tests to check your strength and balance while standing, walking, or changing positions. Foot health exam. Foot pain and numbness, as well as not wearing proper footwear, can make you more likely to have a fall. Screenings, including: Osteoporosis screening. Osteoporosis is a condition that causes  the bones to get weaker and break more easily. Blood pressure screening. Blood pressure changes and medicines to control blood pressure can make you feel dizzy. Depression screening. You may be more likely to have a fall if you have a fear of falling, feel depressed, or feel unable to do activities that you used to do. Alcohol use screening. Using too much alcohol can affect your balance and may make you more likely to have a fall. Follow these instructions at home: Lifestyle Do not drink alcohol if: Your health care provider tells you not to drink. If you drink alcohol: Limit how much you have to: 0-1 drink a day for women. 0-2 drinks a day for men. Know how much alcohol is in your drink. In the [place] one drink equals one 12 oz bottle of beer (355 mL), one 5 oz glass of wine (148 mL), or one 1 oz glass of hard liquor (44 mL). Do not use any products that contain nicotine or tobacco. These products include cigarettes, chewing tobacco, and vaping devices, such as e-cigarettes. If you need help quitting, ask your health care provider. Activity  Follow a regular exercise program to stay fit. This will help you maintain your balance. Ask your health care provider what types of exercise are appropriate for you. If you need a cane or walker, use it as recommended by your health care provider. Wear supportive shoes that have nonskid soles. Safety  Remove any tripping hazards, such as rugs, cords, and clutter. Install safety equipment such as grab bars in bathrooms and safety rails on stairs. Keep rooms and walkways   well-lit. General instructions Talk with your health care provider about your risks for falling. Tell your health care provider if: You fall. Be sure to tell your health care provider about all falls, even ones that seem minor. You feel dizzy, tiredness (fatigue), or off-balance. Take over-the-counter and prescription medicines only as told by your health care provider. These include  supplements. Eat a healthy diet and maintain a healthy weight. A healthy diet includes low-fat dairy products, low-fat (lean) meats, and fiber from whole grains, beans, and lots of fruits and vegetables. Stay current with your vaccines. Schedule regular health, dental, and eye exams. Summary Having a healthy lifestyle and getting preventive care can help to protect your health and wellness after age 78. Screening and testing are the best way to find a health problem early and help you avoid having a fall. Early diagnosis and treatment give you the best chance for managing medical conditions that are more common for people who are older than age 78. Falls are a major cause of broken bones and head injuries in people who are older than age 78. Take precautions to prevent a fall at home. Work with your health care provider to learn what changes you can make to improve your health and wellness and to prevent falls. This information is not intended to replace advice given to you by your health care provider. Make sure you discuss any questions you have with your health care provider. Document Revised: 08/03/2020 Document Reviewed: 08/03/2020 Elsevier Patient Education  2024 [employer] Inc.  

## 2022-10-17 ENCOUNTER — Ambulatory Visit: Payer: Medicare HMO | Attending: Cardiology | Admitting: Cardiology

## 2022-10-17 ENCOUNTER — Encounter: Payer: Self-pay | Admitting: Cardiology

## 2022-10-17 VITALS — BP 136/80 | HR 92 | Ht 67.0 in | Wt 217.0 lb

## 2022-10-17 DIAGNOSIS — I480 Paroxysmal atrial fibrillation: Secondary | ICD-10-CM

## 2022-10-17 DIAGNOSIS — E782 Mixed hyperlipidemia: Secondary | ICD-10-CM

## 2022-10-17 DIAGNOSIS — I25119 Atherosclerotic heart disease of native coronary artery with unspecified angina pectoris: Secondary | ICD-10-CM | POA: Diagnosis not present

## 2022-10-17 NOTE — Progress Notes (Signed)
    Cardiology Office Note  Date: 10/17/2022   ID: Daniel Short, DOB 02-02-1946, MRN [id number]  History of Present Illness: Daniel Short is a 77 y.o. male last seen in January.  He is here today with his wife for a follow-up visit.  He does not report any palpitations.  Fairly sedentary at home, he uses a cane and has a lot of trouble with knee discomfort.  He does not report any recent falls.  ECG today shows sinus rhythm with PACs, incomplete right bundle branch block and left anterior fascicular block.  I reviewed his medications.  No change in cardiac regimen.  His last LDL was 46.  I reviewed interval lab work showing normal renal function.  Physical Exam: VS:  BP 136/80   Pulse 92   Ht 5\' 7"  (1.702 m)   Wt 217 lb (98.4 kg)   SpO2 98%   BMI 33.99 kg/m , BMI Body mass index is 33.99 kg/m.  Wt Readings from Last 3 Encounters:  10/17/22 217 lb (98.4 kg)  10/04/22 217 lb (98.4 kg)  04/21/22 215 lb (97.5 kg)    General: Patient appears comfortable at rest. HEENT: Conjunctiva and lids normal. Neck: Supple, no elevated JVP or carotid bruits. Lungs: Clear to auscultation, nonlabored breathing at rest. Cardiac: Regular rate and rhythm, no S3, 1/6 systolic murmur. Extremities: No pitting edema.  ECG:  An ECG dated 09/04/2021 was personally reviewed today and demonstrated:  Sinus rhythm left anterior fascicular block and nonspecific T wave changes.  Labwork: 12/07/2021: TSH 2.440 04/21/2022: Hemoglobin 14.0; Platelets 195 10/04/2022: ALT 24; AST 26; BUN 10; Creatinine, Ser 1.14; Potassium 4.4; Sodium 140     Component Value Date/Time   CHOL 96 (L) 12/07/2021 1509   TRIG 107 12/07/2021 1509   HDL 30 (L) 12/07/2021 1509   CHOLHDL 3.2 12/07/2021 1509   CHOLHDL 3.8 05/28/2020 0423   VLDL 24 05/28/2020 0423   LDLCALC 46 12/07/2021 1509   LDLDIRECT 82 05/19/2016 1616   Other Studies Reviewed Today:  No interval cardiac testing for review today.  Assessment and  Plan:  1.  CAD status post BMS to the ostial/proximal RCA in 2003.  LVEF approximately 50% by echocardiogram in March 2022.  He denies any active angina with low-level activity and we continue with medical therapy and observation.  Currently on aspirin, Lipitor, and Lopressor.  2.  Paroxysmal atrial fibrillation with CHA2DS2-VASc score of 5.  He is not anticoagulated with prior history of subarachnoid hemorrhage and hemorrhagic stroke.  He has declined Watchman device consultation as well.  3.  Mixed hyperlipidemia.  LDL 46 in September 2023.  Continue Lipitor.  Disposition:  Follow up  6 months.  Signed, Jonelle Sidle, M.D., F.A.C.C. [facility]

## 2022-10-17 NOTE — Patient Instructions (Addendum)

## 2022-12-13 ENCOUNTER — Ambulatory Visit (INDEPENDENT_AMBULATORY_CARE_PROVIDER_SITE_OTHER): Payer: Medicare HMO | Admitting: Family

## 2022-12-13 ENCOUNTER — Encounter: Payer: Self-pay | Admitting: Family

## 2022-12-13 ENCOUNTER — Telehealth: Payer: Self-pay | Admitting: Family

## 2022-12-13 VITALS — BP 121/88 | HR 80 | Temp 97.0°F | Ht 67.0 in | Wt 217.8 lb

## 2022-12-13 DIAGNOSIS — R040 Epistaxis: Secondary | ICD-10-CM

## 2022-12-13 DIAGNOSIS — M545 Low back pain, unspecified: Secondary | ICD-10-CM | POA: Diagnosis not present

## 2022-12-13 DIAGNOSIS — G8929 Other chronic pain: Secondary | ICD-10-CM | POA: Diagnosis not present

## 2022-12-13 DIAGNOSIS — J301 Allergic rhinitis due to pollen: Secondary | ICD-10-CM

## 2022-12-13 MED ORDER — LORATADINE 10 MG PO TABS: 10.0000 mg | ORAL_TABLET | Freq: Every day | ORAL | 1 refills | Status: DC

## 2022-12-13 MED ORDER — AYR SALINE NASAL NA GEL: 1.0000 | NASAL | 1 refills | Status: DC | PRN

## 2022-12-13 NOTE — Telephone Encounter (Signed)
Called to find out what was really going on with his hand and called 3 times busy each time.

## 2022-12-13 NOTE — Telephone Encounter (Signed)
I am unsure what is wrong with his hand.

## 2022-12-13 NOTE — Patient Instructions (Signed)
Nosebleed, Adult ?A nosebleed is when blood comes out of the nose. Nosebleeds are common. Usually, they are not a sign of a serious condition. ?Nosebleeds can happen if a blood vessel in your nose starts to bleed or if the lining of your nose (mucous membrane) cracks. They are commonly caused by: ?Allergies. ?Colds. ?Picking your nose. ?Blowing your nose too hard. ?An injury from sticking an object into your nose or getting hit in the nose. ?Dry or cold air. ?Less common causes of nosebleeds include: ?Toxic fumes. ?Something abnormal in the nose or in the air-filled spaces in the bones of the face (sinuses). ?Growths in the nose, such as polyps. ?Blood thinners or conditions that cause blood to clot slowly. ?Certain illnesses or procedures that irritate or dry out the nasal passages. ?Follow these instructions at home: ?When you have a nosebleed: ? ?Sit down and tilt your head slightly forward. ?Use a clean towel or tissue to pinch your nostrils under the bony part of your nose. After 5 minutes, let go of your nose and see if bleeding starts again. Do not release pressure before that time. If there is still bleeding, repeat the pinching and holding for 5 minutes or until the bleeding stops. ?Do not place tissues or gauze in the nose to stop the bleeding. ?Avoid lying down and avoid tilting your head backward. That may make blood collect in the throat and cause gagging or coughing. ?Use a nasal spray decongestant to help with a nosebleed as told by your health care provider. ?After a nosebleed: ?Avoid blowing your nose or sniffing for a number of hours. ?Avoid straining, lifting, or bending at the waist for several days. You may go back to other normal activities as you are able. ?If you are taking aspirin or blood thinners and you have nosebleeds, talk to your health care provider. These medicines make bleeding more likely. ?Ask your health care provider if you should stop taking the medicines or if you should  adjust the dose. ?Do not stop taking medicines that your health care provider has recommended unless he or she tells you to stop taking them. ?If your nosebleed was caused by dry mucous membranes, use over-the-counter saline nasal spray or gel and a humidifier as told by your health care provider. This will keep the mucous membranes moist and allow them to heal. If you need to use one of these products: ?Choose one that is water-soluble. ?Use only as much as you need and use it only as often as needed. ?Do not lie down right after you use it. ?If you get nosebleeds often, talk with your health care provider about medical treatments. Options may include: ?Nasal cautery. This treatment stops and prevents nosebleeds by using a chemical swab or electrical device to lightly burn tiny blood vessels inside the nose. ?Nasal packing. A gauze or other material is placed in the nose to keep constant pressure on the bleeding area. ?Contact a health care provider if you: ?Have a fever. ?Get nosebleeds often or more often than usual. ?Bruise very easily. ?Have a nosebleed from having something stuck in your nose. ?Have bleeding in your mouth. ?Vomit or cough up brown material. ?Have a nosebleed after you start a new medicine. ?Get help right away if: ?You have a nosebleed after a fall or a head injury. ?Your nosebleed does not go away after 20 minutes. ?You feel dizzy or weak. ?You have unusual bleeding from other parts of your body. ?You have unusual bruising on  other parts of your body. ?You become sweaty. ?You vomit blood. ?Summary ?A nosebleed is when blood comes out of the nose. Common causes include allergies, an injury to the nose, or cold or dry air. ?Initial treatment includes applying pressure for 5 minutes. ?Moisturizing the nose with saline nasal spray or gel after a nosebleed may help prevent future bleeding. ?Get help right away if your nosebleed does not go away after 20 minutes. ?This information is not intended  to replace advice given to you by your health care provider. Make sure you discuss any questions you have with your health care provider. ?Document Revised: 01/10/2019 Document Reviewed: 01/10/2019 ?Elsevier Patient Education ? 2022 Elsevier Inc[employer] ? ?

## 2022-12-13 NOTE — Progress Notes (Signed)
Subjective:    Patient ID: Daniel Short, male    DOB: 1946/02/08, 77 y.o.   MRN: [id number]  Chief Complaint  Patient presents with   Epistaxis   Headache   Pain   Pt presents to the office today for epistaxis for the last two days that has resolved now.   He is complaining of increased back pain with the raining.  Epistaxis  The bleeding has been from the left nare. This is a new problem. The current episode started in the past 7 days. The problem occurs every few minutes. The problem has been gradually improving. The bleeding is associated with dry air. He has tried pressure for the symptoms. The treatment provided mild relief.  Headache  Associated symptoms include back pain.  Back Pain This is a chronic problem. The current episode started more than 1 year ago. The problem has been waxing and waning since onset. The pain is present in the lumbar spine. The quality of the pain is described as aching. The pain is at a severity of 8/10. The pain is moderate. Associated symptoms include headaches. Risk factors include obesity. He has tried analgesics for the symptoms. The treatment provided mild relief.      Review of Systems  HENT:  Positive for nosebleeds.   Musculoskeletal:  Positive for back pain.  Neurological:  Positive for headaches.  All other systems reviewed and are negative.      Objective:   Physical Exam Vitals reviewed.  Constitutional:      General: He is not in acute distress.    Appearance: He is well-developed.  HENT:     Head: Normocephalic.     Right Ear: External ear normal.     Left Ear: External ear normal.  Eyes:     General:        Right eye: No discharge.        Left eye: No discharge.     Pupils: Pupils are equal, round, and reactive to light.  Neck:     Thyroid: No thyromegaly.  Cardiovascular:     Rate and Rhythm: Normal rate and regular rhythm.     Heart sounds: Normal heart sounds. No murmur heard. Pulmonary:     Effort:  Pulmonary effort is normal. No respiratory distress.     Breath sounds: Normal breath sounds. No wheezing.  Abdominal:     General: Bowel sounds are normal. There is no distension.     Palpations: Abdomen is soft.     Tenderness: There is no abdominal tenderness.  Musculoskeletal:        General: No tenderness.     Cervical back: Normal range of motion and neck supple.     Comments: Pain in lumbar with flexion  Skin:    General: Skin is warm and dry.     Findings: No erythema or rash.  Neurological:     Mental Status: He is alert and oriented to person, place, and time.     Cranial Nerves: No cranial nerve deficit.     Deep Tendon Reflexes: Reflexes are normal and symmetric.  Psychiatric:        Behavior: Behavior normal.        Thought Content: Thought content normal.        Judgment: Judgment normal.     BP 121/88   Pulse 80   Temp (!) 97 F (36.1 C) (Temporal)   Ht 5\' 7"  (1.702 m)   Wt 217 lb 12.8 oz (  98.8 kg)   SpO2 95%   BMI 34.11 kg/m        Assessment & Plan:   THEOPOLIS AMESQUITA comes in today with chief complaint of Epistaxis, Headache, and Pain   Diagnosis and orders addressed:  1. Epistaxis - saline (AYR) GEL; Place 1 Application into both nostrils every 4 (four) hours as needed.  Dispense: 14.1 g; Refill: 1  2. Seasonal allergic rhinitis due to pollen - loratadine (CLARITIN) 10 MG tablet; Take 1 tablet (10 mg total) by mouth daily.  Dispense: 90 tablet; Refill: 1   3. Chronic low back pain, unspecified back pain laterality, unspecified whether sciatica present Will not increase medications at this time Can use motrin or tylenol with Norco. Did not take any medication over the weekend, but states since it started raining his pain is worse.    Use Saline Ayr gel as needed Start Claritin  Avoid allergens  Health Maintenance reviewed Diet and exercise encouraged  Follow up plan: Keep chronic follow up   Jannifer Rodney, FNP

## 2022-12-28 NOTE — Telephone Encounter (Signed)
No return call will close encounter. 

## 2023-01-27 ENCOUNTER — Other Ambulatory Visit: Payer: Self-pay | Admitting: Family

## 2023-01-27 DIAGNOSIS — E1169 Type 2 diabetes mellitus with other specified complication: Secondary | ICD-10-CM

## 2023-03-15 ENCOUNTER — Telehealth: Payer: Self-pay

## 2023-03-15 NOTE — Telephone Encounter (Signed)
Copied from CRM [id number]. Topic: Clinical - Prescription Issue >> Mar 15, 2023  2:40 PM Carlatta H wrote: Reason for CRM: Patients wife called in stating the pain medication he has isnt working//Well visit was scheduled for 1/20 but they want the Script changed//Please call  Yoshiaki Hewlett (spouse) at [phone]

## 2023-03-16 NOTE — Telephone Encounter (Signed)
I am sorry but can not prescribed controlled mediations without a visit.

## 2023-03-16 NOTE — Telephone Encounter (Signed)
Called and spoke with husband and he states he will call back with wife to set up virtual visit with [facility] tomorrow. Please schedule when they call back

## 2023-03-28 ENCOUNTER — Other Ambulatory Visit: Payer: Self-pay | Admitting: Family

## 2023-03-28 DIAGNOSIS — I152 Hypertension secondary to endocrine disorders: Secondary | ICD-10-CM

## 2023-03-30 ENCOUNTER — Ambulatory Visit: Payer: Medicare HMO | Admitting: Family Medicine

## 2023-04-07 ENCOUNTER — Encounter: Payer: Self-pay | Admitting: Family

## 2023-04-07 ENCOUNTER — Ambulatory Visit (INDEPENDENT_AMBULATORY_CARE_PROVIDER_SITE_OTHER): Payer: Medicare HMO

## 2023-04-07 ENCOUNTER — Ambulatory Visit (INDEPENDENT_AMBULATORY_CARE_PROVIDER_SITE_OTHER): Payer: Medicare HMO | Admitting: Family

## 2023-04-07 VITALS — BP 119/74 | HR 66 | Temp 96.8°F | Ht 67.0 in | Wt 219.6 lb

## 2023-04-07 DIAGNOSIS — Z79899 Other long term (current) drug therapy: Secondary | ICD-10-CM | POA: Diagnosis not present

## 2023-04-07 DIAGNOSIS — K219 Gastro-esophageal reflux disease without esophagitis: Secondary | ICD-10-CM

## 2023-04-07 DIAGNOSIS — M109 Gout, unspecified: Secondary | ICD-10-CM

## 2023-04-07 DIAGNOSIS — I2510 Atherosclerotic heart disease of native coronary artery without angina pectoris: Secondary | ICD-10-CM

## 2023-04-07 DIAGNOSIS — I152 Hypertension secondary to endocrine disorders: Secondary | ICD-10-CM | POA: Diagnosis not present

## 2023-04-07 DIAGNOSIS — I480 Paroxysmal atrial fibrillation: Secondary | ICD-10-CM

## 2023-04-07 DIAGNOSIS — R1011 Right upper quadrant pain: Secondary | ICD-10-CM

## 2023-04-07 DIAGNOSIS — Z9049 Acquired absence of other specified parts of digestive tract: Secondary | ICD-10-CM

## 2023-04-07 DIAGNOSIS — E669 Obesity, unspecified: Secondary | ICD-10-CM

## 2023-04-07 DIAGNOSIS — E785 Hyperlipidemia, unspecified: Secondary | ICD-10-CM | POA: Diagnosis not present

## 2023-04-07 DIAGNOSIS — M545 Low back pain, unspecified: Secondary | ICD-10-CM

## 2023-04-07 DIAGNOSIS — E1169 Type 2 diabetes mellitus with other specified complication: Secondary | ICD-10-CM

## 2023-04-07 DIAGNOSIS — G8929 Other chronic pain: Secondary | ICD-10-CM | POA: Diagnosis not present

## 2023-04-07 DIAGNOSIS — E1159 Type 2 diabetes mellitus with other circulatory complications: Secondary | ICD-10-CM

## 2023-04-07 DIAGNOSIS — I4891 Unspecified atrial fibrillation: Secondary | ICD-10-CM | POA: Diagnosis not present

## 2023-04-07 LAB — BAYER DCA HB A1C WAIVED: HB A1C (BAYER DCA - WAIVED): 6.1 % — ABNORMAL HIGH (ref 4.8–5.6)

## 2023-04-07 MED ORDER — HYDROCODONE-ACETAMINOPHEN 5-325 MG PO TABS: 1.0000 | ORAL_TABLET | Freq: Four times a day (QID) | ORAL | 0 refills | Status: DC | PRN

## 2023-04-07 MED ORDER — LISINOPRIL 20 MG PO TABS: 20.0000 mg | ORAL_TABLET | Freq: Every day | ORAL | 2 refills | Status: DC

## 2023-04-07 MED ORDER — METOPROLOL TARTRATE 25 MG PO TABS: ORAL_TABLET | ORAL | 0 refills | Status: DC

## 2023-04-07 MED ORDER — OMEPRAZOLE 20 MG PO CPDR: 20.0000 mg | DELAYED_RELEASE_CAPSULE | Freq: Every day | ORAL | 1 refills | Status: DC

## 2023-04-07 MED ORDER — ONETOUCH VERIO FLEX SYSTEM W/DEVICE KIT: PACK | 1 refills | Status: AC

## 2023-04-07 MED ORDER — ATORVASTATIN CALCIUM 20 MG PO TABS: 20.0000 mg | ORAL_TABLET | Freq: Every day | ORAL | 0 refills | Status: DC

## 2023-04-07 MED ORDER — METFORMIN HCL ER 500 MG PO TB24: ORAL_TABLET | ORAL | 2 refills | Status: DC

## 2023-04-07 MED ORDER — ALLOPURINOL 100 MG PO TABS: ORAL_TABLET | ORAL | 3 refills | Status: DC

## 2023-04-07 NOTE — Patient Instructions (Signed)
 Abdominal Pain, Adult  Pain in the abdomen (abdominal pain) can be caused by many things. In most cases, it gets better with no treatment or by being treated at home. But in some cases, it can be serious. Your health care provider will ask questions about your medical history and do a physical exam to try to figure out what is causing your pain. Follow these instructions at home: Medicines Take over-the-counter and prescription medicines only as told by your provider. Do not take medicines that help you poop (laxatives) unless told by your provider. General instructions Watch your condition for any changes. Drink enough fluid to keep your pee (urine) pale yellow. Contact a health care provider if: Your pain changes, gets worse, or lasts longer than expected. You have severe cramping or bloating in your abdomen, or you vomit. Your pain gets worse with meals, after eating, or with certain foods. You are constipated or have diarrhea for more than 2-3 days. You are not hungry, or you lose weight without trying. You have signs of dehydration. These may include: Dark pee, very little pee, or no pee. Cracked lips or dry mouth. Sleepiness or weakness. You have pain when you pee (urinate) or poop. Your abdominal pain wakes you up at night. You have blood in your pee. You have a fever. Get help right away if: You cannot stop vomiting. Your pain is only in one part of the abdomen. Pain on the right side could be caused by appendicitis. You have bloody or black poop (stool), or poop that looks like tar. You have trouble breathing. You have chest pain. These symptoms may be an emergency. Get help right away. Call 911. Do not wait to see if the symptoms will go away. Do not drive yourself to the hospital. This information is not intended to replace advice given to you by your health care provider. Make sure you discuss any questions you have with your health care provider. Document Revised:  12/29/2021 Document Reviewed: 12/29/2021 Elsevier Patient Education  2024 [employer].

## 2023-04-07 NOTE — Progress Notes (Addendum)
 Subjective:    Patient ID: Daniel Short, male    DOB: 07-20-1945, 78 y.o.   MRN: [id number]  Chief Complaint  Patient presents with   Medical Management of Chronic Issues   PT presents to the office today for chronic follow up. Pt is followed by Cardiologists every 6 months for CAD, A Fib, and hx of MI.   He has hx of gout and takes allopurinol  100 mg daily. Does not remember last flare up.  Hypertension This is a chronic problem. The current episode started more than 1 year ago. The problem has been resolved since onset. The problem is controlled. Associated symptoms include blurred vision and peripheral edema. Pertinent negatives include no headaches, malaise/fatigue or shortness of breath. Risk factors for coronary artery disease include obesity, male gender and dyslipidemia. The current treatment provides moderate improvement.  Hyperlipidemia This is a chronic problem. The current episode started more than 1 year ago. The problem is controlled. Recent lipid tests were reviewed and are normal. Exacerbating diseases include obesity. Pertinent negatives include no shortness of breath. Current antihyperlipidemic treatment includes statins. The current treatment provides moderate improvement of lipids. Risk factors for coronary artery disease include dyslipidemia, hypertension, male sex and a sedentary lifestyle.  Diabetes He presents for his follow-up diabetic visit. He has type 2 diabetes mellitus. Pertinent negatives for hypoglycemia include no headaches. Associated symptoms include blurred vision. Pertinent negatives for diabetes include no foot paresthesias. Symptoms are stable. Diabetic complications include peripheral neuropathy. Risk factors for coronary artery disease include dyslipidemia, diabetes mellitus, hypertension, male sex and sedentary lifestyle. (Does not check glucose at home) Eye exam is current.  Back Pain This is a chronic problem. The current episode started more than 1  year ago. The problem occurs intermittently. The problem has been waxing and waning since onset. The pain is present in the lumbar spine. The quality of the pain is described as aching. The pain is at a severity of 9/10. The pain is moderate. Associated symptoms include abdominal pain. Pertinent negatives include no fever or headaches. Risk factors include obesity. He has tried analgesics for the symptoms. The treatment provided mild relief.  Abdominal Pain This is a new problem. The current episode started more than 1 month ago. The onset quality is sudden. The problem occurs intermittently. The problem has been waxing and waning. The pain is located in the RUQ. The pain is at a severity of 9/10. The pain is moderate. The quality of the pain is aching. The abdominal pain radiates to the RLQ. Pertinent negatives include no belching, constipation, diarrhea, fever, flatus, frequency, headaches, hematochezia, nausea or vomiting. The pain is relieved by Being still. The treatment provided no relief.   Current opioids rx- Norco 5-325 mg # meds rx- 60 Effectiveness of current meds-not helping with pain at this time Adverse reactions from pain meds-None Morphine  equivalent- 20   Pill count performed-No Last drug screen - 04/07/23 ( high risk q79m, moderate risk q32m, low risk yearly ) Urine drug screen today- Yes Was the [facility] reviewed- Yes             If yes were their any concerning findings? - Pt was getting from Pain clinic, will start prescribing   Pain contract signed on: 04/07/23    Review of Systems  Constitutional:  Negative for fever and malaise/fatigue.  Eyes:  Positive for blurred vision.  Respiratory:  Negative for shortness of breath.   Gastrointestinal:  Positive for abdominal pain. Negative for constipation,  diarrhea, flatus, hematochezia, nausea and vomiting.  Genitourinary:  Negative for frequency.  Musculoskeletal:  Positive for back pain.  Neurological:  Negative for headaches.   All other systems reviewed and are negative.      Objective:   Physical Exam Vitals reviewed.  Constitutional:      General: He is not in acute distress.    Appearance: He is well-developed. He is obese.  HENT:     Head: Normocephalic.     Right Ear: Tympanic membrane normal.     Left Ear: Tympanic membrane normal.  Eyes:     General:        Right eye: No discharge.        Left eye: No discharge.     Pupils: Pupils are equal, round, and reactive to light.  Neck:     Thyroid : No thyromegaly.  Cardiovascular:     Rate and Rhythm: Normal rate and regular rhythm.     Heart sounds: Normal heart sounds. No murmur heard. Pulmonary:     Effort: Pulmonary effort is normal. No respiratory distress.     Breath sounds: Normal breath sounds. No wheezing.  Abdominal:     General: Bowel sounds are normal. There is no distension.     Palpations: Abdomen is soft.     Tenderness: There is no abdominal tenderness (no tenderness noted on exam).  Musculoskeletal:        General: No tenderness. Normal range of motion.     Cervical back: Normal range of motion and neck supple.     Comments: Pain in lumbar with flexion and extension  Skin:    General: Skin is warm and dry.     Findings: No erythema or rash.  Neurological:     Mental Status: He is alert and oriented to person, place, and time.     Cranial Nerves: No cranial nerve deficit.     Motor: Weakness (walking with cane) present.     Gait: Gait abnormal.     Deep Tendon Reflexes: Reflexes are normal and symmetric.  Psychiatric:        Behavior: Behavior normal.        Thought Content: Thought content normal.        Judgment: Judgment normal.       BP 119/74   Pulse 66   Temp (!) 96.8 F (36 C) (Temporal)   Ht 5' 7 (1.702 m)   Wt 219 lb 9.6 oz (99.6 kg)   SpO2 97%   BMI 34.39 kg/m      Assessment & Plan:   SIMCHA SPEIR comes in today with chief complaint of Medical Management of Chronic Issues   Diagnosis and  orders addressed:  1. Coronary artery disease involving native coronary artery of native heart without angina pectoris - CBC with Differential/Platelet - CMP14+EGFR  2. Hyperlipidemia associated with type 2 diabetes mellitus (HCC) - CBC with Differential/Platelet - CMP14+EGFR - Lipid panel - atorvastatin  (LIPITOR) 20 MG tablet; Take 1 tablet (20 mg total) by mouth daily.  Dispense: 90 tablet; Refill: 0  3. Hypertension associated with diabetes (HCC) - CBC with Differential/Platelet - CMP14+EGFR - lisinopril  (ZESTRIL ) 20 MG tablet; Take 1 tablet (20 mg total) by mouth daily.  Dispense: 90 tablet; Refill: 2 - metoprolol  tartrate (LOPRESSOR ) 25 MG tablet; TAKE ONE TABLET BY MOUTH EVERY MORNING AND ONE-HALF TABLET EVERY EVENING  Dispense: 135 tablet; Refill: 0  4. Type 2 diabetes mellitus with other specified complication, without long-term current use of  insulin  (HCC) (Primary) - Microalbumin / creatinine urine ratio - CBC with Differential/Platelet - CMP14+EGFR - metFORMIN  (GLUCOPHAGE -XR) 500 MG 24 hr tablet; TAKE ONE TABLET ONCE DAILY WITH BREAKFAST  Dispense: 90 tablet; Refill: 2 - Blood Glucose Monitoring Suppl (ONETOUCH VERIO FLEX SYSTEM) w/Device KIT; Use to test blood sugar daily as directed. DX: E11.65  Dispense: 1 kit; Refill: 1 - Bayer DCA Hb A1c Waived  5. Chronic low back pain, unspecified back pain laterality, unspecified whether sciatica present - CBC with Differential/Platelet - CMP14+EGFR - HYDROcodone -acetaminophen  (NORCO) 5-325 MG tablet; Take 1 tablet by mouth every 6 (six) hours as needed for moderate pain (pain score 4-6).  Dispense: 90 tablet; Refill: 0 - HYDROcodone -acetaminophen  (NORCO) 5-325 MG tablet; Take 1 tablet by mouth every 6 (six) hours as needed for moderate pain (pain score 4-6).  Dispense: 90 tablet; Refill: 0 - HYDROcodone -acetaminophen  (NORCO/VICODIN) 5-325 MG tablet; Take 1 tablet by mouth every 6 (six) hours as needed for moderate pain (pain score  4-6).  Dispense: 90 tablet; Refill: 0 - ToxASSURE Select 13 (MW), Urine  6. Atrial fibrillation with RVR (HCC) - CBC with Differential/Platelet - CMP14+EGFR  7. Obesity (BMI 30-39.9) - CBC with Differential/Platelet - CMP14+EGFR  8. Paroxysmal atrial fibrillation (HCC) - CBC with Differential/Platelet - CMP14+EGFR  9. Controlled substance agreement signed - CBC with Differential/Platelet - CMP14+EGFR - HYDROcodone -acetaminophen  (NORCO) 5-325 MG tablet; Take 1 tablet by mouth every 6 (six) hours as needed for moderate pain (pain score 4-6).  Dispense: 90 tablet; Refill: 0 - HYDROcodone -acetaminophen  (NORCO) 5-325 MG tablet; Take 1 tablet by mouth every 6 (six) hours as needed for moderate pain (pain score 4-6).  Dispense: 90 tablet; Refill: 0 - HYDROcodone -acetaminophen  (NORCO/VICODIN) 5-325 MG tablet; Take 1 tablet by mouth every 6 (six) hours as needed for moderate pain (pain score 4-6).  Dispense: 90 tablet; Refill: 0  10. Acute gout involving toe of right foot, unspecified cause - CBC with Differential/Platelet - CMP14+EGFR - allopurinol  (ZYLOPRIM ) 100 MG tablet; TAKE 1 TABLET ONCE DAILY FOR GOUT  Dispense: 90 tablet; Refill: 3  11. History of cholecystectomy - CBC with Differential/Platelet - CMP14+EGFR - DG Abd 1 View; Future  12. RUQ pain Start Prilosec 20 mg  -Diet discussed- Avoid fried, spicy, citrus foods, caffeine and alcohol -Do not eat 2-3 hours before bedtime -Encouraged small frequent meals -Avoid NSAID's Had cholecystectomy in 2023 - DG Abd 1 View; Future - omeprazole  (PRILOSEC) 20 MG capsule; Take 1 capsule (20 mg total) by mouth daily.  Dispense: 90 capsule; Refill: 1  13. Gastroesophageal reflux disease without esophagitis -Diet discussed- Avoid fried, spicy, citrus foods, caffeine and alcohol -Do not eat 2-3 hours before bedtime -Encouraged small frequent meals -Avoid NSAID's - omeprazole  (PRILOSEC) 20 MG capsule; Take 1 capsule (20 mg total) by  mouth daily.  Dispense: 90 capsule; Refill: 1   Labs pending Patient reviewed in [place] controlled database, no flags noted. Contract and drug screen are up to date.  Will increase Norco to #90 from #60 Start PPI -Diet discussed- Avoid fried, spicy, citrus foods, caffeine and alcohol -Do not eat 2-3 hours before bedtime -Encouraged small frequent meals -Avoid NSAID's Health Maintenance reviewed Diet and exercise encouraged  Follow up plan: 3 months    Bari Learn, FNP

## 2023-04-08 LAB — CMP14+EGFR
ALT: 26 [IU]/L (ref 0–44)
AST: 23 [IU]/L (ref 0–40)
Albumin: 4.6 g/dL (ref 3.8–4.8)
Alkaline Phosphatase: 74 [IU]/L (ref 44–121)
BUN/Creatinine Ratio: 6 — ABNORMAL LOW (ref 10–24)
BUN: 8 mg/dL (ref 8–27)
Bilirubin Total: 1.7 mg/dL — ABNORMAL HIGH (ref 0.0–1.2)
CO2: 20 mmol/L (ref 20–29)
Calcium: 9.6 mg/dL (ref 8.6–10.2)
Chloride: 102 mmol/L (ref 96–106)
Creatinine, Ser: 1.31 mg/dL — ABNORMAL HIGH (ref 0.76–1.27)
Globulin, Total: 2.3 g/dL (ref 1.5–4.5)
Glucose: 139 mg/dL — ABNORMAL HIGH (ref 70–99)
Potassium: 4.3 mmol/L (ref 3.5–5.2)
Sodium: 139 mmol/L (ref 134–144)
Total Protein: 6.9 g/dL (ref 6.0–8.5)
eGFR: 56 mL/min/{1.73_m2} — ABNORMAL LOW (ref >59)

## 2023-04-08 LAB — LIPID PANEL
Chol/HDL Ratio: 3.4 {ratio} (ref 0.0–5.0)
Cholesterol, Total: 124 mg/dL (ref 100–199)
HDL: 36 mg/dL — ABNORMAL LOW (ref >39)
LDL Chol Calc (NIH): 63 mg/dL (ref 0–99)
Triglycerides: 146 mg/dL (ref 0–149)
VLDL Cholesterol Cal: 25 mg/dL (ref 5–40)

## 2023-04-08 LAB — CBC WITH DIFFERENTIAL/PLATELET
Basophils Absolute: 0.1 10*3/uL (ref 0.0–0.2)
Basos: 1 %
EOS (ABSOLUTE): 0.2 10*3/uL (ref 0.0–0.4)
Eos: 2 %
Hematocrit: 43.7 % (ref 37.5–51.0)
Hemoglobin: 14.5 g/dL (ref 13.0–17.7)
Immature Grans (Abs): 0 10*3/uL (ref 0.0–0.1)
Immature Granulocytes: 0 %
Lymphocytes Absolute: 3.1 10*3/uL (ref 0.7–3.1)
Lymphs: 35 %
MCH: 31.5 pg (ref 26.6–33.0)
MCHC: 33.2 g/dL (ref 31.5–35.7)
MCV: 95 fL (ref 79–97)
Monocytes Absolute: 0.9 10*3/uL (ref 0.1–0.9)
Monocytes: 10 %
Neutrophils Absolute: 4.7 10*3/uL (ref 1.4–7.0)
Neutrophils: 52 %
Platelets: 231 10*3/uL (ref 150–450)
RBC: 4.61 x10E6/uL (ref 4.14–5.80)
RDW: 12.3 % (ref 11.6–15.4)
WBC: 9.1 10*3/uL (ref 3.4–10.8)

## 2023-04-09 LAB — MICROALBUMIN / CREATININE URINE RATIO
Creatinine, Urine: 312 mg/dL
Microalb/Creat Ratio: 40 mg/g{creat} — ABNORMAL HIGH (ref 0–29)
Microalbumin, Urine: 125.2 ug/mL

## 2023-04-12 LAB — TOXASSURE SELECT 13 (MW), URINE

## 2023-04-17 ENCOUNTER — Encounter: Payer: Medicare HMO | Admitting: Family

## 2023-06-02 ENCOUNTER — Other Ambulatory Visit: Payer: Self-pay | Admitting: Family

## 2023-06-02 DIAGNOSIS — J301 Allergic rhinitis due to pollen: Secondary | ICD-10-CM

## 2023-07-07 ENCOUNTER — Encounter: Payer: Self-pay | Admitting: Family

## 2023-07-07 ENCOUNTER — Ambulatory Visit: Admitting: Family

## 2023-07-07 VITALS — BP 125/71 | HR 64 | Temp 97.9°F | Ht 67.0 in | Wt 220.0 lb

## 2023-07-07 DIAGNOSIS — I152 Hypertension secondary to endocrine disorders: Secondary | ICD-10-CM

## 2023-07-07 DIAGNOSIS — M109 Gout, unspecified: Secondary | ICD-10-CM | POA: Diagnosis not present

## 2023-07-07 DIAGNOSIS — E1159 Type 2 diabetes mellitus with other circulatory complications: Secondary | ICD-10-CM

## 2023-07-07 DIAGNOSIS — E669 Obesity, unspecified: Secondary | ICD-10-CM | POA: Diagnosis not present

## 2023-07-07 DIAGNOSIS — G8929 Other chronic pain: Secondary | ICD-10-CM | POA: Diagnosis not present

## 2023-07-07 DIAGNOSIS — I4891 Unspecified atrial fibrillation: Secondary | ICD-10-CM

## 2023-07-07 DIAGNOSIS — E1169 Type 2 diabetes mellitus with other specified complication: Secondary | ICD-10-CM

## 2023-07-07 DIAGNOSIS — M545 Low back pain, unspecified: Secondary | ICD-10-CM | POA: Diagnosis not present

## 2023-07-07 DIAGNOSIS — Z79899 Other long term (current) drug therapy: Secondary | ICD-10-CM

## 2023-07-07 DIAGNOSIS — K219 Gastro-esophageal reflux disease without esophagitis: Secondary | ICD-10-CM

## 2023-07-07 DIAGNOSIS — Z0000 Encounter for general adult medical examination without abnormal findings: Secondary | ICD-10-CM | POA: Diagnosis not present

## 2023-07-07 DIAGNOSIS — E785 Hyperlipidemia, unspecified: Secondary | ICD-10-CM | POA: Diagnosis not present

## 2023-07-07 DIAGNOSIS — I2510 Atherosclerotic heart disease of native coronary artery without angina pectoris: Secondary | ICD-10-CM

## 2023-07-07 DIAGNOSIS — Z6834 Body mass index (BMI) 34.0-34.9, adult: Secondary | ICD-10-CM

## 2023-07-07 LAB — BAYER DCA HB A1C WAIVED: HB A1C (BAYER DCA - WAIVED): 6.5 % — ABNORMAL HIGH (ref 4.8–5.6)

## 2023-07-07 MED ORDER — HYDROCODONE-ACETAMINOPHEN 7.5-325 MG PO TABS: 1.0000 | ORAL_TABLET | Freq: Three times a day (TID) | ORAL | 0 refills | Status: DC | PRN

## 2023-07-07 NOTE — Patient Instructions (Signed)

## 2023-07-07 NOTE — Progress Notes (Signed)
 Subjective:    Patient ID: Daniel Short, male    DOB: 03-Jun-1945, 78 y.o.   MRN: [id number]  Chief Complaint  Patient presents with   Medical Management of Chronic Issues   PT presents to the office today for chronic follow up. Pt is followed by Cardiologists every 6 months for CAD, A Fib, and hx of MI.   He has hx of gout and takes allopurinol 100 mg daily. Does not remember last flare up.  Hypertension This is a chronic problem. The current episode started more than 1 year ago. The problem has been resolved since onset. The problem is controlled. Associated symptoms include blurred vision, malaise/fatigue and shortness of breath. Pertinent negatives include no peripheral edema. Risk factors for coronary artery disease include obesity, male gender and dyslipidemia. The current treatment provides moderate improvement.  Hyperlipidemia This is a chronic problem. The current episode started more than 1 year ago. The problem is controlled. Recent lipid tests were reviewed and are normal. Exacerbating diseases include obesity. Associated symptoms include shortness of breath. Current antihyperlipidemic treatment includes statins. The current treatment provides moderate improvement of lipids. Risk factors for coronary artery disease include dyslipidemia, hypertension, male sex and a sedentary lifestyle.  Diabetes He presents for his follow-up diabetic visit. He has type 2 diabetes mellitus. Associated symptoms include blurred vision. Pertinent negatives for diabetes include no foot paresthesias. Symptoms are stable. Diabetic complications include peripheral neuropathy. Risk factors for coronary artery disease include dyslipidemia, diabetes mellitus, hypertension, male sex and sedentary lifestyle. He is following a generally unhealthy diet. (Does not check glucose at home) Eye exam is current.  Back Pain This is a chronic problem. The current episode started more than 1 year ago. The problem occurs  intermittently. The problem has been waxing and waning since onset. The pain is present in the lumbar spine. The quality of the pain is described as aching. The pain is at a severity of 8/10. The pain is moderate. Risk factors include obesity. He has tried analgesics for the symptoms. The treatment provided mild relief.  Gastroesophageal Reflux He complains of belching and heartburn. This is a chronic problem. The current episode started more than 1 year ago. The problem occurs occasionally. Risk factors include obesity. He has tried a PPI for the symptoms. The treatment provided moderate relief.   Current opioids rx- Norco 5-325 mg # meds rx- 90 Effectiveness of current meds-not helping with pain at this time Adverse reactions from pain meds-None Morphine equivalent- 20   Pill count performed-No Last drug screen - 04/07/23 ( high risk q10m, moderate risk q42m, low risk yearly ) Urine drug screen today- Yes Was the NCCSR reviewed- Yes             If yes were their any concerning findings? none   Pain contract signed on: 04/07/23    Review of Systems  Constitutional:  Positive for malaise/fatigue.  Eyes:  Positive for blurred vision.  Respiratory:  Positive for shortness of breath.   Gastrointestinal:  Positive for heartburn.  Musculoskeletal:  Positive for back pain.  All other systems reviewed and are negative.      Objective:   Physical Exam Vitals reviewed.  Constitutional:      General: He is not in acute distress.    Appearance: He is well-developed. He is obese.  HENT:     Head: Normocephalic.     Right Ear: There is impacted cerumen.     Left Ear: Tympanic membrane normal.  Eyes:     General:        Right eye: No discharge.        Left eye: No discharge.     Pupils: Pupils are equal, round, and reactive to light.  Neck:     Thyroid: No thyromegaly.  Cardiovascular:     Rate and Rhythm: Normal rate and regular rhythm.     Heart sounds: Normal heart sounds. No  murmur heard. Pulmonary:     Effort: Pulmonary effort is normal. No respiratory distress.     Breath sounds: Normal breath sounds. No wheezing.  Abdominal:     General: Bowel sounds are normal. There is no distension.     Palpations: Abdomen is soft.     Tenderness: There is no abdominal tenderness (no tenderness noted on exam).  Musculoskeletal:        General: No tenderness.     Cervical back: Normal range of motion and neck supple.     Right lower leg: Edema (trace) present.     Left lower leg: Edema (trace) present.     Comments: Pain in lumbar with flexion and extension  Skin:    General: Skin is warm and dry.     Findings: No erythema or rash.  Neurological:     Mental Status: He is alert and oriented to person, place, and time.     Cranial Nerves: No cranial nerve deficit.     Motor: Weakness (walking with cane) present.     Gait: Gait abnormal.     Deep Tendon Reflexes: Reflexes are normal and symmetric.  Psychiatric:        Behavior: Behavior normal.        Thought Content: Thought content normal.        Judgment: Judgment normal.       BP 125/71   Pulse 64   Temp 97.9 F (36.6 C) (Temporal)   Ht 5\' 7"  (1.702 m)   Wt 220 lb (99.8 kg)   SpO2 96%   BMI 34.46 kg/m      Assessment & Plan:   Daniel Short comes in today with chief complaint of Medical Management of Chronic Issues   Diagnosis and orders addressed:  1. Annual physical exam (Primary) - Bayer DCA Hb A1c Waived - CBC with Differential/Platelet - Vitamin B12 - TSH  2. Coronary artery disease involving native coronary artery of native heart without angina pectoris - Bayer DCA Hb A1c Waived - CBC with Differential/Platelet  3. Hyperlipidemia associated with type 2 diabetes mellitus (HCC) - Bayer DCA Hb A1c Waived - CBC with Differential/Platelet  4. Hypertension associated with diabetes (HCC) - Bayer DCA Hb A1c Waived - CBC with Differential/Platelet  5. Chronic low back pain,  unspecified back pain laterality, unspecified whether sciatica present - HYDROcodone-acetaminophen (NORCO) 7.5-325 MG tablet; Take 1 tablet by mouth every 8 (eight) hours as needed for moderate pain (pain score 4-6).  Dispense: 90 tablet; Refill: 0 - HYDROcodone-acetaminophen (NORCO) 7.5-325 MG tablet; Take 1 tablet by mouth every 8 (eight) hours as needed for moderate pain (pain score 4-6).  Dispense: 90 tablet; Refill: 0 - HYDROcodone-acetaminophen (NORCO) 7.5-325 MG tablet; Take 1 tablet by mouth every 8 (eight) hours as needed for moderate pain (pain score 4-6).  Dispense: 90 tablet; Refill: 0 - Bayer DCA Hb A1c Waived - CBC with Differential/Platelet  6. Type 2 diabetes mellitus with other specified complication, without long-term current use of insulin (HCC) - CMP14+EGFR - Bayer DCA Hb A1c  Waived - CBC with Differential/Platelet - Vitamin B12 - TSH  7. Acute gout involving toe of right foot, unspecified cause - Bayer DCA Hb A1c Waived - CBC with Differential/Platelet  8. Atrial fibrillation with RVR (HCC)  - Bayer DCA Hb A1c Waived - CBC with Differential/Platelet  9. Obesity (BMI 30-39.9) - Bayer DCA Hb A1c Waived - CBC with Differential/Platelet  10. Controlled substance agreement signed  - HYDROcodone-acetaminophen (NORCO) 7.5-325 MG tablet; Take 1 tablet by mouth every 8 (eight) hours as needed for moderate pain (pain score 4-6).  Dispense: 90 tablet; Refill: 0 - HYDROcodone-acetaminophen (NORCO) 7.5-325 MG tablet; Take 1 tablet by mouth every 8 (eight) hours as needed for moderate pain (pain score 4-6).  Dispense: 90 tablet; Refill: 0 - HYDROcodone-acetaminophen (NORCO) 7.5-325 MG tablet; Take 1 tablet by mouth every 8 (eight) hours as needed for moderate pain (pain score 4-6).  Dispense: 90 tablet; Refill: 0 - Bayer DCA Hb A1c Waived - CBC with Differential/Platelet  11. Gastroesophageal reflux disease without esophagitis - Bayer DCA Hb A1c Waived - CBC with  Differential/Platelet  Labs pending Patient reviewed in [place] controlled database, no flags noted. Contract and drug screen are up to date.  Will increase Norco 7.5-35 mg from 5-325 mg Continue current medications  Keep specialists follow up Health Maintenance reviewed Diet and exercise encouraged  Follow up plan: 3 months    Jannifer Rodney, FNP

## 2023-07-08 LAB — CBC WITH DIFFERENTIAL/PLATELET
Basophils Absolute: 0.1 10*3/uL (ref 0.0–0.2)
Basos: 1 %
EOS (ABSOLUTE): 0.2 10*3/uL (ref 0.0–0.4)
Eos: 2 %
Hematocrit: 42.9 % (ref 37.5–51.0)
Hemoglobin: 14 g/dL (ref 13.0–17.7)
Immature Grans (Abs): 0 10*3/uL (ref 0.0–0.1)
Immature Granulocytes: 0 %
Lymphocytes Absolute: 3.2 10*3/uL — ABNORMAL HIGH (ref 0.7–3.1)
Lymphs: 32 %
MCH: 30.8 pg (ref 26.6–33.0)
MCHC: 32.6 g/dL (ref 31.5–35.7)
MCV: 95 fL (ref 79–97)
Monocytes Absolute: 1.1 10*3/uL — ABNORMAL HIGH (ref 0.1–0.9)
Monocytes: 11 %
Neutrophils Absolute: 5.3 10*3/uL (ref 1.4–7.0)
Neutrophils: 54 %
Platelets: 243 10*3/uL (ref 150–450)
RBC: 4.54 x10E6/uL (ref 4.14–5.80)
RDW: 12.7 % (ref 11.6–15.4)
WBC: 9.8 10*3/uL (ref 3.4–10.8)

## 2023-07-08 LAB — CMP14+EGFR
ALT: 23 IU/L (ref 0–44)
AST: 29 IU/L (ref 0–40)
Albumin: 4.2 g/dL (ref 3.8–4.8)
Alkaline Phosphatase: 77 IU/L (ref 44–121)
BUN/Creatinine Ratio: 7 — ABNORMAL LOW (ref 10–24)
BUN: 8 mg/dL (ref 8–27)
Bilirubin Total: 1.5 mg/dL — ABNORMAL HIGH (ref 0.0–1.2)
CO2: 21 mmol/L (ref 20–29)
Calcium: 9.3 mg/dL (ref 8.6–10.2)
Chloride: 104 mmol/L (ref 96–106)
Creatinine, Ser: 1.2 mg/dL (ref 0.76–1.27)
Globulin, Total: 2.2 g/dL (ref 1.5–4.5)
Glucose: 88 mg/dL (ref 70–99)
Potassium: 4.4 mmol/L (ref 3.5–5.2)
Sodium: 142 mmol/L (ref 134–144)
Total Protein: 6.4 g/dL (ref 6.0–8.5)
eGFR: 62 mL/min/{1.73_m2} (ref >59)

## 2023-07-08 LAB — TSH: TSH: 3.03 u[IU]/mL (ref 0.450–4.500)

## 2023-07-08 LAB — VITAMIN B12: Vitamin B-12: 212 pg/mL — ABNORMAL LOW (ref 232–1245)

## 2023-07-10 ENCOUNTER — Other Ambulatory Visit: Payer: Self-pay | Admitting: Family

## 2023-07-10 DIAGNOSIS — E538 Deficiency of other specified B group vitamins: Secondary | ICD-10-CM | POA: Insufficient documentation

## 2023-07-17 ENCOUNTER — Ambulatory Visit (INDEPENDENT_AMBULATORY_CARE_PROVIDER_SITE_OTHER)

## 2023-07-17 DIAGNOSIS — E538 Deficiency of other specified B group vitamins: Secondary | ICD-10-CM | POA: Diagnosis not present

## 2023-07-17 MED ORDER — CYANOCOBALAMIN 1000 MCG/ML IJ SOLN
1000.0000 ug | INTRAMUSCULAR | Status: AC
Start: 2023-07-17 — End: ?
  Administered 2023-07-17 – 2023-09-12 (×5): 1000 ug via INTRAMUSCULAR

## 2023-07-17 NOTE — Progress Notes (Signed)
 Patient is in office today for a nurse visit for B12 Injection. Patient Injection was given in the  Right deltoid. Patient tolerated injection well.

## 2023-07-18 ENCOUNTER — Ambulatory Visit

## 2023-07-19 ENCOUNTER — Ambulatory Visit

## 2023-07-20 ENCOUNTER — Ambulatory Visit

## 2023-07-21 ENCOUNTER — Ambulatory Visit

## 2023-07-24 ENCOUNTER — Other Ambulatory Visit: Payer: Self-pay | Admitting: Family

## 2023-07-24 ENCOUNTER — Ambulatory Visit: Payer: Medicare HMO | Attending: Cardiology | Admitting: Cardiology

## 2023-07-24 ENCOUNTER — Encounter: Payer: Self-pay | Admitting: Cardiology

## 2023-07-24 VITALS — BP 124/62 | HR 74 | Ht 67.0 in | Wt 221.2 lb

## 2023-07-24 DIAGNOSIS — I10 Essential (primary) hypertension: Secondary | ICD-10-CM | POA: Diagnosis not present

## 2023-07-24 DIAGNOSIS — I480 Paroxysmal atrial fibrillation: Secondary | ICD-10-CM | POA: Diagnosis not present

## 2023-07-24 DIAGNOSIS — E1169 Type 2 diabetes mellitus with other specified complication: Secondary | ICD-10-CM

## 2023-07-24 DIAGNOSIS — E782 Mixed hyperlipidemia: Secondary | ICD-10-CM | POA: Diagnosis not present

## 2023-07-24 DIAGNOSIS — I25119 Atherosclerotic heart disease of native coronary artery with unspecified angina pectoris: Secondary | ICD-10-CM

## 2023-07-24 NOTE — Progress Notes (Signed)
    Cardiology Office Note  Date: 07/24/2023   ID: Daniel Short, DOB [birth year], MRN [id number]  History of Present Illness: Daniel Short is a 78 y.o. male last seen in July 2024.  He is here today with his wife for a follow-up visit.  He does not report any recurring chest discomfort, no sense of palpitations, no dizziness or syncope.  Main complaint is of chronic knee pain, he uses a cane to ambulate.  We went over his medications.  He does not report any major change in cardiac regimen, no obvious intolerances.  LDL was 63 by lab work in January.  Physical Exam: VS:  BP 124/62   Pulse 74   Ht 5\' 7"  (1.702 m)   Wt 221 lb 3.2 oz (100.3 kg)   SpO2 97%   BMI 34.64 kg/m , BMI Body mass index is 34.64 kg/m.  Wt Readings from Last 3 Encounters:  07/24/23 221 lb 3.2 oz (100.3 kg)  07/07/23 220 lb (99.8 kg)  04/07/23 219 lb 9.6 oz (99.6 kg)    General: Patient appears comfortable at rest. HEENT: Conjunctiva and lids normal. Neck: Supple, no elevated JVP or carotid bruits. Lungs: Clear to auscultation, nonlabored breathing at rest. Cardiac: Regular rate and rhythm, no S3 or significant systolic murmur.  ECG:  An ECG dated 10/17/2022 was personally reviewed today and demonstrated:  Sinus rhythm with prolonged PR interval, left anterior fascicular block, incomplete right bundle branch block, PACs.  Labwork: 07/07/2023: ALT 23; AST 29; BUN 8; Creatinine, Ser 1.20; Hemoglobin 14.0; Platelets 243; Potassium 4.4; Sodium 142; TSH 3.030     Component Value Date/Time   CHOL 124 04/07/2023 1153   TRIG 146 04/07/2023 1153   HDL 36 (L) 04/07/2023 1153   CHOLHDL 3.4 04/07/2023 1153   CHOLHDL 3.8 05/28/2020 0423   VLDL 24 05/28/2020 0423   LDLCALC 63 04/07/2023 1153   LDLDIRECT 82 05/19/2016 1616   Other Studies Reviewed Today:  No interval cardiac testing for review today.  Assessment and Plan:  1.  CAD status post BMS to the ostial/proximal RCA in 2003.  LVEF approximately 50%  by echocardiogram in March 2022.  He does not report any angina and continues on medical therapy.  Continue aspirin  81 mg daily and Lipitor 20 mg daily.   2.  Paroxysmal atrial fibrillation with CHA2DS2-VASc score of 5.  He is not anticoagulated with prior history of subarachnoid hemorrhage and hemorrhagic stroke.  He has declined Watchman device consultation as well.   3.  Mixed hyperlipidemia.  LDL 63 in January.  Continue Lipitor 20 mg daily.  4.  Primary hypertension, no changes made to current regimen including lisinopril  20 mg daily and Lopressor  25 mg in the morning and 12.5 mg in the evening.  Disposition:  Follow up  6 months.  Signed, Gerard Knight, M.D., F.A.C.C. [facility]

## 2023-07-24 NOTE — Patient Instructions (Signed)

## 2023-07-25 ENCOUNTER — Ambulatory Visit (INDEPENDENT_AMBULATORY_CARE_PROVIDER_SITE_OTHER)

## 2023-07-25 DIAGNOSIS — E538 Deficiency of other specified B group vitamins: Secondary | ICD-10-CM

## 2023-07-25 NOTE — Progress Notes (Signed)
 Patient is in office today for a nurse visit for B12 Injection. Patient Injection was given in the  Left deltoid. Patient tolerated injection well.

## 2023-08-01 ENCOUNTER — Ambulatory Visit (INDEPENDENT_AMBULATORY_CARE_PROVIDER_SITE_OTHER)

## 2023-08-01 DIAGNOSIS — E538 Deficiency of other specified B group vitamins: Secondary | ICD-10-CM

## 2023-08-01 NOTE — Progress Notes (Signed)
 Patient is in office today for a nurse visit for B12 Injection. Patient Injection was given in the  Right deltoid. Patient tolerated injection well.

## 2023-08-08 ENCOUNTER — Ambulatory Visit (INDEPENDENT_AMBULATORY_CARE_PROVIDER_SITE_OTHER)

## 2023-08-08 DIAGNOSIS — E538 Deficiency of other specified B group vitamins: Secondary | ICD-10-CM

## 2023-08-08 NOTE — Progress Notes (Signed)
 Patient is in office today for a nurse visit for B12 Injection. Patient Injection was given in the  Left deltoid. Patient tolerated injection well.

## 2023-09-12 ENCOUNTER — Other Ambulatory Visit

## 2023-09-12 ENCOUNTER — Ambulatory Visit (INDEPENDENT_AMBULATORY_CARE_PROVIDER_SITE_OTHER)

## 2023-09-12 DIAGNOSIS — E538 Deficiency of other specified B group vitamins: Secondary | ICD-10-CM | POA: Diagnosis not present

## 2023-09-12 NOTE — Progress Notes (Signed)
 Patient is in office today for a nurse visit for B12 Injection. Patient Injection was given in the  Right deltoid. Patient tolerated injection well.

## 2023-09-13 LAB — VITAMIN B12: Vitamin B-12: 439 pg/mL (ref 232–1245)

## 2023-09-18 ENCOUNTER — Ambulatory Visit: Payer: Self-pay | Admitting: Family

## 2023-09-18 NOTE — Telephone Encounter (Signed)
 Copied from CRM [id number]. Topic: Clinical - Lab/Test Results >> Sep 18, 2023  2:25 PM Avram MATSU wrote: Reason for CRM: I informed the wife his B12 are normal

## 2023-09-28 ENCOUNTER — Ambulatory Visit: Payer: Self-pay | Admitting: Family

## 2023-09-28 ENCOUNTER — Other Ambulatory Visit: Payer: Self-pay | Admitting: Family

## 2023-09-28 ENCOUNTER — Ambulatory Visit (INDEPENDENT_AMBULATORY_CARE_PROVIDER_SITE_OTHER)

## 2023-09-28 ENCOUNTER — Encounter: Payer: Self-pay | Admitting: Family

## 2023-09-28 ENCOUNTER — Ambulatory Visit: Admitting: Family

## 2023-09-28 VITALS — BP 128/76 | HR 59 | Temp 97.0°F | Wt 223.0 lb

## 2023-09-28 DIAGNOSIS — I480 Paroxysmal atrial fibrillation: Secondary | ICD-10-CM | POA: Diagnosis not present

## 2023-09-28 DIAGNOSIS — M25552 Pain in left hip: Secondary | ICD-10-CM

## 2023-09-28 DIAGNOSIS — K219 Gastro-esophageal reflux disease without esophagitis: Secondary | ICD-10-CM | POA: Diagnosis not present

## 2023-09-28 DIAGNOSIS — E785 Hyperlipidemia, unspecified: Secondary | ICD-10-CM

## 2023-09-28 DIAGNOSIS — E538 Deficiency of other specified B group vitamins: Secondary | ICD-10-CM

## 2023-09-28 DIAGNOSIS — Z87891 Personal history of nicotine dependence: Secondary | ICD-10-CM | POA: Diagnosis not present

## 2023-09-28 DIAGNOSIS — I2510 Atherosclerotic heart disease of native coronary artery without angina pectoris: Secondary | ICD-10-CM

## 2023-09-28 DIAGNOSIS — Z79899 Other long term (current) drug therapy: Secondary | ICD-10-CM

## 2023-09-28 DIAGNOSIS — E669 Obesity, unspecified: Secondary | ICD-10-CM

## 2023-09-28 DIAGNOSIS — E1169 Type 2 diabetes mellitus with other specified complication: Secondary | ICD-10-CM | POA: Diagnosis not present

## 2023-09-28 DIAGNOSIS — E1159 Type 2 diabetes mellitus with other circulatory complications: Secondary | ICD-10-CM | POA: Diagnosis not present

## 2023-09-28 DIAGNOSIS — M545 Low back pain, unspecified: Secondary | ICD-10-CM | POA: Diagnosis not present

## 2023-09-28 DIAGNOSIS — I152 Hypertension secondary to endocrine disorders: Secondary | ICD-10-CM

## 2023-09-28 DIAGNOSIS — G8929 Other chronic pain: Secondary | ICD-10-CM

## 2023-09-28 DIAGNOSIS — I700 Atherosclerosis of aorta: Secondary | ICD-10-CM | POA: Insufficient documentation

## 2023-09-28 MED ORDER — HYDROCODONE-ACETAMINOPHEN 7.5-325 MG PO TABS: 1.0000 | ORAL_TABLET | Freq: Three times a day (TID) | ORAL | 0 refills | Status: DC | PRN

## 2023-09-28 NOTE — Progress Notes (Signed)
 Subjective:    Patient ID: Daniel Short, male    DOB: 1945/08/19, 78 y.o.   MRN: [id number]  Chief Complaint  Patient presents with   Back Pain    Wants xrays goes into the front of left hip    PT presents to the office today for chronic follow up.   Pt is followed by Cardiologists every 6 months for CAD, A Fib, and hx of MI.   He has hx of gout and takes allopurinol  100 mg daily. Does not remember last flare up.  Hypertension This is a chronic problem. The current episode started more than 1 year ago. The problem has been resolved since onset. The problem is controlled. Associated symptoms include malaise/fatigue and shortness of breath (some times). Pertinent negatives include no blurred vision or peripheral edema. Risk factors for coronary artery disease include obesity, male gender, dyslipidemia and sedentary lifestyle. The current treatment provides moderate improvement.  Hyperlipidemia This is a chronic problem. The current episode started more than 1 year ago. The problem is controlled. Recent lipid tests were reviewed and are normal. Exacerbating diseases include obesity. Associated symptoms include shortness of breath (some times). Current antihyperlipidemic treatment includes statins. The current treatment provides moderate improvement of lipids. Risk factors for coronary artery disease include dyslipidemia, hypertension, male sex and a sedentary lifestyle.  Diabetes He presents for his follow-up diabetic visit. He has type 2 diabetes mellitus. Associated symptoms include foot paresthesias. Pertinent negatives for diabetes include no blurred vision. Symptoms are stable. Diabetic complications include peripheral neuropathy. Risk factors for coronary artery disease include dyslipidemia, diabetes mellitus, hypertension, male sex and sedentary lifestyle. He is following a generally unhealthy diet. (Does not check glucose at home) Eye exam is current.  Back Pain This is a chronic  problem. The current episode started more than 1 year ago. The problem occurs intermittently. The problem has been waxing and waning since onset. The pain is present in the lumbar spine. The quality of the pain is described as aching. The pain is at a severity of 5/10. The pain is moderate. Risk factors include obesity. He has tried analgesics for the symptoms. The treatment provided mild relief.  Gastroesophageal Reflux He complains of belching and heartburn. This is a chronic problem. The current episode started more than 1 year ago. The problem occurs occasionally. The symptoms are aggravated by certain foods. Risk factors include obesity. He has tried a PPI for the symptoms. The treatment provided moderate relief.  Hip Pain  The incident occurred more than 1 week ago. There was no injury mechanism. The pain is present in the left hip. The quality of the pain is described as aching. The pain is at a severity of 4/10. The pain is moderate. The pain has been Intermittent since onset. He reports no foreign bodies present. The symptoms are aggravated by movement. He has tried acetaminophen  for the symptoms. The treatment provided mild relief.   Current opioids rx- Norco 5-325 mg # meds rx- 90 Effectiveness of current meds-not helping with pain at this time Adverse reactions from pain meds-None Morphine  equivalent- 20   Pill count performed-No Last drug screen - 04/07/23 ( high risk q9m, moderate risk q45m, low risk yearly ) Urine drug screen today- Yes Was the NCCSR reviewed- Yes             If yes were their any concerning findings? none   Pain contract signed on: 04/07/23    Review of Systems  Constitutional:  Positive  for malaise/fatigue.  Eyes:  Negative for blurred vision.  Respiratory:  Positive for shortness of breath (some times).   Gastrointestinal:  Positive for heartburn.  Musculoskeletal:  Positive for back pain.  All other systems reviewed and are negative.      Objective:    Physical Exam Vitals reviewed.  Constitutional:      General: He is not in acute distress.    Appearance: He is well-developed. He is obese.  HENT:     Head: Normocephalic.     Right Ear: Tympanic membrane normal.     Left Ear: Tympanic membrane normal.  Eyes:     General:        Right eye: No discharge.        Left eye: No discharge.     Pupils: Pupils are equal, round, and reactive to light.  Neck:     Thyroid : No thyromegaly.  Cardiovascular:     Rate and Rhythm: Normal rate and regular rhythm.     Heart sounds: Normal heart sounds. No murmur heard. Pulmonary:     Effort: Pulmonary effort is normal. No respiratory distress.     Breath sounds: Normal breath sounds. No wheezing.  Abdominal:     General: Bowel sounds are normal. There is no distension.     Palpations: Abdomen is soft.     Tenderness: There is abdominal tenderness (mild epigastric).  Musculoskeletal:        General: No tenderness.     Cervical back: Normal range of motion and neck supple.     Right lower leg: Edema (trace) present.     Left lower leg: Edema (trace) present.     Comments: Pain in lumbar with flexion and extension  Skin:    General: Skin is warm and dry.     Findings: No erythema or rash.  Neurological:     Mental Status: He is alert and oriented to person, place, and time.     Cranial Nerves: No cranial nerve deficit.     Motor: Weakness (walking with cane) present.     Gait: Gait abnormal.     Deep Tendon Reflexes: Reflexes are normal and symmetric.  Psychiatric:        Behavior: Behavior normal.        Thought Content: Thought content normal.        Judgment: Judgment normal.      BP 128/76   Pulse (!) 59   Temp (!) 97 F (36.1 C)   Wt 223 lb (101.2 kg)   SpO2 98%   BMI 34.93 kg/m      Assessment & Plan:   Daniel Short comes in today with chief complaint of Back Pain (Wants xrays goes into the front of left hip/)   Diagnosis and orders addressed:  1. Type 2  diabetes mellitus with other specified complication, without long-term current use of insulin  (HCC) (Primary) -stable   2. Gastroesophageal reflux disease without esophagitis   3. Controlled substance agreement signed - HYDROcodone -acetaminophen  (NORCO) 7.5-325 MG tablet; Take 1 tablet by mouth every 8 (eight) hours as needed for moderate pain (pain score 4-6).  Dispense: 90 tablet; Refill: 0 - HYDROcodone -acetaminophen  (NORCO) 7.5-325 MG tablet; Take 1 tablet by mouth every 8 (eight) hours as needed for moderate pain (pain score 4-6).  Dispense: 90 tablet; Refill: 0 - HYDROcodone -acetaminophen  (NORCO) 7.5-325 MG tablet; Take 1 tablet by mouth every 8 (eight) hours as needed for moderate pain (pain score 4-6).  Dispense: 90 tablet;  Refill: 0  4. Paroxysmal atrial fibrillation (HCC)  5. Obesity (BMI 30-39.9)  6. Chronic low back pain, unspecified back pain laterality, unspecified whether sciatica present - HYDROcodone -acetaminophen  (NORCO) 7.5-325 MG tablet; Take 1 tablet by mouth every 8 (eight) hours as needed for moderate pain (pain score 4-6).  Dispense: 90 tablet; Refill: 0 - HYDROcodone -acetaminophen  (NORCO) 7.5-325 MG tablet; Take 1 tablet by mouth every 8 (eight) hours as needed for moderate pain (pain score 4-6).  Dispense: 90 tablet; Refill: 0 - HYDROcodone -acetaminophen  (NORCO) 7.5-325 MG tablet; Take 1 tablet by mouth every 8 (eight) hours as needed for moderate pain (pain score 4-6).  Dispense: 90 tablet; Refill: 0  7. Hypertension associated with diabetes (HCC)   8. History of tobacco abuse  9. Hyperlipidemia associated with type 2 diabetes mellitus (HCC)  10. Coronary artery disease involving native coronary artery of native heart without angina pectoris  11. Vitamin B 12 deficiency   Labs pending Patient reviewed in [place] controlled database, no flags noted. Contract and drug screen are up to date.  Continue current medications  Keep specialists follow up Health  Maintenance reviewed Diet and exercise encouraged  Follow up plan: 3 months    Bari Learn, FNP

## 2023-09-28 NOTE — Patient Instructions (Signed)
Hip Bursitis  Hip bursitis is the swelling of one or more of the fluid-filled sacs (bursae) in the hip joint. The hip bursae absorb shocks and prevent bones from rubbing against each other. If a bursa becomes irritated, it can fill with extra fluid and become inflamed. Hip bursitis can cause mild to moderate pain, and symptoms often come and go over time. What are the causes? This condition results from increased friction between the hip bones and the tendons around the hip joint. This condition can happen if you: Overuse your hip muscles. Injure your hip. Have weak buttocks muscles. Have bone spurs. Have an infection. In some cases, the cause may not be known. What increases the risk? You are more likely to develop this condition if: You injured your hip previously or had hip surgery. You have a medical condition, such as arthritis, gout, diabetes, or thyroid disease. You have spine problems. You have one leg that is shorter than the other. You participate in athletic activities that include repetitive motion, like running. You participate in sports where there is a risk of injury or falling, such as football, martial arts, or skiing. What are the signs or symptoms? Symptoms may come and go, and they often include: Pain in the hip or groin area. Pain may get worse with movement. Tenderness and swelling of the hip. In rare cases, the bursa may become infected. If this happens, you may get a fever, as well as warmth and redness in the hip area. How is this diagnosed? This condition may be diagnosed based on: Your symptoms. Your medical history. A physical exam. Imaging tests, such as: X-rays to check your bones. MRI or ultrasound to check your tendons and muscles. Bone scan. How is this treated? This condition is treated by resting, icing, applying pressure (compression), and raising (elevating) the injured area. This is called RICE treatment. In some cases, RICE treatment may not  be enough to make your symptoms go away. Treatment may also include: Using crutches, a cane, or a walker to decrease the strain on your hip. Taking medicine to help with swelling and pain. Getting a shot of cortisone medicine near the affected area to reduce swelling and pain. Taking antibiotic medicines if there is an infection. Draining fluid out of the bursa to help relieve swelling and pain. Having surgery to remove a damaged or infected bursa. This is rare. Long-term treatment may include: Physical therapy exercises for strength and flexibility. Identifying the cause of your bursitis to prevent future episodes. Lifestyle changes, such as weight loss, to reduce the strain on the hip. Follow these instructions at home: Managing pain, stiffness, and swelling     If directed, put ice on the affected area. To do this: Put ice in a plastic bag. Place a towel between your skin and the bag. Leave the ice on for 20 minutes, 2-3 times a day. Remove the ice if your skin turns bright red. This is very important. If you cannot feel pain, heat, or cold, you have a greater risk of damage to the area. Elevate your hip as much as you can without feeling pain. To do this, put a pillow under your hips while you lie down. If directed, apply heat to the affected area as often as told by your health care provider. Use the heat source that your health care provider recommends, such as a moist heat pack or a heating pad. Place a towel between your skin and the heat source. Leave the heat   on for 20-30 minutes. Remove the heat if your skin turns bright red. This is especially important if you are unable to feel pain, heat, or cold. You may have a greater risk of getting burned. Activity Do not use your hip to support your body weight until your health care provider says that you can. Use crutches, a cane, or a walker as told by your health care provider. If the affected leg is one that you use to drive, ask  your health care provider if it is safe to drive. Rest and protect your hip as much as possible until your pain and swelling get better. Return to your normal activities as told by your health care provider. Ask your health care provider what activities are safe for you. Do exercises as told by your health care provider. General instructions Take over-the-counter and prescription medicines only as told by your health care provider. Gently massage and stretch your injured area as often as is comfortable. Wear compression wraps only as told by your health care provider. If one of your legs is shorter than the other, get fitted for a shoe insert or orthotic. Your health care provider or physical therapist can tell you where to find these items and what size you need. Maintain a healthy weight. Follow instructions from your health care provider for weight control. These may include dietary restrictions. Keep all follow-up visits. This is important. How is this prevented? Exercise regularly or as told by your health care provider. Wear supportive footwear that is appropriate for your sport and daily activities. Warm up and stretch before being active. Cool down and stretch after being active. Take breaks regularly from repetitive activity. If an activity irritates your hip or causes pain, avoid the activity as much as possible. Avoid sitting down for long periods at a time. Where to find more information [employer]: orthoinfo.aaos.org Contact a health care provider if: You have a fever. You develop new symptoms. You have trouble walking or doing everyday activities. You have pain that gets worse or does not get better with medicine. You develop red skin or a feeling of warmth in your hip area. Get help right away if: You cannot move your hip. You have severe pain. You cannot control the muscles in your feet. Summary Hip bursitis is the swelling of one or more  of the fluid-filled sacs (bursae) in the hip joint. Hip bursitis can cause hip or groin pain, and symptoms often come and go over time. This condition is often treated by resting, icing, applying pressure (compression), and raising (elevating) the injured area. Other treatments may be needed. This information is not intended to replace advice given to you by your health care provider. Make sure you discuss any questions you have with your health care provider. Document Revised: 03/09/2021 Document Reviewed: 03/09/2021 Elsevier Patient Education  2024 [employer].

## 2023-10-06 ENCOUNTER — Ambulatory Visit: Admitting: Family

## 2023-10-07 ENCOUNTER — Other Ambulatory Visit: Payer: Self-pay | Admitting: Family

## 2023-10-07 DIAGNOSIS — K219 Gastro-esophageal reflux disease without esophagitis: Secondary | ICD-10-CM

## 2023-10-07 DIAGNOSIS — E1169 Type 2 diabetes mellitus with other specified complication: Secondary | ICD-10-CM

## 2023-10-07 DIAGNOSIS — I152 Hypertension secondary to endocrine disorders: Secondary | ICD-10-CM

## 2023-10-07 DIAGNOSIS — R1011 Right upper quadrant pain: Secondary | ICD-10-CM

## 2023-11-28 ENCOUNTER — Other Ambulatory Visit: Payer: Self-pay | Admitting: Family

## 2023-11-28 DIAGNOSIS — J301 Allergic rhinitis due to pollen: Secondary | ICD-10-CM

## 2023-12-06 ENCOUNTER — Telehealth: Payer: Self-pay | Admitting: Family

## 2023-12-06 NOTE — Telephone Encounter (Signed)
 Will route to PCP to address this.    Copied from CRM [id number]. Topic: Clinical - Medication Question >> Dec 06, 2023 12:43 PM Sophia H wrote: Reason for CRM: Patients wife is calling in on behalf of the patient, states the loratadine  (CLARITIN ) 10 MG tablet is not doing anything for him. States he wakes up with a bad runny nose and is wondering if anything else can be sent in or if there is any other medical advice that can be given. # [phone]

## 2023-12-07 MED ORDER — LEVOCETIRIZINE DIHYDROCHLORIDE 5 MG PO TABS: 5.0000 mg | ORAL_TABLET | Freq: Every evening | ORAL | 1 refills | Status: AC

## 2023-12-07 NOTE — Telephone Encounter (Signed)
 Patient wife aware

## 2023-12-07 NOTE — Telephone Encounter (Signed)
 Claritin  changed to xyzal .

## 2023-12-18 DIAGNOSIS — H2513 Age-related nuclear cataract, bilateral: Secondary | ICD-10-CM | POA: Diagnosis not present

## 2023-12-18 LAB — OPHTHALMOLOGY REPORT-SCANNED

## 2023-12-28 DIAGNOSIS — I10 Essential (primary) hypertension: Secondary | ICD-10-CM | POA: Diagnosis not present

## 2023-12-28 DIAGNOSIS — H2511 Age-related nuclear cataract, right eye: Secondary | ICD-10-CM | POA: Diagnosis not present

## 2023-12-28 DIAGNOSIS — H25811 Combined forms of age-related cataract, right eye: Secondary | ICD-10-CM | POA: Diagnosis not present

## 2023-12-28 DIAGNOSIS — E1136 Type 2 diabetes mellitus with diabetic cataract: Secondary | ICD-10-CM | POA: Diagnosis not present

## 2023-12-28 DIAGNOSIS — I2510 Atherosclerotic heart disease of native coronary artery without angina pectoris: Secondary | ICD-10-CM | POA: Diagnosis not present

## 2023-12-30 ENCOUNTER — Other Ambulatory Visit: Payer: Self-pay | Admitting: Family

## 2023-12-30 DIAGNOSIS — E1159 Type 2 diabetes mellitus with other circulatory complications: Secondary | ICD-10-CM

## 2023-12-30 DIAGNOSIS — K219 Gastro-esophageal reflux disease without esophagitis: Secondary | ICD-10-CM

## 2023-12-30 DIAGNOSIS — R1011 Right upper quadrant pain: Secondary | ICD-10-CM

## 2024-01-02 ENCOUNTER — Encounter: Payer: Self-pay | Admitting: Family

## 2024-01-02 ENCOUNTER — Ambulatory Visit: Admitting: Family

## 2024-01-02 VITALS — BP 128/59 | HR 64 | Temp 97.5°F | Ht 67.0 in | Wt 218.4 lb

## 2024-01-02 DIAGNOSIS — E669 Obesity, unspecified: Secondary | ICD-10-CM

## 2024-01-02 DIAGNOSIS — Z79899 Other long term (current) drug therapy: Secondary | ICD-10-CM

## 2024-01-02 DIAGNOSIS — K219 Gastro-esophageal reflux disease without esophagitis: Secondary | ICD-10-CM

## 2024-01-02 DIAGNOSIS — I700 Atherosclerosis of aorta: Secondary | ICD-10-CM

## 2024-01-02 DIAGNOSIS — E538 Deficiency of other specified B group vitamins: Secondary | ICD-10-CM

## 2024-01-02 DIAGNOSIS — R040 Epistaxis: Secondary | ICD-10-CM

## 2024-01-02 DIAGNOSIS — G8929 Other chronic pain: Secondary | ICD-10-CM | POA: Diagnosis not present

## 2024-01-02 DIAGNOSIS — M545 Low back pain, unspecified: Secondary | ICD-10-CM

## 2024-01-02 DIAGNOSIS — E785 Hyperlipidemia, unspecified: Secondary | ICD-10-CM | POA: Diagnosis not present

## 2024-01-02 DIAGNOSIS — E1159 Type 2 diabetes mellitus with other circulatory complications: Secondary | ICD-10-CM | POA: Diagnosis not present

## 2024-01-02 DIAGNOSIS — E1169 Type 2 diabetes mellitus with other specified complication: Secondary | ICD-10-CM

## 2024-01-02 DIAGNOSIS — I4891 Unspecified atrial fibrillation: Secondary | ICD-10-CM

## 2024-01-02 DIAGNOSIS — I480 Paroxysmal atrial fibrillation: Secondary | ICD-10-CM

## 2024-01-02 DIAGNOSIS — I152 Hypertension secondary to endocrine disorders: Secondary | ICD-10-CM | POA: Diagnosis not present

## 2024-01-02 DIAGNOSIS — M150 Primary generalized (osteo)arthritis: Secondary | ICD-10-CM

## 2024-01-02 DIAGNOSIS — I2510 Atherosclerotic heart disease of native coronary artery without angina pectoris: Secondary | ICD-10-CM

## 2024-01-02 LAB — BAYER DCA HB A1C WAIVED: HB A1C (BAYER DCA - WAIVED): 6 % — ABNORMAL HIGH (ref 4.8–5.6)

## 2024-01-02 MED ORDER — METOPROLOL TARTRATE 25 MG PO TABS: ORAL_TABLET | ORAL | 0 refills | Status: DC

## 2024-01-02 MED ORDER — HYDROCODONE-ACETAMINOPHEN 7.5-325 MG PO TABS: 1.0000 | ORAL_TABLET | Freq: Three times a day (TID) | ORAL | 0 refills | Status: DC | PRN

## 2024-01-02 MED ORDER — LISINOPRIL 20 MG PO TABS: 20.0000 mg | ORAL_TABLET | Freq: Every day | ORAL | 2 refills | Status: AC

## 2024-01-02 MED ORDER — METFORMIN HCL ER 500 MG PO TB24: ORAL_TABLET | ORAL | 2 refills | Status: AC

## 2024-01-02 MED ORDER — OMEPRAZOLE 20 MG PO CPDR: 20.0000 mg | DELAYED_RELEASE_CAPSULE | Freq: Every day | ORAL | 0 refills | Status: DC

## 2024-01-02 MED ORDER — ATORVASTATIN CALCIUM 20 MG PO TABS: 20.0000 mg | ORAL_TABLET | Freq: Every day | ORAL | 0 refills | Status: DC

## 2024-01-02 MED ORDER — AYR SALINE NASAL NA GEL: 1.0000 | NASAL | 1 refills | Status: AC | PRN

## 2024-01-02 NOTE — Patient Instructions (Signed)
 Health Maintenance After Age 79 After age 27, you are at a higher risk for certain long-term diseases and infections as well as injuries from falls. Falls are a major cause of broken bones and head injuries in people who are older than age 73. Getting regular preventive care can help to keep you healthy and well. Preventive care includes getting regular testing and making lifestyle changes as recommended by your health care provider. Talk with your health care provider about: Which screenings and tests you should have. A screening is a test that checks for a disease when you have no symptoms. A diet and exercise plan that is right for you. What should I know about screenings and tests to prevent falls? Screening and testing are the best ways to find a health problem early. Early diagnosis and treatment give you the best chance of managing medical conditions that are common after age 90. Certain conditions and lifestyle choices may make you more likely to have a fall. Your health care provider may recommend: Regular vision checks. Poor vision and conditions such as cataracts can make you more likely to have a fall. If you wear glasses, make sure to get your prescription updated if your vision changes. Medicine review. Work with your health care provider to regularly review all of the medicines you are taking, including over-the-counter medicines. Ask your health care provider about any side effects that may make you more likely to have a fall. Tell your health care provider if any medicines that you take make you feel dizzy or sleepy. Strength and balance checks. Your health care provider may recommend certain tests to check your strength and balance while standing, walking, or changing positions. Foot health exam. Foot pain and numbness, as well as not wearing proper footwear, can make you more likely to have a fall. Screenings, including: Osteoporosis screening. Osteoporosis is a condition that causes  the bones to get weaker and break more easily. Blood pressure screening. Blood pressure changes and medicines to control blood pressure can make you feel dizzy. Depression screening. You may be more likely to have a fall if you have a fear of falling, feel depressed, or feel unable to do activities that you used to do. Alcohol  use screening. Using too much alcohol  can affect your balance and may make you more likely to have a fall. Follow these instructions at home: Lifestyle Do not drink alcohol  if: Your health care provider tells you not to drink. If you drink alcohol : Limit how much you have to: 0-1 drink a day for women. 0-2 drinks a day for men. Know how much alcohol  is in your drink. In the [place] one drink equals one 12 oz bottle of beer (355 mL), one 5 oz glass of wine (148 mL), or one 1 oz glass of hard liquor (44 mL). Do not use any products that contain nicotine or tobacco. These products include cigarettes, chewing tobacco, and vaping devices, such as e-cigarettes. If you need help quitting, ask your health care provider. Activity  Follow a regular exercise program to stay fit. This will help you maintain your balance. Ask your health care provider what types of exercise are appropriate for you. If you need a cane or walker, use it as recommended by your health care provider. Wear supportive shoes that have nonskid soles. Safety  Remove any tripping hazards, such as rugs, cords, and clutter. Install safety equipment such as grab bars in bathrooms and safety rails on stairs. Keep rooms and walkways  well-lit. General instructions Talk with your health care provider about your risks for falling. Tell your health care provider if: You fall. Be sure to tell your health care provider about all falls, even ones that seem minor. You feel dizzy, tiredness (fatigue), or off-balance. Take over-the-counter and prescription medicines only as told by your health care provider. These include  supplements. Eat a healthy diet and maintain a healthy weight. A healthy diet includes low-fat dairy products, low-fat (lean) meats, and fiber from whole grains, beans, and lots of fruits and vegetables. Stay current with your vaccines. Schedule regular health, dental, and eye exams. Summary Having a healthy lifestyle and getting preventive care can help to protect your health and wellness after age 15. Screening and testing are the best way to find a health problem early and help you avoid having a fall. Early diagnosis and treatment give you the best chance for managing medical conditions that are more common for people who are older than age 42. Falls are a major cause of broken bones and head injuries in people who are older than age 64. Take precautions to prevent a fall at home. Work with your health care provider to learn what changes you can make to improve your health and wellness and to prevent falls. This information is not intended to replace advice given to you by your health care provider. Make sure you discuss any questions you have with your health care provider. Document Revised: 08/03/2020 Document Reviewed: 08/03/2020 Elsevier Patient Education  2024 [employer].

## 2024-01-02 NOTE — Progress Notes (Signed)
 Subjective:    Patient ID: Daniel Short, male    DOB: [birth year], 78 y.o.   MRN: [id number]  Chief Complaint  Patient presents with   Medical Management of Chronic Issues   PT presents to the office today for chronic follow up.   Pt is followed by Cardiologists every 6 months for CAD, A Fib, and hx of MI.   Has atherosclerosis of aorta and CAD. Takes Lipitor 20 mg daily.   He has hx of gout and takes allopurinol  100 mg daily. Does not remember last flare up.  Hypertension This is a chronic problem. The current episode started more than 1 year ago. The problem has been waxing and waning since onset. The problem is uncontrolled. Associated symptoms include malaise/fatigue, peripheral edema (right foot) and shortness of breath (some times). Pertinent negatives include no blurred vision. Risk factors for coronary artery disease include obesity, male gender, dyslipidemia and sedentary lifestyle. The current treatment provides moderate improvement.  Hyperlipidemia This is a chronic problem. The current episode started more than 1 year ago. The problem is controlled. Recent lipid tests were reviewed and are normal. Exacerbating diseases include obesity. Associated symptoms include shortness of breath (some times). Current antihyperlipidemic treatment includes statins. The current treatment provides moderate improvement of lipids. Risk factors for coronary artery disease include dyslipidemia, hypertension, male sex, a sedentary lifestyle and obesity.  Diabetes He presents for his follow-up diabetic visit. He has type 2 diabetes mellitus. Associated symptoms include foot paresthesias. Pertinent negatives for diabetes include no blurred vision. Symptoms are stable. Diabetic complications include peripheral neuropathy. Risk factors for coronary artery disease include dyslipidemia, diabetes mellitus, hypertension, male sex and sedentary lifestyle. He is following a generally unhealthy diet. (Does not  check glucose at home) Eye exam is current.  Back Pain This is a chronic problem. The current episode started more than 1 year ago. The problem occurs intermittently. The problem has been waxing and waning since onset. The pain is present in the lumbar spine. The quality of the pain is described as aching. The pain is at a severity of 5/10. The pain is moderate. Risk factors include obesity. He has tried analgesics for the symptoms. The treatment provided mild relief.  Gastroesophageal Reflux He complains of belching and heartburn. This is a chronic problem. The current episode started more than 1 year ago. The problem occurs occasionally. The symptoms are aggravated by certain foods. Risk factors include obesity. He has tried a PPI for the symptoms. The treatment provided moderate relief.  Hip Pain  The incident occurred more than 1 week ago. There was no injury mechanism. The pain is present in the right hip. The quality of the pain is described as aching. The pain is at a severity of 10/10 (when it flares up). The pain is moderate. The pain has been Intermittent since onset. He reports no foreign bodies present. The symptoms are aggravated by movement. He has tried acetaminophen , non-weight bearing and rest for the symptoms. The treatment provided mild relief.   Current opioids rx- Norco 5-325 mg # meds rx- 90 Effectiveness of current meds-not helping with pain at this time Adverse reactions from pain meds-None Morphine  equivalent- 20   Pill count performed-No Last drug screen - 04/07/23 ( high risk q52m, moderate risk q38m, low risk yearly ) Urine drug screen today- Yes Was the [facility] reviewed- Yes             If yes were their any concerning findings? none  Pain contract signed on: 04/07/23    Review of Systems  Constitutional:  Positive for malaise/fatigue.  Eyes:  Negative for blurred vision.  Respiratory:  Positive for shortness of breath (some times).   Gastrointestinal:  Positive  for heartburn.  Musculoskeletal:  Positive for back pain.  All other systems reviewed and are negative.      Objective:   Physical Exam Vitals reviewed.  Constitutional:      General: He is not in acute distress.    Appearance: He is well-developed. He is obese.  HENT:     Head: Normocephalic.     Right Ear: Tympanic membrane normal.     Left Ear: Tympanic membrane normal.  Eyes:     General:        Right eye: No discharge.        Left eye: No discharge.     Pupils: Pupils are equal, round, and reactive to light.  Neck:     Thyroid : No thyromegaly.  Cardiovascular:     Rate and Rhythm: Normal rate and regular rhythm.     Heart sounds: Normal heart sounds. No murmur heard. Pulmonary:     Effort: Pulmonary effort is normal. No respiratory distress.     Breath sounds: Normal breath sounds. No wheezing.  Abdominal:     General: Bowel sounds are normal. There is no distension.     Palpations: Abdomen is soft.     Tenderness: There is no abdominal tenderness.  Musculoskeletal:        General: Tenderness (pain in right knee with flexion and extension) present.     Cervical back: Normal range of motion and neck supple.     Right lower leg: Edema (trace) present.     Left lower leg: Edema (trace) present.     Comments: Pain in lumbar with flexion and extension  Skin:    General: Skin is warm and dry.     Findings: No erythema or rash.  Neurological:     Mental Status: He is alert and oriented to person, place, and time.     Cranial Nerves: No cranial nerve deficit.     Motor: Weakness (walking with cane) present.     Gait: Gait abnormal.     Deep Tendon Reflexes: Reflexes are normal and symmetric.  Psychiatric:        Behavior: Behavior normal.        Thought Content: Thought content normal.        Judgment: Judgment normal.      BP (!) 128/59   Pulse 64   Temp (!) 97.5 F (36.4 C) (Temporal)   Ht 5' 7 (1.702 m)   Wt 218 lb 6.4 oz (99.1 kg)   BMI 34.21 kg/m       Assessment & Plan:   Daniel Short comes in today with chief complaint of Medical Management of Chronic Issues   Diagnosis and orders addressed:  1. Hyperlipidemia associated with type 2 diabetes mellitus (HCC) - atorvastatin  (LIPITOR) 20 MG tablet; Take 1 tablet (20 mg total) by mouth daily.  Dispense: 90 tablet; Refill: 0 - CMP14+EGFR - CBC with Differential/Platelet  2. Controlled substance agreement signed - HYDROcodone -acetaminophen  (NORCO) 7.5-325 MG tablet; Take 1 tablet by mouth every 8 (eight) hours as needed for moderate pain (pain score 4-6).  Dispense: 90 tablet; Refill: 0 - HYDROcodone -acetaminophen  (NORCO) 7.5-325 MG tablet; Take 1 tablet by mouth every 8 (eight) hours as needed for moderate pain (pain score 4-6).  Dispense:  90 tablet; Refill: 0 - HYDROcodone -acetaminophen  (NORCO) 7.5-325 MG tablet; Take 1 tablet by mouth every 8 (eight) hours as needed for moderate pain (pain score 4-6).  Dispense: 90 tablet; Refill: 0 - CMP14+EGFR - CBC with Differential/Platelet  3. Chronic low back pain, unspecified back pain laterality, unspecified whether sciatica present  - HYDROcodone -acetaminophen  (NORCO) 7.5-325 MG tablet; Take 1 tablet by mouth every 8 (eight) hours as needed for moderate pain (pain score 4-6).  Dispense: 90 tablet; Refill: 0 - HYDROcodone -acetaminophen  (NORCO) 7.5-325 MG tablet; Take 1 tablet by mouth every 8 (eight) hours as needed for moderate pain (pain score 4-6).  Dispense: 90 tablet; Refill: 0 - HYDROcodone -acetaminophen  (NORCO) 7.5-325 MG tablet; Take 1 tablet by mouth every 8 (eight) hours as needed for moderate pain (pain score 4-6).  Dispense: 90 tablet; Refill: 0 - CMP14+EGFR - CBC with Differential/Platelet - Ambulatory referral to Orthopedic Surgery  4. Hypertension associated with diabetes (HCC) - lisinopril  (ZESTRIL ) 20 MG tablet; Take 1 tablet (20 mg total) by mouth daily.  Dispense: 90 tablet; Refill: 2 - metoprolol  tartrate (LOPRESSOR ) 25  MG tablet; TAKE ONE TABLET EVERY MORNING AND ONE-HALF TABLET EVERY EVENING  Dispense: 135 tablet; Refill: 0 - CMP14+EGFR - CBC with Differential/Platelet  5. Type 2 diabetes mellitus with other specified complication, without long-term current use of insulin  (HCC) (Primary) - metFORMIN  (GLUCOPHAGE -XR) 500 MG 24 hr tablet; TAKE ONE TABLET ONCE DAILY WITH BREAKFAST  Dispense: 90 tablet; Refill: 2 - CMP14+EGFR - CBC with Differential/Platelet - Bayer DCA Hb A1c Waived    7. Gastroesophageal reflux disease without esophagitis - omeprazole  (PRILOSEC) 20 MG capsule; Take 1 capsule (20 mg total) by mouth daily.  Dispense: 90 capsule; Refill: 0 - CMP14+EGFR - CBC with Differential/Platelet     9. Coronary artery disease involving native coronary artery of native heart without angina pectoris - CMP14+EGFR - CBC with Differential/Platelet  10. Atrial fibrillation with RVR (HCC)  - CMP14+EGFR - CBC with Differential/Platelet  11. Obesity (BMI 30-39.9) - CMP14+EGFR - CBC with Differential/Platelet  12. Paroxysmal atrial fibrillation (HCC)  - CMP14+EGFR - CBC with Differential/Platelet  13. Vitamin B 12 deficiency  - CMP14+EGFR - CBC with Differential/Platelet  14. Atherosclerosis of aorta - CMP14+EGFR - CBC with Differential/Platelet  15. Primary osteoarthritis involving multiple joints - Ambulatory referral to Orthopedic Surgery  Labs pending Patient reviewed in [place] controlled database, no flags noted. Contract and drug screen are up to date.  Continue current medications  Keep specialists follow up Referral to Ortho pending  Health Maintenance reviewed Diet and exercise encouraged  Follow up plan: 3 months    Bari Learn, FNP

## 2024-01-03 LAB — CBC WITH DIFFERENTIAL/PLATELET
Basophils Absolute: 0.1 x10E3/uL (ref 0.0–0.2)
Basos: 1 %
EOS (ABSOLUTE): 0.1 x10E3/uL (ref 0.0–0.4)
Eos: 2 %
Hematocrit: 41.6 % (ref 37.5–51.0)
Hemoglobin: 13.2 g/dL (ref 13.0–17.7)
Immature Grans (Abs): 0 x10E3/uL (ref 0.0–0.1)
Immature Granulocytes: 0 %
Lymphocytes Absolute: 2.8 x10E3/uL (ref 0.7–3.1)
Lymphs: 34 %
MCH: 30.8 pg (ref 26.6–33.0)
MCHC: 31.7 g/dL (ref 31.5–35.7)
MCV: 97 fL (ref 79–97)
Monocytes Absolute: 1 x10E3/uL — ABNORMAL HIGH (ref 0.1–0.9)
Monocytes: 11 %
Neutrophils Absolute: 4.3 x10E3/uL (ref 1.4–7.0)
Neutrophils: 51 %
Platelets: 199 x10E3/uL (ref 150–450)
RBC: 4.28 x10E6/uL (ref 4.14–5.80)
RDW: 12.9 % (ref 11.6–15.4)
WBC: 8.4 x10E3/uL (ref 3.4–10.8)

## 2024-01-03 LAB — CMP14+EGFR
ALT: 21 IU/L (ref 0–44)
AST: 26 IU/L (ref 0–40)
Albumin: 4.2 g/dL (ref 3.8–4.8)
Alkaline Phosphatase: 61 IU/L (ref 47–123)
BUN/Creatinine Ratio: 10 (ref 10–24)
BUN: 11 mg/dL (ref 8–27)
Bilirubin Total: 2.3 mg/dL — AB (ref 0.0–1.2)
CO2: 25 mmol/L (ref 20–29)
Calcium: 9.4 mg/dL (ref 8.6–10.2)
Chloride: 103 mmol/L (ref 96–106)
Creatinine, Ser: 1.13 mg/dL (ref 0.76–1.27)
Globulin, Total: 2.1 g/dL (ref 1.5–4.5)
Glucose: 86 mg/dL (ref 70–99)
Potassium: 3.9 mmol/L (ref 3.5–5.2)
Sodium: 141 mmol/L (ref 134–144)
Total Protein: 6.3 g/dL (ref 6.0–8.5)
eGFR: 67 mL/min/1.73 (ref >59)

## 2024-01-04 ENCOUNTER — Ambulatory Visit: Payer: Self-pay | Admitting: Family

## 2024-01-04 DIAGNOSIS — R17 Unspecified jaundice: Secondary | ICD-10-CM

## 2024-01-12 ENCOUNTER — Ambulatory Visit (HOSPITAL_COMMUNITY)

## 2024-01-16 ENCOUNTER — Ambulatory Visit (HOSPITAL_COMMUNITY)

## 2024-01-18 DIAGNOSIS — H2512 Age-related nuclear cataract, left eye: Secondary | ICD-10-CM | POA: Diagnosis not present

## 2024-01-18 DIAGNOSIS — E1136 Type 2 diabetes mellitus with diabetic cataract: Secondary | ICD-10-CM | POA: Diagnosis not present

## 2024-01-18 DIAGNOSIS — H25812 Combined forms of age-related cataract, left eye: Secondary | ICD-10-CM | POA: Diagnosis not present

## 2024-01-18 DIAGNOSIS — I2510 Atherosclerotic heart disease of native coronary artery without angina pectoris: Secondary | ICD-10-CM | POA: Diagnosis not present

## 2024-01-24 ENCOUNTER — Ambulatory Visit: Attending: Cardiology | Admitting: Cardiology

## 2024-01-24 ENCOUNTER — Encounter: Payer: Self-pay | Admitting: Cardiology

## 2024-01-24 VITALS — BP 124/72 | HR 58 | Ht 67.0 in | Wt 220.0 lb

## 2024-01-24 DIAGNOSIS — I25119 Atherosclerotic heart disease of native coronary artery with unspecified angina pectoris: Secondary | ICD-10-CM | POA: Diagnosis not present

## 2024-01-24 DIAGNOSIS — I10 Essential (primary) hypertension: Secondary | ICD-10-CM | POA: Diagnosis not present

## 2024-01-24 DIAGNOSIS — E782 Mixed hyperlipidemia: Secondary | ICD-10-CM

## 2024-01-24 DIAGNOSIS — I480 Paroxysmal atrial fibrillation: Secondary | ICD-10-CM

## 2024-01-24 NOTE — Patient Instructions (Addendum)
Medication Instructions:  Your physician recommends that you continue on your current medications as directed. Please refer to the Current Medication list given to you today.  Labwork: none  Testing/Procedures: none  Follow-Up: Your physician recommends that you schedule a follow-up appointment in: 6 month  Any Other Special Instructions Will Be Listed Below (If Applicable).  If you need a refill on your cardiac medications before your next appointment, please call your pharmacy. 

## 2024-01-24 NOTE — Progress Notes (Signed)
    Cardiology Office Note  Date: 01/24/2024   ID: Daniel Short 07/02/1945, MRN [id number]  History of Present Illness: Daniel Short is a 78 y.o. male last seen in April.  He is here today with his wife for a follow-up visit.  He does not report any angina or sense of palpitations.  Recently had cataract surgery.  Also undergoing workup for hyperbilirubinemia per PCP.  I reviewed his most recent lab work.  We went over his medications which are stable from a cardiac perspective.  Blood pressure is well-controlled today.  Last LDL was 63 on Lipitor 20 mg daily.  I reviewed his ECG today which shows sinus rhythm with left anterior fascicular block.  Physical Exam: VS:  BP 124/72   Pulse (!) 58   Ht 5' 7 (1.702 m)   Wt 220 lb (99.8 kg)   SpO2 97%   BMI 34.46 kg/m , BMI Body mass index is 34.46 kg/m.  Wt Readings from Last 3 Encounters:  01/24/24 220 lb (99.8 kg)  01/02/24 218 lb 6.4 oz (99.1 kg)  09/28/23 223 lb (101.2 kg)    General: Patient appears comfortable at rest. HEENT: Conjunctiva and lids normal. Neck: Supple, no elevated JVP or carotid bruits. Lungs: Clear to auscultation, nonlabored breathing at rest. Cardiac: Regular rate and rhythm, no S3 or significant systolic murmur. Extremities: No pitting edema.  ECG:  An ECG dated 10/17/2022 was personally reviewed today and demonstrated:  Sinus rhythm with prolonged PR interval, left anterior fascicular block, incomplete right bundle branch block, PACs.  Labwork: 07/07/2023: TSH 3.030 01/02/2024: ALT 21; AST 26; BUN 11; Creatinine, Ser 1.13; Hemoglobin 13.2; Platelets 199; Potassium 3.9; Sodium 141     Component Value Date/Time   CHOL 124 04/07/2023 1153   TRIG 146 04/07/2023 1153   HDL 36 (L) 04/07/2023 1153   CHOLHDL 3.4 04/07/2023 1153   CHOLHDL 3.8 05/28/2020 0423   VLDL 24 05/28/2020 0423   LDLCALC 63 04/07/2023 1153   LDLDIRECT 82 05/19/2016 1616   Other Studies Reviewed Today:  No interval  cardiac testing for review today.  Assessment and Plan:  1.  CAD status post BMS to the ostial/proximal RCA in 2003.  LVEF approximately 50% by echocardiogram in March 2022.  He remains symptomatically stable without recurring angina on medical therapy.  Continue aspirin  81 mg daily and statin.  ECG reviewed and stable.   2.  Paroxysmal atrial fibrillation with CHA2DS2-VASc score of 5.  He is not anticoagulated with prior history of subarachnoid hemorrhage and hemorrhagic stroke.  He has declined Watchman device consultation as well.  He does not report any interval palpitations and is in sinus rhythm today.   3.  Mixed hyperlipidemia.  LDL 63 in January.  Continue Lipitor 20 mg daily.   4.  Primary hypertension.  Blood pressure is well-controlled today.  Continue Lopressor  25 mg twice daily and lisinopril  20 mg daily.  Disposition:  Follow up 6 months.  Signed, Jayson JUDITHANN Sierras, M.D., F.A.C.C. [facility] [place]

## 2024-01-25 ENCOUNTER — Ambulatory Visit (HOSPITAL_COMMUNITY)
Admission: RE | Admit: 2024-01-25 | Discharge: 2024-01-25 | Disposition: A | Discharge status: Home / Self Care | Source: Ambulatory Visit | Attending: Family | Admitting: Family

## 2024-01-25 DIAGNOSIS — Z9049 Acquired absence of other specified parts of digestive tract: Secondary | ICD-10-CM | POA: Diagnosis not present

## 2024-01-25 DIAGNOSIS — R17 Unspecified jaundice: Secondary | ICD-10-CM | POA: Insufficient documentation

## 2024-01-25 DIAGNOSIS — K838 Other specified diseases of biliary tract: Secondary | ICD-10-CM | POA: Diagnosis not present

## 2024-01-31 ENCOUNTER — Telehealth: Payer: Self-pay | Admitting: Family Medicine

## 2024-01-31 NOTE — Telephone Encounter (Unsigned)
 Copied from CRM [id number]. Topic: Clinical - Lab/Test Results >> Jan 31, 2024  9:40 AM Ivette P wrote: Reason for CRM: Pt Wife Beulah called in to follow up on the results of the CT scan performed on 01/25/2024. Pt was advsied primary wuold call and give results. Pt would like to know the determination and what the results were.   Advised primary has not reviewed.    Please follow up with pt before end of day.   Call [phone] please and speka to pt

## 2024-02-01 NOTE — Telephone Encounter (Signed)
 Patient aware and verbalized understanding.

## 2024-02-01 NOTE — Telephone Encounter (Signed)
 US  abdomen showed, Hepatic steatosis noted. Low fat diet and exercise. Limit alcohol and tylenol .

## 2024-03-29 ENCOUNTER — Ambulatory Visit: Payer: Self-pay

## 2024-03-29 NOTE — Telephone Encounter (Signed)
 FYI Only or Action Required?: FYI only for provider: appointment scheduled on has previous scheduled follow up appointment on 04/08/24. Acute visit added to wait list.  Patient was last seen in primary care on 01/02/2024 by Lavell Bari LABOR, FNP.  Called Nurse Triage reporting Nasal Congestion.Choked on lettuce  Symptoms began today.  Interventions attempted: OTC medications: allergy medication and Rest, hydration, or home remedies.  Symptoms are: gradually improving.  Triage Disposition: See PCP Within 2 Weeks  Patient/caregiver understands and will follow disposition?: Yes  Copied from CRM [id number]. Topic: Clinical - Red Word Triage >> Mar 29, 2024  4:17 PM Victoria B wrote: Patient/'s wife called in with patient, has severe pain in legs and back when he moves, and mucous draining from his nose constantly and is in chest Reason for Disposition  Cleared the FB (coughed up and spit out the foreign body)  [1] Nasal allergies AND [2] year-round symptoms  Answer Assessment - Initial Assessment Questions On Zyrtec.    1. SYMPTOM: What's the main symptom you're concerned about? (e.g., runny nose, stuffiness, sneezing, itching)     Nasal drainage  2. SEVERITY: How bad is it? What does it keep you from doing? (e.g., sleeping, working)      moderate 3. EYES: Are your eyes also red, watery, and itchy?      Itchy and watery 4. TRIGGER: What pollen or other allergic substance do you think is causing the symptoms?      unsure 5. TREATMENT: What medicine are you using? What medicine worked best in the past?      6. OTHER SYMPTOMS: Do you have any other symptoms? (e.g., coughing, difficulty breathing, wheezing)     Post nasal drip causes a cough, not coughing without drip. Chronic back pain-no change 7. PREGNANCY: Is there any chance you are pregnant? When was your last menstrual period?  Answer Assessment - Initial Assessment Questions 1. SUBSTANCE: What did you (the  patient) choke on?  (e.g., food)     lettuce 2. SIZE: If the object was solid, ask: How big was it?      small 3. ONSET: When did it begin? (e.g., minutes ago)      today 4. RESPIRATORY DISTRESS: Describe the patient's breathing., Are they able to talk?     Able to talk, no distress  Protocols used: Nasal Allergies (Hay Fever)-A-AH, Choking - Inhaled Foreign Body-A-AH

## 2024-03-29 NOTE — Telephone Encounter (Signed)
 Appointment made for acute problems 04/04/2024 with PCP.

## 2024-04-04 ENCOUNTER — Encounter: Payer: Self-pay | Admitting: Family

## 2024-04-04 ENCOUNTER — Ambulatory Visit: Admitting: Family

## 2024-04-04 VITALS — BP 136/74 | HR 77 | Temp 98.1°F | Ht 67.0 in | Wt 216.4 lb

## 2024-04-04 DIAGNOSIS — E538 Deficiency of other specified B group vitamins: Secondary | ICD-10-CM

## 2024-04-04 DIAGNOSIS — Z79899 Other long term (current) drug therapy: Secondary | ICD-10-CM

## 2024-04-04 DIAGNOSIS — I2510 Atherosclerotic heart disease of native coronary artery without angina pectoris: Secondary | ICD-10-CM

## 2024-04-04 DIAGNOSIS — M545 Low back pain, unspecified: Secondary | ICD-10-CM | POA: Diagnosis not present

## 2024-04-04 DIAGNOSIS — E1159 Type 2 diabetes mellitus with other circulatory complications: Secondary | ICD-10-CM

## 2024-04-04 DIAGNOSIS — I152 Hypertension secondary to endocrine disorders: Secondary | ICD-10-CM | POA: Diagnosis not present

## 2024-04-04 DIAGNOSIS — E785 Hyperlipidemia, unspecified: Secondary | ICD-10-CM

## 2024-04-04 DIAGNOSIS — K219 Gastro-esophageal reflux disease without esophagitis: Secondary | ICD-10-CM | POA: Diagnosis not present

## 2024-04-04 DIAGNOSIS — I480 Paroxysmal atrial fibrillation: Secondary | ICD-10-CM | POA: Diagnosis not present

## 2024-04-04 DIAGNOSIS — M109 Gout, unspecified: Secondary | ICD-10-CM

## 2024-04-04 DIAGNOSIS — E66811 Obesity, class 1: Secondary | ICD-10-CM

## 2024-04-04 DIAGNOSIS — E669 Obesity, unspecified: Secondary | ICD-10-CM

## 2024-04-04 DIAGNOSIS — I700 Atherosclerosis of aorta: Secondary | ICD-10-CM | POA: Diagnosis not present

## 2024-04-04 DIAGNOSIS — E1169 Type 2 diabetes mellitus with other specified complication: Secondary | ICD-10-CM | POA: Diagnosis not present

## 2024-04-04 LAB — BAYER DCA HB A1C WAIVED: HB A1C (BAYER DCA - WAIVED): 6 % — ABNORMAL HIGH (ref 4.8–5.6)

## 2024-04-04 MED ORDER — HYDROCODONE-ACETAMINOPHEN 7.5-325 MG PO TABS: 1.0000 | ORAL_TABLET | Freq: Three times a day (TID) | ORAL | 0 refills | Status: AC | PRN

## 2024-04-04 MED ORDER — ALLOPURINOL 100 MG PO TABS: ORAL_TABLET | ORAL | 3 refills | Status: AC

## 2024-04-04 MED ORDER — OMEPRAZOLE 40 MG PO CPDR: 40.0000 mg | DELAYED_RELEASE_CAPSULE | Freq: Every day | ORAL | 3 refills | Status: AC

## 2024-04-04 MED ORDER — ATORVASTATIN CALCIUM 20 MG PO TABS: 20.0000 mg | ORAL_TABLET | Freq: Every day | ORAL | 0 refills | Status: AC

## 2024-04-04 MED ORDER — METOPROLOL TARTRATE 25 MG PO TABS: ORAL_TABLET | ORAL | 0 refills | Status: AC

## 2024-04-04 NOTE — Patient Instructions (Signed)
 Problems With Swallowing (Dysphagia): What to Know  Dysphagia means having trouble swallowing. It happens when food and drinks stick in your throat on the way down to your stomach, or when food takes longer to get to your stomach than usual. You may have problems swallowing food, liquids, or both. You may also have pain while trying to swallow. It may take you more time and effort to swallow something. What are the causes? Dysphagia may be caused by: Muscle problems. These may make it hard for you to move food and liquids through your esophagus, which is the tube that connects your mouth to your stomach. Blockages. You may have ulcers, scar tissue, or inflammation that blocks the normal passage of food and liquids. Causes of these problems include: Acid reflux from your stomach into your esophagus. Infections. Radiation treatment for cancer. Taking medicines without enough fluids to help them go down into your stomach. Stroke. This can affect the nerves and make it hard to swallow. Nerve problems. These prevent signals from being sent to the muscles of your esophagus. These signals tell the muscles to squeeze (contract) and move what you swallow down to your stomach. Globus pharyngeus. This is a common problem that can make you feel like something is stuck in your throat or like swallowing is difficult, even though nothing is wrong with the swallowing passages. Certain conditions, such as cerebral palsy or Parkinson's disease. What are the signs or symptoms? Common symptoms include: A feeling that solids or liquids are stuck in your throat on the way down to the stomach. Pain while swallowing. Coughing or gagging while trying to swallow. Other symptoms include: Food moving back from your stomach to your mouth. This is called regurgitation. Noises coming from your throat. Chest discomfort when swallowing. A feeling of fullness when swallowing. Drooling, especially when the throat is  blocked. Heartburn. How is this diagnosed? Dysphagia may be diagnosed by doing tests. These may include: Video fluoroscopic swallow study, also called a barium swallow. In this test, you'll swallow a white liquid that sticks to the inside of your esophagus. X-ray images are then taken. Endoscopy. In this test, a flexible scope is put down your throat to look at your esophagus and your stomach. CT scans or an MRI. How is this treated? Treatment for dysphagia depends on the cause: If it's caused by acid reflux or infection, medicines may be used. These may include antibiotics or heartburn medicines. If it's caused by problems with the muscles, you may need swallowing therapy. This can help you strengthen your swallowing muscles. You may have to do specific exercises to strengthen the muscles or stretch them. If it's caused by a blockage or mass, procedures to remove the blockage may be done. You may need surgery and a feeding tube. You may also need to make diet changes. Talk with your health care provider about specific changes. Follow these instructions at home: Medicines Take your medicines only as told. If you were given antibiotics, take them as told. Do not stop taking them even if you start to feel better. Eating and drinking  Make any diet changes as told by your provider. Work with an Financial controller in healthy eating called a dietitian. They can help you create an eating plan to make sure you get the nutrients you need. Eat soft foods that are easier to swallow. Cut your food into small pieces and eat slowly. Take small bites. Eat and drink only when you're sitting upright. Do not drink alcohol or  caffeine . If you need help quitting, ask your provider. General instructions Check your weight every day to make sure you're not losing weight. Do not smoke, vape, or use nicotine  or tobacco. Contact a health care provider if: You lose weight because you can't swallow. You cough when you drink  liquids. You cough up partially digested food. Get help right away if: You can't swallow your saliva. You have shortness of breath, a fever, or both. Your voice is hoarse and you have trouble swallowing. These symptoms may be an emergency. Call 911 right away. Do not wait to see if the symptoms will go away. Do not drive yourself to the hospital. This information is not intended to replace advice given to you by your health care provider. Make sure you discuss any questions you have with your health care provider. Document Revised: 02/09/2023 Document Reviewed: 02/09/2023 Elsevier Patient Education  2025 [employer].

## 2024-04-04 NOTE — Progress Notes (Signed)
 "  Subjective:    Patient ID: Daniel Short, male    DOB: Feb 25, 1946, 79 y.o.   MRN: [id number]  Chief Complaint  Patient presents with   mucus    Gets thick in throat can cause him to choke   PT presents to the office today for chronic follow up.   Pt is followed by Cardiologists every 6 months for CAD, A Fib, and hx of MI.   Has atherosclerosis of aorta and CAD. Takes Lipitor 20 mg daily.   He has hx of gout and takes allopurinol  100 mg daily. Does not remember last flare up.  Hypertension This is a chronic problem. The current episode started more than 1 year ago. The problem has been resolved since onset. The problem is controlled. Associated symptoms include malaise/fatigue, peripheral edema (right foot) and shortness of breath (some times). Pertinent negatives include no blurred vision. Risk factors for coronary artery disease include obesity, male gender, dyslipidemia and sedentary lifestyle. The current treatment provides moderate improvement.  Hyperlipidemia This is a chronic problem. The current episode started more than 1 year ago. The problem is controlled. Recent lipid tests were reviewed and are normal. Exacerbating diseases include obesity. Associated symptoms include shortness of breath (some times). Current antihyperlipidemic treatment includes statins. The current treatment provides moderate improvement of lipids. Risk factors for coronary artery disease include dyslipidemia, hypertension, male sex, a sedentary lifestyle and obesity.  Diabetes He presents for his follow-up diabetic visit. He has type 2 diabetes mellitus. Associated symptoms include foot paresthesias. Pertinent negatives for diabetes include no blurred vision. Symptoms are stable. Diabetic complications include peripheral neuropathy. Risk factors for coronary artery disease include dyslipidemia, diabetes mellitus, hypertension, male sex, sedentary lifestyle and obesity. He is following a generally unhealthy  diet. (Does not check glucose at home) Eye exam is current.  Back Pain This is a chronic problem. The current episode started more than 1 year ago. The problem occurs intermittently. The problem has been waxing and waning since onset. The pain is present in the lumbar spine. The quality of the pain is described as aching. The pain is at a severity of 5/10. The pain is moderate. Risk factors include obesity. He has tried analgesics for the symptoms. The treatment provided mild relief.  Gastroesophageal Reflux He complains of belching, coughing, dysphagia and heartburn. This is a chronic problem. The current episode started more than 1 year ago. The problem occurs occasionally. The symptoms are aggravated by certain foods. Risk factors include obesity. He has tried a PPI for the symptoms. The treatment provided moderate relief.  Hip Pain  The incident occurred more than 1 week ago. There was no injury mechanism. The pain is present in the right hip. The quality of the pain is described as aching. The pain is at a severity of 10/10 (when it flares up). The pain is moderate. The pain has been Intermittent since onset. He reports no foreign bodies present. The symptoms are aggravated by movement. He has tried acetaminophen , non-weight bearing and rest for the symptoms. The treatment provided mild relief.  Cough This is a new problem. The current episode started in the past 7 days. The problem has been unchanged. The cough is Productive of sputum. Associated symptoms include heartburn and shortness of breath (some times). He has tried rest for the symptoms. The treatment provided mild relief.   Current opioids rx- Norco 5-325 mg # meds rx- 90 Effectiveness of current meds-not helping with pain at this time Adverse reactions  from pain meds-None Morphine  equivalent- 20   Pill count performed-No Last drug screen - 04/07/23 ( high risk q80m, moderate risk q24m, low risk yearly ) Urine drug screen today-  Yes Was the [facility] reviewed- Yes             If yes were their any concerning findings? none   Pain contract signed on: 04/07/23    Review of Systems  Constitutional:  Positive for malaise/fatigue.  Eyes:  Negative for blurred vision.  Respiratory:  Positive for cough and shortness of breath (some times).   Gastrointestinal:  Positive for dysphagia and heartburn.  Musculoskeletal:  Positive for back pain.  All other systems reviewed and are negative.      Objective:   Physical Exam Vitals reviewed.  Constitutional:      General: He is not in acute distress.    Appearance: He is well-developed. He is obese.  HENT:     Head: Normocephalic.     Right Ear: Tympanic membrane normal.     Left Ear: Tympanic membrane normal.  Eyes:     General:        Right eye: No discharge.        Left eye: No discharge.     Pupils: Pupils are equal, round, and reactive to light.  Neck:     Thyroid : No thyromegaly.  Cardiovascular:     Rate and Rhythm: Normal rate and regular rhythm.     Heart sounds: Normal heart sounds. No murmur heard. Pulmonary:     Effort: Pulmonary effort is normal. No respiratory distress.     Breath sounds: Normal breath sounds. No wheezing.  Abdominal:     General: Bowel sounds are normal. There is no distension.     Palpations: Abdomen is soft.     Tenderness: There is no abdominal tenderness.  Musculoskeletal:        General: Tenderness (pain in right knee with flexion and extension) present.     Cervical back: Normal range of motion and neck supple.     Right lower leg: Edema (trace) present.     Left lower leg: Edema (trace) present.     Comments: Pain in lumbar with flexion and extension  Skin:    General: Skin is warm and dry.     Findings: No erythema or rash.  Neurological:     Mental Status: He is alert and oriented to person, place, and time.     Cranial Nerves: No cranial nerve deficit.     Motor: Weakness (walking with cane) present.     Gait:  Gait abnormal.     Deep Tendon Reflexes: Reflexes are normal and symmetric.  Psychiatric:        Behavior: Behavior normal.        Thought Content: Thought content normal.        Judgment: Judgment normal.      BP 136/74   Pulse 77   Temp 98.1 F (36.7 C) (Temporal)   Ht 5' 7 (1.702 m)   Wt 216 lb 6.4 oz (98.2 kg)   BMI 33.89 kg/m      Assessment & Plan:   Daniel Short comes in today with chief complaint of mucus (Gets thick in throat can cause him to choke)   Diagnosis and orders addressed:  1. Acute gout involving toe of right foot, unspecified cause - allopurinol  (ZYLOPRIM ) 100 MG tablet; TAKE 1 TABLET ONCE DAILY FOR GOUT  Dispense: 90 tablet; Refill: 3 -  CMP14+EGFR  2. Hyperlipidemia associated with type 2 diabetes mellitus (HCC) - atorvastatin  (LIPITOR) 20 MG tablet; Take 1 tablet (20 mg total) by mouth daily.  Dispense: 90 tablet; Refill: 0 - CMP14+EGFR  3. Controlled substance agreement signed - HYDROcodone -acetaminophen  (NORCO) 7.5-325 MG tablet; Take 1 tablet by mouth every 8 (eight) hours as needed for moderate pain (pain score 4-6).  Dispense: 90 tablet; Refill: 0 - HYDROcodone -acetaminophen  (NORCO) 7.5-325 MG tablet; Take 1 tablet by mouth every 8 (eight) hours as needed for moderate pain (pain score 4-6).  Dispense: 90 tablet; Refill: 0 - HYDROcodone -acetaminophen  (NORCO) 7.5-325 MG tablet; Take 1 tablet by mouth every 8 (eight) hours as needed for moderate pain (pain score 4-6).  Dispense: 90 tablet; Refill: 0 - CMP14+EGFR  4. Chronic low back pain, unspecified back pain laterality, unspecified whether sciatica present - HYDROcodone -acetaminophen  (NORCO) 7.5-325 MG tablet; Take 1 tablet by mouth every 8 (eight) hours as needed for moderate pain (pain score 4-6).  Dispense: 90 tablet; Refill: 0 - HYDROcodone -acetaminophen  (NORCO) 7.5-325 MG tablet; Take 1 tablet by mouth every 8 (eight) hours as needed for moderate pain (pain score 4-6).  Dispense: 90  tablet; Refill: 0 - HYDROcodone -acetaminophen  (NORCO) 7.5-325 MG tablet; Take 1 tablet by mouth every 8 (eight) hours as needed for moderate pain (pain score 4-6).  Dispense: 90 tablet; Refill: 0 - CMP14+EGFR  5. Hypertension associated with diabetes (HCC) - metoprolol  tartrate (LOPRESSOR ) 25 MG tablet; TAKE ONE TABLET EVERY MORNING AND ONE-HALF TABLET EVERY EVENING  Dispense: 135 tablet; Refill: 0 - CMP14+EGFR  6. Gastroesophageal reflux disease without esophagitis - omeprazole  (PRILOSEC) 40 MG capsule; Take 1 capsule (40 mg total) by mouth daily.  Dispense: 90 capsule; Refill: 3 - CMP14+EGFR  7. Paroxysmal atrial fibrillation (HCC) - CMP14+EGFR  8. Atherosclerosis of aorta  - CMP14+EGFR  9. Coronary artery disease involving native coronary artery of native heart without angina pectoris - CMP14+EGFR  10. Type 2 diabetes mellitus with other specified complication, without long-term current use of insulin  (HCC) (Primary)  - CMP14+EGFR - Bayer DCA Hb A1c Waived  11. Obesity (BMI 30-39.9)  - CMP14+EGFR  12. Vitamin B 12 deficiency - CMP14+EGFR  Labs pending Will increase Prilosec to 40 mg from 20 mg -Diet discussed- Avoid fried, spicy, citrus foods, caffeine and alcohol -Do not eat 2-3 hours before bedtime -Encouraged small frequent meals -Avoid NSAID's Patient reviewed in [place] controlled database, no flags noted. Contract and drug screen are up to date.  Continue current medications  Keep specialists follow up Referral to Ortho pending  Health Maintenance reviewed Diet and exercise encouraged  Follow up plan: 3 months    Bari Learn, FNP  "

## 2024-04-05 ENCOUNTER — Ambulatory Visit: Payer: Self-pay | Admitting: Family

## 2024-04-05 LAB — CMP14+EGFR
ALT: 17 IU/L (ref 0–44)
AST: 24 IU/L (ref 0–40)
Albumin: 4.1 g/dL (ref 3.8–4.8)
Alkaline Phosphatase: 59 IU/L (ref 47–123)
BUN/Creatinine Ratio: 8 — ABNORMAL LOW (ref 10–24)
BUN: 8 mg/dL (ref 8–27)
Bilirubin Total: 2 mg/dL — ABNORMAL HIGH (ref 0.0–1.2)
CO2: 24 mmol/L (ref 20–29)
Calcium: 9.1 mg/dL (ref 8.6–10.2)
Chloride: 103 mmol/L (ref 96–106)
Creatinine, Ser: 1.01 mg/dL (ref 0.76–1.27)
Globulin, Total: 2.2 g/dL (ref 1.5–4.5)
Glucose: 133 mg/dL — ABNORMAL HIGH (ref 70–99)
Potassium: 3.9 mmol/L (ref 3.5–5.2)
Sodium: 142 mmol/L (ref 134–144)
Total Protein: 6.3 g/dL (ref 6.0–8.5)
eGFR: 76 mL/min/1.73

## 2024-04-08 ENCOUNTER — Ambulatory Visit: Admitting: Family

## 2024-04-09 NOTE — Telephone Encounter (Unsigned)
 Copied from CRM [id number]. Topic: Clinical - Lab/Test Results >> Apr 09, 2024 10:01 AM Delon DASEN wrote: Reason for CRM: results read verbatim- wife would like a call to patient with the results as well [phone]

## 2024-04-12 ENCOUNTER — Telehealth: Payer: Self-pay | Admitting: Family

## 2024-04-12 DIAGNOSIS — W44F3XA Food entering into or through a natural orifice, initial encounter: Secondary | ICD-10-CM

## 2024-04-12 NOTE — Telephone Encounter (Signed)
 Called and spoke with patients wife and told her Bari next steps would to be go ahead and do referral to GI. They agree and referral placed.

## 2024-04-12 NOTE — Telephone Encounter (Signed)
 Copied from CRM [id number]. Topic: General - Other >> Apr 12, 2024  9:01 AM Diannia H wrote: Reason for CRM: Patients wife called because patient is still having a hard time eating without getting chocked. Mrs, Fagin is wanting to speak with a nurse or the provider. Could you assist? Patients wife callback number is [phone] and also [phone]. Try the second number first.

## 2024-04-19 ENCOUNTER — Ambulatory Visit: Admitting: Gastroenterology

## 2024-04-19 ENCOUNTER — Encounter: Payer: Self-pay | Admitting: Gastroenterology

## 2024-04-19 VITALS — BP 110/58 | HR 77 | Ht 67.0 in | Wt 211.5 lb

## 2024-04-19 DIAGNOSIS — Z1211 Encounter for screening for malignant neoplasm of colon: Secondary | ICD-10-CM

## 2024-04-19 DIAGNOSIS — R1312 Dysphagia, oropharyngeal phase: Secondary | ICD-10-CM | POA: Diagnosis not present

## 2024-04-19 DIAGNOSIS — T402X5A Adverse effect of other opioids, initial encounter: Secondary | ICD-10-CM

## 2024-04-19 DIAGNOSIS — K5903 Drug induced constipation: Secondary | ICD-10-CM | POA: Diagnosis not present

## 2024-04-19 DIAGNOSIS — K219 Gastro-esophageal reflux disease without esophagitis: Secondary | ICD-10-CM | POA: Diagnosis not present

## 2024-04-19 DIAGNOSIS — K760 Fatty (change of) liver, not elsewhere classified: Secondary | ICD-10-CM | POA: Diagnosis not present

## 2024-04-19 NOTE — Progress Notes (Signed)
 "  Chief Complaint:Choking due to food in larynx, initial encounter  Primary GI Doctor: Dr. Federico  HPI:  Patient is a  79  year old male patient with past medical history of CAD, paroxysmal atrial fibrillation, hyperlipidemia, hypertension, prior history of subarachnoid hemorrhage and hemorrhagic stroke, who was referred to me by Lavell Bari LABOR, FNP on 04/12/24 for a evaluation of Choking due to food in larynx, initial encounter.    01/24/2024 patient seen by cardiology for follow-up. Reviewed entire note.  Interval History Patient presents for evaluation of dysphagia.  Patient presents in wheelchair. He uses cane or walker at home.  Accompanied by his wife.  Patient reports oropharyngeal dysphagia with both solids and liquids. Reports he has to take small sips of liquids. Patient complains of post nasal drip and nose runs all the time. Patient is taking Xyzal  po daily for seasonal allergies, reports they have been worse the past year. Never been seen by allergist or ENT.  Patient has history of GERD and currently taking Omeprazole  40 mg p.o. daily. Patient has lost about 9lbs since October. Reports he has been eating less and altered taste buds.  Records show stroke from 2000, patient cannot recall information. Unable to give further information.   Patient has never had EGD/colon.   He has occasional constipation and hard to pass stools sometimes. Denies blood in stool.  Denies consuming a lot of fiber.  Patient takes Hydrocodone  three to four times daily for chronic back pain.   Reports history of jaundice. Wife states he had CTAP in November at [facility] with PCP and she reports he was told he had fatty liver.  Admits to poor diet.   Nonsmoker. No alcohol use.   Patient taking Baby ASA 81mg  po daily.   Surgical history: lap cholecystectomy, back surgeries   Patient's family history includes: no esophageal CA, no colon CA  Wt Readings from Last 3 Encounters:   04/19/24 211 lb 8 oz (95.9 kg)  04/04/24 216 lb 6.4 oz (98.2 kg)  01/24/24 220 lb (99.8 kg)    Past Medical History:  Diagnosis Date   Arthritis    Basal cell carcinoma 1997   left ear tx with biopsy   CAD (coronary artery disease)    Status post proximal/ostial RCA Zeta stents 2003   Chronic back pain    Chronic liver disease    Ear infection 1979   Right ear due to wax build up   Gallstones    Headache    History of stroke 2000   Intracerebral hemorrhage   Kidney stones    Myocardial infarction [facility]) 2003   Paroxysmal atrial fibrillation (HCC)    Type 2 diabetes mellitus (HCC)     Past Surgical History:  Procedure Laterality Date   BILIARY STENT PLACEMENT N/A 02/06/2020   Procedure: BILIARY STENT PLACEMENT;  Surgeon: Golda Claudis PENNER, MD;  Location: [facility];  Service: Endoscopy;  Laterality: N/A;   CARPAL TUNNEL RELEASE Left    Cataract surgery Bilateral 01/2024   CHOLECYSTECTOMY N/A 09/06/2021   Procedure: LAPAROSCOPIC CHOLECYSTECTOMY;  Surgeon: Kallie Manuelita BROCKS, MD;  Location: [facility];  Service: General;  Laterality: N/A;   CORONARY STENT PLACEMENT  2005   ERCP N/A 02/06/2020   Procedure: ENDOSCOPIC RETROGRADE CHOLANGIOPANCREATOGRAPHY (ERCP) with stone extraction;  Surgeon: Golda Claudis PENNER, MD;  Location: [facility];  Service: Endoscopy;  Laterality: N/A;  1:15   ERCP N/A 05/27/2020   Procedure: ENDOSCOPIC RETROGRADE CHOLANGIOPANCREATOGRAPHY (ERCP) WITH PROPOFOL ;  Surgeon:  Golda Claudis PENNER, MD;  Location: [facility];  Service: Endoscopy;  Laterality: N/A;   GASTROINTESTINAL STENT REMOVAL N/A 05/27/2020   Procedure: STENT REMOVAL;  Surgeon: Golda Claudis PENNER, MD;  Location: [facility];  Service: Endoscopy;  Laterality: N/A;   HERNIA REPAIR     ventral   KNEE ARTHROSCOPY Right    LUMBAR LAMINECTOMY/DECOMPRESSION MICRODISCECTOMY N/A 09/14/2016   Procedure: Decompression L3-5, insitu fusion L4-5;  Surgeon: Burnetta Aures, MD;  Location: [facility];  Service: Orthopedics;  Laterality:  N/A;  3 hrs   LUMBAR LAMINECTOMY/DECOMPRESSION MICRODISCECTOMY N/A 09/17/2016   Procedure: WOUND EXPLORATION, GILL DECOMPRESSION,FAR LATERAL DISCECTOMY LEFT L4-L5, AND EVACUATION OF HEMATOMA;  Surgeon: Burnetta Aures, MD;  Location: [facility];  Service: Orthopedics;  Laterality: N/A;   SPHINCTEROTOMY N/A 02/06/2020   Procedure: SPHINCTEROTOMY;  Surgeon: Golda Claudis PENNER, MD;  Location: [facility];  Service: Endoscopy;  Laterality: N/A;    Current Outpatient Medications  Medication Sig Dispense Refill   allopurinol  (ZYLOPRIM ) 100 MG tablet TAKE 1 TABLET ONCE DAILY FOR GOUT 90 tablet 3   aspirin  EC 81 MG tablet Take 1 tablet (81 mg total) by mouth daily. Swallow whole. 90 tablet 3   atorvastatin  (LIPITOR) 20 MG tablet Take 1 tablet (20 mg total) by mouth daily. 90 tablet 0   glucose blood (ONETOUCH VERIO) test strip Use to test blood sugar daily as directed. DX: E11.65 100 each 12   HYDROcodone -acetaminophen  (NORCO) 7.5-325 MG tablet Take 1 tablet by mouth every 8 (eight) hours as needed for moderate pain (pain score 4-6). 90 tablet 0   [START ON 05/03/2024] HYDROcodone -acetaminophen  (NORCO) 7.5-325 MG tablet Take 1 tablet by mouth every 8 (eight) hours as needed for moderate pain (pain score 4-6). 90 tablet 0   [START ON 05/31/2024] HYDROcodone -acetaminophen  (NORCO) 7.5-325 MG tablet Take 1 tablet by mouth every 8 (eight) hours as needed for moderate pain (pain score 4-6). 90 tablet 0   levocetirizine (XYZAL  ALLERGY 24HR) 5 MG tablet Take 1 tablet (5 mg total) by mouth every evening. 90 tablet 1   lisinopril  (ZESTRIL ) 20 MG tablet Take 1 tablet (20 mg total) by mouth daily. 90 tablet 2   metFORMIN  (GLUCOPHAGE -XR) 500 MG 24 hr tablet TAKE ONE TABLET ONCE DAILY WITH BREAKFAST 90 tablet 2   metoprolol  tartrate (LOPRESSOR ) 25 MG tablet TAKE ONE TABLET EVERY MORNING AND ONE-HALF TABLET EVERY EVENING 135 tablet 0   omeprazole  (PRILOSEC) 40 MG capsule Take 1 capsule (40 mg total) by mouth daily. 90 capsule 3    OneTouch Delica Lancets 30G MISC Use to test blood sugar daily as directed. DX: E11.65 100 each 11   Polyethyl Glycol-Propyl Glycol (SYSTANE OP) Place 1 drop into both eyes 2 (two) times daily as needed (dry eyes).     saline (AYR) GEL Place 1 Application into both nostrils every 4 (four) hours as needed. 14.1 g 1   blood glucose meter kit and supplies Dispense based on patient and insurance preference. Use up to four times daily as directed. (FOR ICD-10 E10.9, E11.9). (Patient not taking: Reported on 04/19/2024) 1 each 0   Blood Glucose Monitoring Suppl (ONETOUCH VERIO FLEX SYSTEM) w/Device KIT Use to test blood sugar daily as directed. DX: E11.65 (Patient not taking: Reported on 04/19/2024) 1 kit 1   Current Facility-Administered Medications  Medication Dose Route Frequency Provider Last Rate Last Admin   cyanocobalamin  (VITAMIN B12) injection 1,000 mcg  1,000 mcg Intramuscular Weekly Lavell Lye A, FNP   1,000 mcg at 09/12/23 1121  Allergies as of 04/19/2024   (No Known Allergies)    Family History  Problem Relation Age of Onset   Heart attack Mother    Heart attack Father    COPD Father    Heart attack Brother    Colon cancer Neg Hx     Review of Systems:    Constitutional: No weight loss, fever, chills, weakness or fatigue HEENT: Eyes: No change in vision               Ears, Nose, Throat:  No change in hearing or congestion Skin: No rash or itching Cardiovascular: No chest pain, chest pressure or palpitations   Respiratory: No SOB or cough Gastrointestinal: See HPI and otherwise negative Genitourinary: No dysuria or change in urinary frequency Neurological: No headache, dizziness or syncope Musculoskeletal: No new muscle or joint pain Hematologic: No bleeding or bruising Psychiatric: No history of depression or anxiety    Physical Exam:  Vital signs: BP (!) 110/58   Pulse 77   Ht 5' 7 (1.702 m)   Wt 211 lb 8 oz (95.9 kg)   BMI 33.13 kg/m   Constitutional:   Pleasant  male appears to be in NAD, Well developed, Well nourished, alert and cooperative Eyes:   PEERL, EOMI. No icterus. Conjunctiva pink. Neck:  Supple Throat: Oral cavity and pharynx without inflammation, swelling or lesion.  Respiratory: Respirations even and unlabored. Lungs clear to auscultation bilaterally.   No wheezes, crackles, or rhonchi.  Cardiovascular: Normal S1, S2. Regular rate and rhythm. No peripheral edema, cyanosis or pallor.  Gastrointestinal:  Soft, nondistended, nontender. No rebound or guarding. Normal bowel sounds. No appreciable masses or hepatomegaly. Rectal:  Not performed.  Msk: in wheelchair  Neurologic:  Alert and  oriented x4;  grossly normal neurologically.  Skin:   Dry and intact without significant lesions or rashes.  RELEVANT LABS AND IMAGING: CBC    Latest Ref Rng & Units 01/02/2024   12:22 PM 07/07/2023   12:04 PM 04/07/2023   11:53 AM  CBC  WBC 3.4 - 10.8 x10E3/uL 8.4  9.8  9.1   Hemoglobin 13.0 - 17.7 g/dL 86.7  85.9  85.4   Hematocrit 37.5 - 51.0 % 41.6  42.9  43.7   Platelets 150 - 450 x10E3/uL 199  243  231      CMP     Latest Ref Rng & Units 04/04/2024   12:33 PM 01/02/2024   12:22 PM 07/07/2023   12:04 PM  CMP  Glucose 70 - 99 mg/dL 866  86  88   BUN 8 - 27 mg/dL 8  11  8    Creatinine 0.76 - 1.27 mg/dL 8.98  8.86  8.79   Sodium 134 - 144 mmol/L 142  141  142   Potassium 3.5 - 5.2 mmol/L 3.9  3.9  4.4   Chloride 96 - 106 mmol/L 103  103  104   CO2 20 - 29 mmol/L 24  25  21    Calcium  8.6 - 10.2 mg/dL 9.1  9.4  9.3   Total Protein 6.0 - 8.5 g/dL 6.3  6.3  6.4   Total Bilirubin 0.0 - 1.2 mg/dL 2.0  2.3  1.5   Alkaline Phos 47 - 123 IU/L 59  61  77   AST 0 - 40 IU/L 24  26  29    ALT 0 - 44 IU/L 17  21  23       Lab Results  Component Value Date   TSH  3.030 07/07/2023   3/22 echo- Left ventricular ejection fraction, by estimation, is 50%.   05/26/20 labs show: Total bili 1.5, direct, bili 0.1, indirect bili 1,4  GI  procedures: 05/2020 ERCP for biliary stent removal a Zelda Salmon - One stent was removed from the biliary tree. - Common hepatic and bile duct measured 7 to 8 mm in diameter. There were no stones but debris removed with balloon stone extractor. - Noncritical distal narrowing felt to be due to location of ampulla within a diverticulum.  01/2020 ERCP for therapy of bile duct stones at [facility] - Gastroduodenitis - Dilated CBD and CHD with multiple filling defects. - Biliary sphincterotomy performed and two stones were removed along with debris but one stone could not be removed. - 10 French 7 cm long plastic biliary stent placed for  decompression.  12/2023 US  Abd RUQ IMPRESSION: Increased hepatic echogenicity which can be seen the setting of hepatic steatosis or hepatocellular disease. Correlate with liver function tests.   Cholecystectomy with mild dilation of common bile duct as expected in post cholecystectomy (reservoir phenomenon). Distal CBD is obscured by bowel gas and obstructive causes can not be completely excluded.   Assessment/Plan: Encounter Diagnoses  Name Primary?   Oropharyngeal dysphagia Yes   Gastroesophageal reflux disease, unspecified whether esophagitis present    Opioid-induced constipation    Hyperbilirubinemia    Special screening for malignant neoplasms, colon    Gilbert's syndrome    Hepatic steatosis      79 year old male patient who presents with main complaint of oropharynegal dysphagia with solids and liquids over the last 3 to 4 weeks.  Patient does have history of GERD but reports it is managed with omeprazole  40 mg p.o. daily.  Records show he had stroke back in 2000 however patient unable to provide any additional information patient presents in wheelchair today reports he typically uses walker or cane depending on how he is feeling.  Patient has chronic back issues and is not eager to pursue any endoscopic procedures.  Will go ahead and start with modified  barium swallow study to rule out motility issues, stricture or web.  Dysphagia precautions given, although patient states he plans to eat what ever he wants.      Patient also has issues with constipation I suspect is likely due to his chronic opioid use and we discussed ways to help improve those symptoms with high-fiber diet and can use over-the-counter MiraLAX  as needed.    Also noted when reviewing lab work patient has hyperbilirubinemia noted since January 2024.  Patient had ERCP in 21 and 22 for bile duct stones.  Laparoscopic cholecystectomy in June 2023.  Patient wife states he recently had CT scan with his PCP to evaluate, will request results.  Normal LFTs.  Normal platelets.  Patient denies any right upper quadrant pain, nausea or vomiting. Will consider further workup when I review imaging.     Lastly patient has never had colon screening colonoscopy, we discussed pursuing colonoscopy versus Cologuard.  Patient would like to avoid endoscopic procedures if he can therefore agrees to do Cologuard.   Oropharyngeal dysphagia , with solids and liquids, hx of stroke 2000? Never had EGD.  -Order Modified Barium swallow study with Speech -if stricture noted consider endoscopic procedures -Dysphagia precautions GERD - Continue omeprazole  40 mg po daily  - Recommend GERD diet , no late meals  Opioid induced constipation -Recommend high fiber diet -Can use OTC Miralax  po daily if needed  4. Hx of Hyperbilirubinemia (since 1/24) 10/25 Abd u/s showed fatty liver, otherwise normal. Seen previously by Dr. Golda who did fractionated bilirubin and more than 90% is indirect implying Gilbert's syndrome (3/22) -Request CTAP imaging from [facility] 5. Fatty liver -recommend Mediterranean diet (not interested in changing diet habits), limited activity level, maintain heathy weight 6. Colon screening, initial -Recommend Cologuard vs colonoscopy , declines colonoscopy. Agrees to [employer]. 7. Paroxysmal  atrial fibrillation-not anticoagulated  Thank you for the courtesy of this consult. Please call me with any questions or concerns.   Arvid Marengo, FNP-C [facility] 04/19/2024, 2:45 PM  Cc: Lavell Bari LABOR, FNP  "

## 2024-04-19 NOTE — Patient Instructions (Addendum)
 Dysphagia Dysphagia precautions  GERD  Continue omeprazole  40 mg po daily Recommend GERD diet  Constipation Recommend high fiber diet Drink plenty of water  Can use OTC Miralax  po daily if needed   Acute Rehab will reach out to you to schedule your Modified Barium Swallow with Tablet. If you haven't heard from their office within a week. You can call their office at [phone].   Your provider has ordered Cologuard testing as an option for colon cancer screening. This is performed by [employer] and Anjulie Dipierro be out of network with your insurance. PRIOR to completing the test, it is YOUR responsibility to contact your insurance about covered benefits for this test. Your out of pocket expense could be anywhere from $0.00 to $649.00.   When you call to check coverage with your insurer, please provide the following information:   -The ONLY provider of Cologuard is [employer], Dispensing  - CPT code for Cologuard is [phone].  [employer] NPI # [id number]  -[employer] Tax ID # [id number]   We have already sent your demographic and insurance information to [employer] (phone number [phone]) and they should contact you within the next week regarding your test. If you have not heard from them within the next week, please call our office at [phone].   _______________________________________________________  If your blood pressure at your visit was 140/90 or greater, please contact your primary care physician to follow up on this.  _______________________________________________________  If you are age 65 or older, your body mass index should be between 23-30. Your Body mass index is 33.13 kg/m. If this is out of the aforementioned range listed, please consider follow up with your Primary Care Provider.  If you are age 67 or younger, your body mass index should be between 19-25. Your Body mass index is 33.13 kg/m. If this is out of the  aformentioned range listed, please consider follow up with your Primary Care Provider.   ________________________________________________________  The [facility] providers would like to encourage you to use MYCHART to communicate with providers for non-urgent requests or questions.  Due to long hold times on the telephone, sending your provider a message by [facility] Madie Cahn be a faster and more efficient way to get a response.  Please allow 48 business hours for a response.  Please remember that this is for non-urgent requests.  _______________________________________________________  [facility] is using a team-based approach to care.  Your team is made up of your doctor and two to three APPS. Our APPS (Nurse Practitioners and Physician Assistants) work with your physician to ensure care continuity for you. They are fully qualified to address your health concerns and develop a treatment plan. They communicate directly with your gastroenterologist to care for you. Seeing the Advanced Practice Practitioners on your physician's team can help you by facilitating care more promptly, often allowing for earlier appointments, access to diagnostic testing, procedures, and other specialty referrals.   Thank you for trusting me with your gastrointestinal care. Zacari Radick, FNP-C

## 2024-04-23 ENCOUNTER — Other Ambulatory Visit (HOSPITAL_COMMUNITY): Payer: Self-pay | Admitting: Gastroenterology

## 2024-04-23 DIAGNOSIS — R1310 Dysphagia, unspecified: Secondary | ICD-10-CM

## 2024-04-23 DIAGNOSIS — R059 Cough, unspecified: Secondary | ICD-10-CM

## 2024-04-24 ENCOUNTER — Ambulatory Visit (HOSPITAL_COMMUNITY)
Admission: RE | Admit: 2024-04-24 | Discharge: 2024-04-24 | Disposition: A | Discharge status: Home / Self Care | Source: Ambulatory Visit | Attending: Family | Admitting: Family

## 2024-04-24 ENCOUNTER — Inpatient Hospital Stay (HOSPITAL_COMMUNITY): Admission: RE | Admit: 2024-04-24 | Discharge: 2024-04-24 | Attending: Family | Admitting: Family

## 2024-04-24 ENCOUNTER — Ambulatory Visit (HOSPITAL_COMMUNITY)
Admission: RE | Admit: 2024-04-24 | Discharge: 2024-04-24 | Disposition: A | Source: Ambulatory Visit | Attending: Gastroenterology

## 2024-04-24 ENCOUNTER — Other Ambulatory Visit: Payer: Self-pay

## 2024-04-24 DIAGNOSIS — K224 Dyskinesia of esophagus: Secondary | ICD-10-CM | POA: Diagnosis not present

## 2024-04-24 DIAGNOSIS — R059 Cough, unspecified: Secondary | ICD-10-CM | POA: Insufficient documentation

## 2024-04-24 DIAGNOSIS — R1312 Dysphagia, oropharyngeal phase: Secondary | ICD-10-CM

## 2024-04-24 DIAGNOSIS — K222 Esophageal obstruction: Secondary | ICD-10-CM | POA: Insufficient documentation

## 2024-04-24 DIAGNOSIS — K219 Gastro-esophageal reflux disease without esophagitis: Secondary | ICD-10-CM | POA: Insufficient documentation

## 2024-04-24 DIAGNOSIS — R1310 Dysphagia, unspecified: Secondary | ICD-10-CM

## 2024-04-24 NOTE — Progress Notes (Signed)
 Modified Barium Swallow Study  Patient Details  Name: Daniel Short MRN: [id number] Date of Birth: 04/20/1945  Today's Date: 04/24/2024  Modified Barium Swallow completed.  Full report located under Chart Review in the Imaging Section.  History of Present Illness Patient is a  79  year old male patient with past medical history of CAD, paroxysmal atrial fibrillation, hyperlipidemia, hypertension, prior history of subarachnoid hemorrhage and hemorrhagic stroke. Referred by GI MD for complaints of coughing with both liquids and solids. Patient with c/o globus and regurgitation, runny nose and post nasal drip. Currently on meds for GER.   Clinical Impression Patient presents with an age appropriate, functional oropharyngeal swallow. Oral phase normal for mastication and clearance of bolus with consistent peicemeal manipulation of liquids and solids. Swallow is initiated at the level of the vallecula and patient with trace vallecular coating post swallow. Both oral and pharyngeal residuals clear with spontaneous second swallow. Towards end of study, patient began to complain of feeling of potential regurgitation, expectorating a small amount of what appears to be mucous/saliva mixed with barium. Did not test pill given discomfort in general and patient with plans to have esophagram following this test. Esophageal sweep revealed what appeared to be a significant amoun of barium in lower esophagus. Radiologist who will complete esophagram present for study. Factors that may increase risk of adverse event in presence of aspiration Noe & Lianne 2021): Limited mobility  Swallow Evaluation Recommendations Recommendations: PO diet PO Diet Recommendation: Regular;Thin liquids (Level 0) (unless otherwise specified by radiologist following esophagram) Liquid Administration via: Cup;Straw Medication Administration:  (per esophagram results) Supervision: Patient able to self-feed Swallowing strategies   : Slow rate;Small bites/sips;Follow solids with liquids Postural changes: Stay upright 30-60 min after meals;Position pt fully upright for meals Oral care recommendations: Oral care BID (2x/day)     Silver Parkey MA, CCC-SLP  Dameisha Tschida Meryl 04/24/2024,12:05 PM

## 2024-04-25 ENCOUNTER — Ambulatory Visit: Payer: Self-pay | Admitting: Gastroenterology

## 2024-04-26 ENCOUNTER — Telehealth: Payer: Self-pay | Admitting: Gastroenterology

## 2024-04-26 ENCOUNTER — Other Ambulatory Visit: Payer: Self-pay

## 2024-04-26 ENCOUNTER — Telehealth: Payer: Self-pay

## 2024-04-26 DIAGNOSIS — K219 Gastro-esophageal reflux disease without esophagitis: Secondary | ICD-10-CM

## 2024-04-26 DIAGNOSIS — R1310 Dysphagia, unspecified: Secondary | ICD-10-CM

## 2024-04-26 DIAGNOSIS — R634 Abnormal weight loss: Secondary | ICD-10-CM

## 2024-04-26 DIAGNOSIS — K222 Esophageal obstruction: Secondary | ICD-10-CM

## 2024-04-26 NOTE — Telephone Encounter (Signed)
 Called patient and wife to discuss results of barium esophagram.  It shows patient has active MyChart however patient patient's wife states he is unable to use it.  We discussed findings of esophageal stricture with recommendations for EGD.  They agreed to plan.  Patient's wife does share with me that when they went to [facility] to do swallow study that patient was unable to stand up out of wheelchair due to generalized weakness. They had to do the test with him in the wheelchair.  Due to this I think it would be best to schedule the procedure at the hospital in case he is unable to assist himself to the stretcher in [facility].  Patient's wife verbalizes understanding.

## 2024-04-26 NOTE — Telephone Encounter (Signed)
-----   Message from [facility] May, NP sent at 04/26/2024 11:07 AM EST ----- Pod B- Patient needs EGD with dilatation first available at hospital with Dr Federico for  DX: esophageal stricture, GERD, dysphagia, weight loss  Have to schedule with his wife [phone]  Has to be done at hospital because patient is weak and cannot always ambulate well.  Deanna, NP

## 2024-05-23 ENCOUNTER — Ambulatory Visit (HOSPITAL_COMMUNITY): Admit: 2024-05-23 | Admitting: Internal Medicine

## 2024-05-23 ENCOUNTER — Encounter (HOSPITAL_COMMUNITY): Payer: Self-pay
